# Patient Record
Sex: Male | Born: 1935 | Race: White | Hispanic: No | Smoking: Never smoker
Health system: Southern US, Community
[De-identification: ages and names within clinical notes are randomized; demographics above are authoritative.]

## PROBLEM LIST (undated history)

## (undated) DIAGNOSIS — N400 Enlarged prostate without lower urinary tract symptoms: Secondary | ICD-10-CM

## (undated) DIAGNOSIS — I959 Hypotension, unspecified: Secondary | ICD-10-CM

## (undated) DIAGNOSIS — I2510 Atherosclerotic heart disease of native coronary artery without angina pectoris: Secondary | ICD-10-CM

## (undated) DIAGNOSIS — C4490 Unspecified malignant neoplasm of skin, unspecified: Secondary | ICD-10-CM

## (undated) DIAGNOSIS — N200 Calculus of kidney: Secondary | ICD-10-CM

## (undated) DIAGNOSIS — J449 Chronic obstructive pulmonary disease, unspecified: Secondary | ICD-10-CM

## (undated) DIAGNOSIS — E039 Hypothyroidism, unspecified: Secondary | ICD-10-CM

## (undated) DIAGNOSIS — L989 Disorder of the skin and subcutaneous tissue, unspecified: Secondary | ICD-10-CM

## (undated) DIAGNOSIS — D649 Anemia, unspecified: Secondary | ICD-10-CM

## (undated) DIAGNOSIS — J45909 Unspecified asthma, uncomplicated: Secondary | ICD-10-CM

## (undated) HISTORY — PX: APPENDECTOMY: SHX54

## (undated) HISTORY — DX: Calculus of kidney: N20.0

## (undated) HISTORY — PX: MOHS SURGERY: SUR867

## (undated) HISTORY — PX: TONSILLECTOMY: SUR1361

## (undated) HISTORY — PX: BONE RESECTION: SHX897

---

## 2006-04-17 ENCOUNTER — Ambulatory Visit: Payer: Self-pay

## 2006-05-11 ENCOUNTER — Emergency Department: Payer: Self-pay | Admitting: Emergency Medicine

## 2006-05-12 ENCOUNTER — Other Ambulatory Visit: Payer: Self-pay

## 2006-05-12 ENCOUNTER — Ambulatory Visit: Payer: Self-pay | Admitting: Emergency Medicine

## 2006-12-05 ENCOUNTER — Ambulatory Visit: Payer: Self-pay

## 2007-01-01 ENCOUNTER — Ambulatory Visit: Payer: Self-pay | Admitting: Specialist

## 2007-07-17 ENCOUNTER — Ambulatory Visit: Payer: Self-pay

## 2007-09-04 ENCOUNTER — Ambulatory Visit: Payer: Self-pay | Admitting: Family Medicine

## 2008-02-03 ENCOUNTER — Ambulatory Visit: Payer: Self-pay | Admitting: Unknown Physician Specialty

## 2008-11-16 ENCOUNTER — Ambulatory Visit: Payer: Self-pay | Admitting: Internal Medicine

## 2008-12-12 ENCOUNTER — Emergency Department: Payer: Self-pay | Admitting: Emergency Medicine

## 2009-01-24 ENCOUNTER — Ambulatory Visit: Payer: Self-pay | Admitting: Emergency Medicine

## 2009-02-21 ENCOUNTER — Ambulatory Visit: Payer: Self-pay | Admitting: Internal Medicine

## 2010-07-11 ENCOUNTER — Emergency Department: Payer: Self-pay | Admitting: Emergency Medicine

## 2010-07-12 ENCOUNTER — Emergency Department: Payer: Self-pay | Admitting: Emergency Medicine

## 2010-09-02 ENCOUNTER — Emergency Department: Payer: Self-pay | Admitting: Emergency Medicine

## 2010-12-07 ENCOUNTER — Ambulatory Visit: Payer: Self-pay | Admitting: Unknown Physician Specialty

## 2010-12-17 ENCOUNTER — Emergency Department: Payer: Self-pay | Admitting: Emergency Medicine

## 2011-09-23 ENCOUNTER — Ambulatory Visit: Payer: Self-pay | Admitting: Unknown Physician Specialty

## 2011-09-25 LAB — PATHOLOGY REPORT

## 2012-02-26 ENCOUNTER — Ambulatory Visit: Payer: Self-pay | Admitting: Neurology

## 2012-04-28 ENCOUNTER — Ambulatory Visit: Payer: Self-pay | Admitting: Internal Medicine

## 2012-12-23 ENCOUNTER — Ambulatory Visit: Payer: Self-pay | Admitting: Internal Medicine

## 2013-01-27 LAB — COMPREHENSIVE METABOLIC PANEL
Alkaline Phosphatase: 55 U/L (ref 50–136)
BUN: 27 mg/dL — ABNORMAL HIGH (ref 7–18)
Bilirubin,Total: 0.5 mg/dL (ref 0.2–1.0)
Calcium, Total: 8.4 mg/dL — ABNORMAL LOW (ref 8.5–10.1)
EGFR (African American): 32 — ABNORMAL LOW
Glucose: 127 mg/dL — ABNORMAL HIGH (ref 65–99)
Osmolality: 279 (ref 275–301)
SGOT(AST): 81 U/L — ABNORMAL HIGH (ref 15–37)
SGPT (ALT): 52 U/L (ref 12–78)
Sodium: 136 mmol/L (ref 136–145)
Total Protein: 7.5 g/dL (ref 6.4–8.2)

## 2013-01-27 LAB — URINALYSIS, COMPLETE
Bilirubin,UR: NEGATIVE
Blood: NEGATIVE
Glucose,UR: NEGATIVE mg/dL (ref 0–75)
Hyaline Cast: 13
Ketone: NEGATIVE
Protein: 30
RBC,UR: 2 /HPF (ref 0–5)
Specific Gravity: 1.019 (ref 1.003–1.030)
WBC UR: 4 /HPF (ref 0–5)

## 2013-01-27 LAB — CBC
HGB: 11.5 g/dL — ABNORMAL LOW (ref 13.0–18.0)
MCHC: 32.2 g/dL (ref 32.0–36.0)
Platelet: 158 10*3/uL (ref 150–440)

## 2013-01-27 LAB — TROPONIN I: Troponin-I: <0.02 ng/mL

## 2013-01-28 ENCOUNTER — Inpatient Hospital Stay: Payer: Self-pay | Admitting: Internal Medicine

## 2013-01-28 LAB — PRO B NATRIURETIC PEPTIDE: B-Type Natriuretic Peptide: 508 pg/mL — ABNORMAL HIGH (ref 0–450)

## 2013-01-28 LAB — CK TOTAL AND CKMB (NOT AT ARMC)
CK, Total: 1069 U/L — ABNORMAL HIGH (ref 35–232)
CK-MB: 5.5 ng/mL — ABNORMAL HIGH (ref 0.5–3.6)

## 2013-01-28 LAB — TROPONIN I: Troponin-I: <0.02 ng/mL

## 2013-01-29 LAB — CREATININE, SERUM
Creatinine: 1.47 mg/dL — ABNORMAL HIGH
EGFR (African American): 52 — ABNORMAL LOW
EGFR (Non-African Amer.): 45 — ABNORMAL LOW

## 2013-01-29 LAB — CBC WITH DIFFERENTIAL/PLATELET
Basophil #: 0.1 10*3/uL (ref 0.0–0.1)
Eosinophil %: 10.6 %
HCT: 34.6 % — ABNORMAL LOW (ref 40.0–52.0)
HGB: 11.4 g/dL — ABNORMAL LOW (ref 13.0–18.0)
Lymphocyte %: 9.4 %
MCH: 32.3 pg (ref 26.0–34.0)
MCHC: 32.9 g/dL (ref 32.0–36.0)
Monocyte #: 0.6 x10 3/mm (ref 0.2–1.0)
Neutrophil #: 4.2 10*3/uL (ref 1.4–6.5)
Neutrophil %: 69.7 %
Platelet: 148 10*3/uL — ABNORMAL LOW (ref 150–440)
WBC: 6.1 10*3/uL (ref 3.8–10.6)

## 2013-01-29 LAB — BASIC METABOLIC PANEL
Anion Gap: 6 — ABNORMAL LOW (ref 7–16)
Calcium, Total: 8.5 mg/dL (ref 8.5–10.1)
Chloride: 105 mmol/L (ref 98–107)
EGFR (Non-African Amer.): 43 — ABNORMAL LOW
Glucose: 98 mg/dL (ref 65–99)
Osmolality: 279 (ref 275–301)

## 2013-01-29 LAB — PROTIME-INR: Prothrombin Time: 14.3 secs (ref 11.5–14.7)

## 2013-01-30 LAB — CBC WITH DIFFERENTIAL/PLATELET
Basophil #: 0 10*3/uL (ref 0.0–0.1)
Basophil %: 0.2 %
Eosinophil #: 0 10*3/uL (ref 0.0–0.7)
Eosinophil %: 0.1 %
HCT: 34.8 % — ABNORMAL LOW (ref 40.0–52.0)
HGB: 11.2 g/dL — ABNORMAL LOW (ref 13.0–18.0)
Lymphocyte %: 8.6 %
MCHC: 32.3 g/dL (ref 32.0–36.0)
MCV: 98 fL (ref 80–100)
Monocyte #: 0.1 x10 3/mm — ABNORMAL LOW (ref 0.2–1.0)
Neutrophil #: 4.1 10*3/uL (ref 1.4–6.5)
Neutrophil %: 89.1 %
Platelet: 142 10*3/uL — ABNORMAL LOW (ref 150–440)
RDW: 16.7 % — ABNORMAL HIGH (ref 11.5–14.5)

## 2013-01-30 LAB — BASIC METABOLIC PANEL
Chloride: 104 mmol/L (ref 98–107)
EGFR (Non-African Amer.): 52 — ABNORMAL LOW
Osmolality: 281 (ref 275–301)
Potassium: 4.3 mmol/L (ref 3.5–5.1)

## 2013-03-08 ENCOUNTER — Inpatient Hospital Stay: Payer: Self-pay | Admitting: Internal Medicine

## 2013-03-08 LAB — CBC
HCT: 33.6 % — ABNORMAL LOW (ref 40.0–52.0)
MCH: 33.1 pg (ref 26.0–34.0)
MCHC: 33.8 g/dL (ref 32.0–36.0)
MCV: 98 fL (ref 80–100)
Platelet: 152 10*3/uL (ref 150–440)
RBC: 3.42 10*6/uL — ABNORMAL LOW (ref 4.40–5.90)
RDW: 15.4 % — ABNORMAL HIGH (ref 11.5–14.5)

## 2013-03-08 LAB — COMPREHENSIVE METABOLIC PANEL
Albumin: 3.9 g/dL (ref 3.4–5.0)
Alkaline Phosphatase: 59 U/L (ref 50–136)
Anion Gap: 6 — ABNORMAL LOW (ref 7–16)
BUN: 26 mg/dL — ABNORMAL HIGH (ref 7–18)
Calcium, Total: 8.1 mg/dL — ABNORMAL LOW (ref 8.5–10.1)
Co2: 30 mmol/L (ref 21–32)
Creatinine: 1.93 mg/dL — ABNORMAL HIGH (ref 0.60–1.30)
EGFR (African American): 38 — ABNORMAL LOW
EGFR (Non-African Amer.): 32 — ABNORMAL LOW
Osmolality: 280 (ref 275–301)
Sodium: 138 mmol/L (ref 136–145)

## 2013-03-08 LAB — TROPONIN I: Troponin-I: <0.02 ng/mL

## 2013-03-09 LAB — URINALYSIS, COMPLETE
Bacteria: NONE SEEN
Blood: NEGATIVE
Protein: NEGATIVE
RBC,UR: 1 /HPF (ref 0–5)
Squamous Epithelial: NONE SEEN
WBC UR: <1 /HPF (ref 0–5)

## 2013-03-09 LAB — BASIC METABOLIC PANEL
BUN: 22 mg/dL — ABNORMAL HIGH (ref 7–18)
Calcium, Total: 8.2 mg/dL — ABNORMAL LOW (ref 8.5–10.1)
Co2: 29 mmol/L (ref 21–32)
EGFR (African American): 49 — ABNORMAL LOW
Glucose: 75 mg/dL (ref 65–99)
Osmolality: 278 (ref 275–301)
Sodium: 138 mmol/L (ref 136–145)

## 2013-03-09 LAB — CK TOTAL AND CKMB (NOT AT ARMC)
CK, Total: 2295 U/L — ABNORMAL HIGH (ref 35–232)
CK-MB: 16.3 ng/mL — ABNORMAL HIGH (ref 0.5–3.6)

## 2013-03-09 LAB — CK-MB: CK-MB: 16.3 ng/mL — ABNORMAL HIGH (ref 0.5–3.6)

## 2013-03-10 LAB — CK: CK, Total: 1830 U/L — ABNORMAL HIGH (ref 35–232)

## 2013-03-10 LAB — BASIC METABOLIC PANEL
Anion Gap: 8 (ref 7–16)
Calcium, Total: 7.8 mg/dL — ABNORMAL LOW (ref 8.5–10.1)
Chloride: 107 mmol/L (ref 98–107)
EGFR (African American): >60
Glucose: 73 mg/dL (ref 65–99)
Osmolality: 281 (ref 275–301)
Potassium: 4.3 mmol/L (ref 3.5–5.1)
Sodium: 141 mmol/L (ref 136–145)

## 2013-03-10 LAB — CBC WITH DIFFERENTIAL/PLATELET
Basophil #: 0.1 10*3/uL (ref 0.0–0.1)
Basophil %: 2.4 %
Eosinophil %: 13.8 %
HCT: 32.9 % — ABNORMAL LOW (ref 40.0–52.0)
Lymphocyte #: 1.2 10*3/uL (ref 1.0–3.6)
MCH: 32.3 pg (ref 26.0–34.0)
MCHC: 32.7 g/dL (ref 32.0–36.0)
Monocyte #: 0.2 x10 3/mm (ref 0.2–1.0)
Monocyte %: 6.8 %
Platelet: 139 10*3/uL — ABNORMAL LOW (ref 150–440)
RBC: 3.34 10*6/uL — ABNORMAL LOW (ref 4.40–5.90)

## 2013-03-11 LAB — CBC WITH DIFFERENTIAL/PLATELET
Basophil #: 0.1 10*3/uL (ref 0.0–0.1)
Basophil %: 2.3 %
Eosinophil #: 0.6 10*3/uL (ref 0.0–0.7)
Eosinophil %: 13.9 %
HCT: 35.5 % — ABNORMAL LOW (ref 40.0–52.0)
Lymphocyte %: 37 %
MCH: 34 pg (ref 26.0–34.0)
MCHC: 33.6 g/dL (ref 32.0–36.0)
Monocyte #: 0.3 x10 3/mm (ref 0.2–1.0)
Monocyte %: 6.2 %
Neutrophil %: 40.6 %
Platelet: 147 10*3/uL — ABNORMAL LOW (ref 150–440)
RDW: 15.2 % — ABNORMAL HIGH (ref 11.5–14.5)

## 2013-03-12 LAB — CK: CK, Total: 1678 U/L — ABNORMAL HIGH (ref 35–232)

## 2013-03-15 ENCOUNTER — Emergency Department: Payer: Self-pay | Admitting: Emergency Medicine

## 2013-04-15 ENCOUNTER — Emergency Department: Payer: Self-pay | Admitting: Emergency Medicine

## 2013-05-21 IMAGING — CR DG CHEST 1V PORT
1 series · 2 of 2 positions shown · non-contrast
Comparison: none

REASON FOR EXAM: dyspnea
COMMENTS:

PROCEDURE:     DXR - DXR PORTABLE CHEST SINGLE VIEW  - March 08, 2013  [DATE]
RESULT:     Comparison: 01/27/2013

[Series 1: ap · 0.17mm/px · 2 of 2 slices shown]
[im 1/2]
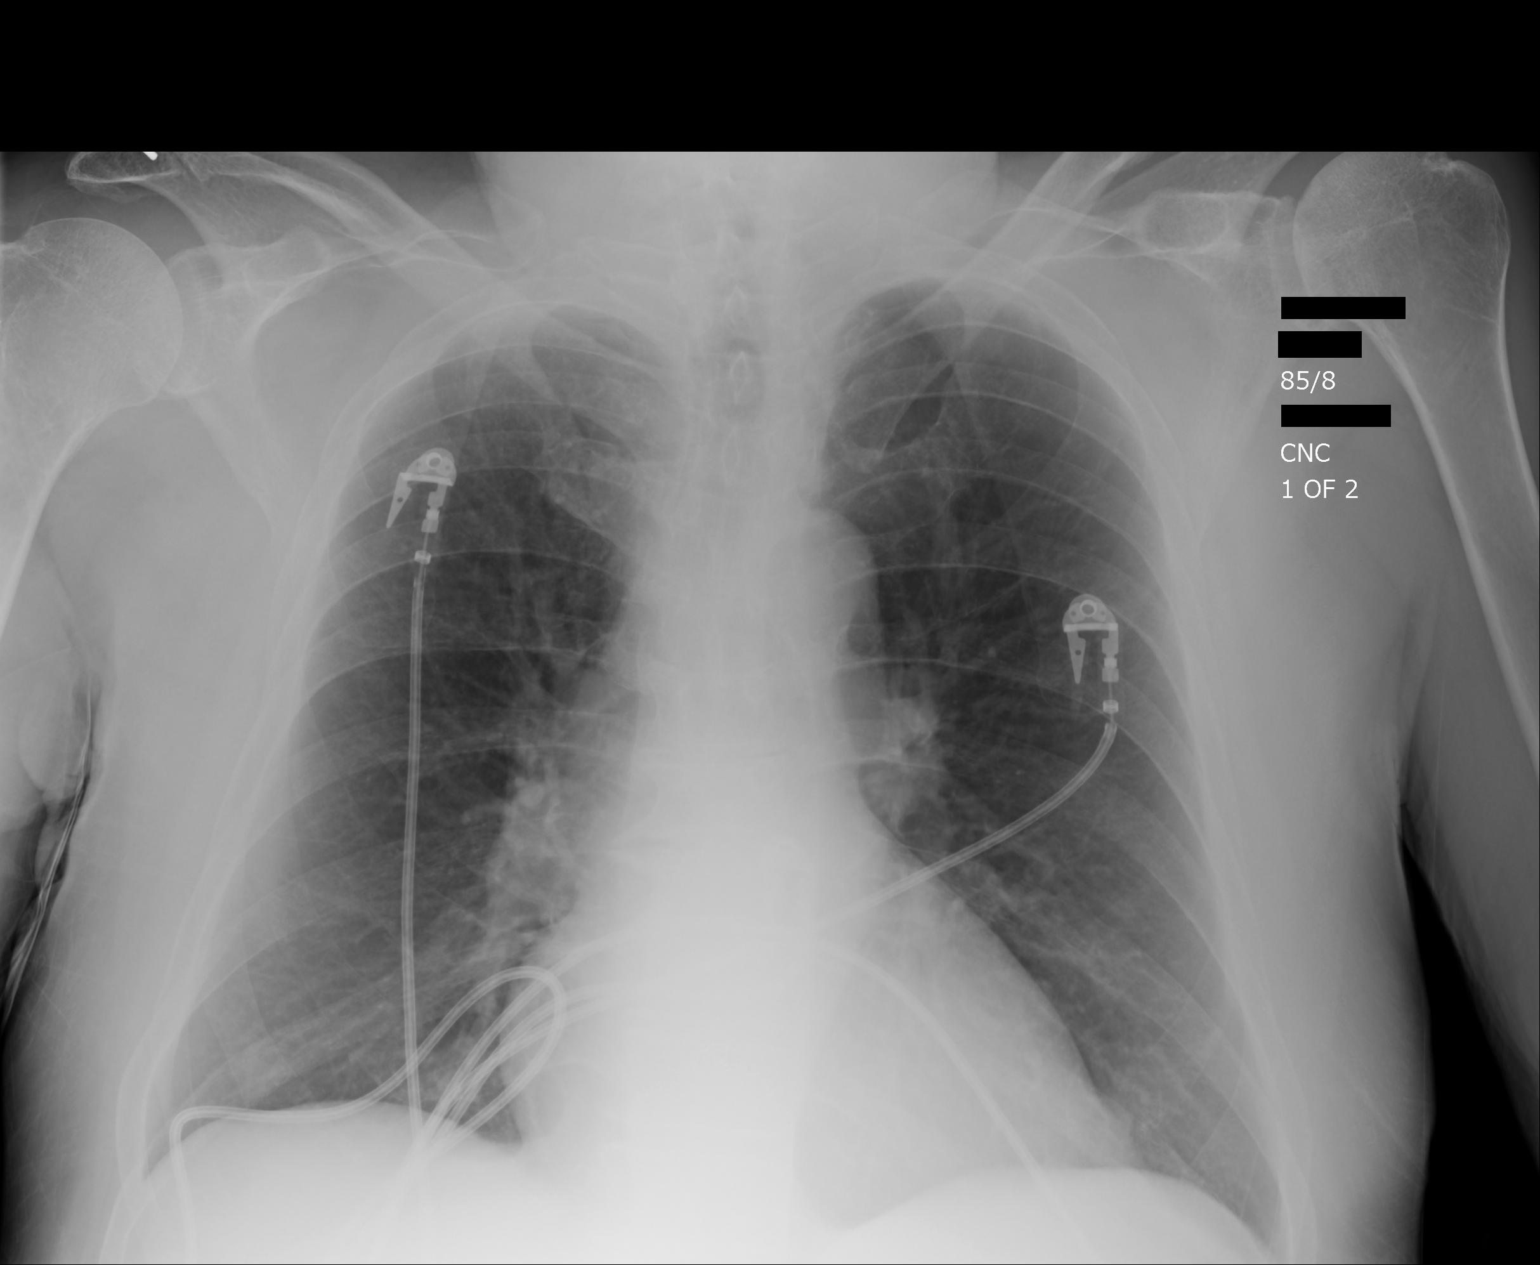
[im 2/2]
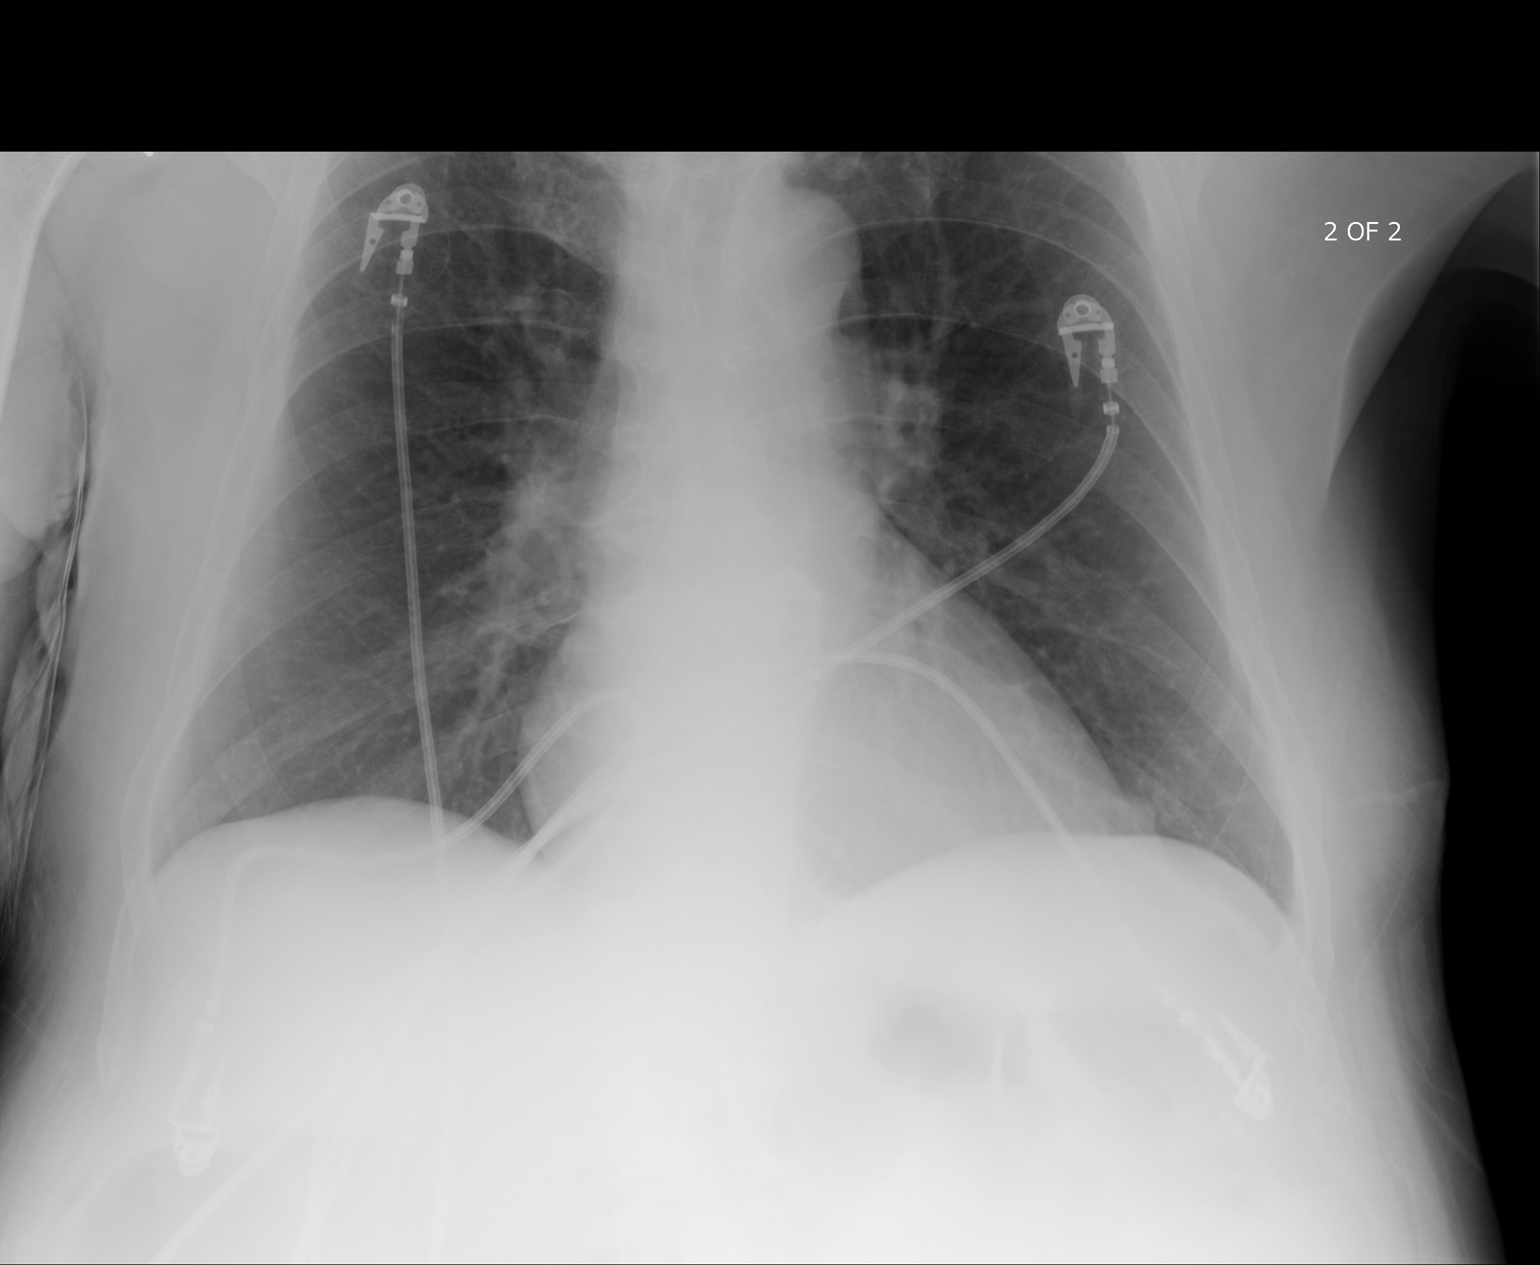

[2 of 2 positions shown; findings below may reference images not displayed]

FINDINGS: Single portable AP chest radiograph is provided.  There is no focal
parenchymal opacity, pleural effusion, or pneumothorax. Normal
cardiomediastinal silhouette. The osseous structures are unremarkable.
IMPRESSION: No acute disease of the che[REDACTED]

## 2013-06-17 ENCOUNTER — Ambulatory Visit: Payer: Self-pay | Admitting: Hematology and Oncology

## 2013-06-29 IMAGING — CT CT HEAD WITHOUT CONTRAST
2 series · 15 of 30 positions shown, 19 images · non-contrast
Comparison: none

REASON FOR EXAM: head trauma
COMMENTS:

PROCEDURE:     CT  - CT HEAD WITHOUT CONTRAST  - April 16, 2013  [DATE]
RESULT:     Comparison:  03/08/2013
TECHNIQUE: Multiple axial images from the foramen magnum to the vertex were
obtained without IV contrast.

[Series 2: without · axial · non-contrast · 0.44mm/px · z∈[-88,+38]mm · 13 of 31 slices shown, 17 images]
[im 3/31  brain]
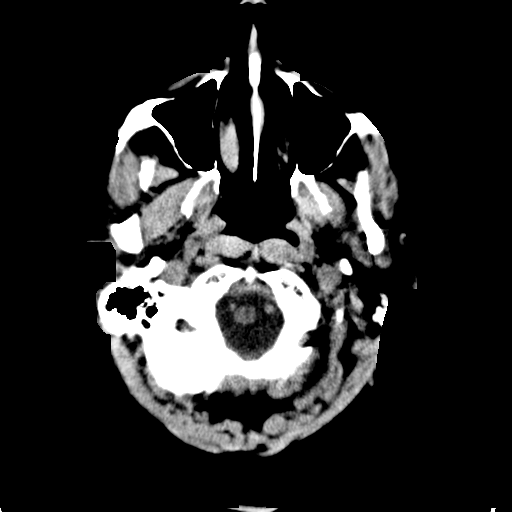
[im 3/31  bone]
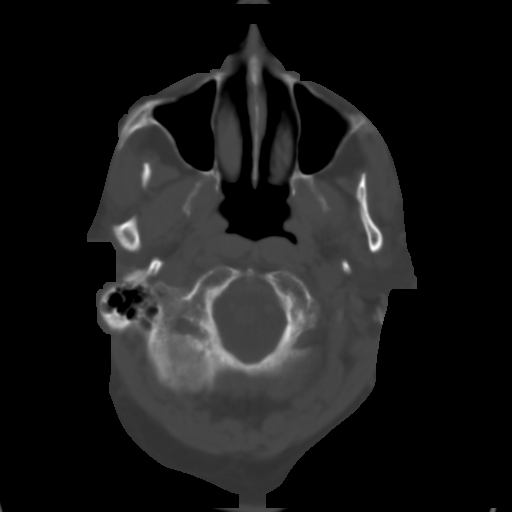
[im 5/31  brain]
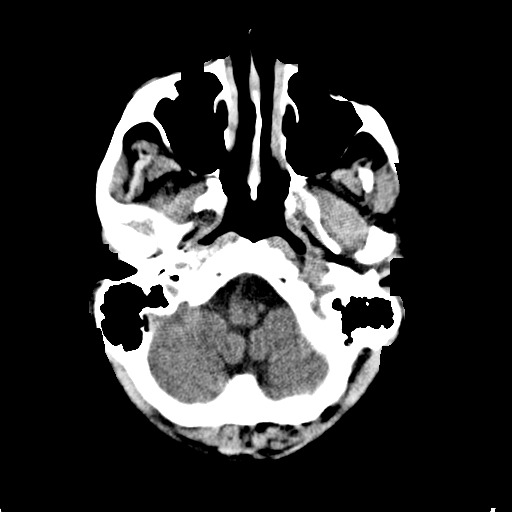
[im 7/31  brain]
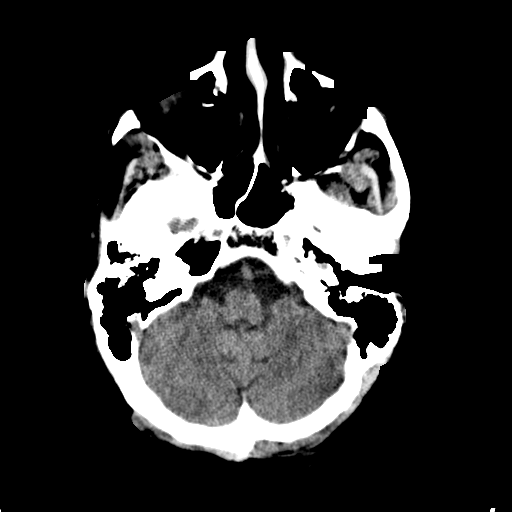
[im 9/31  brain]
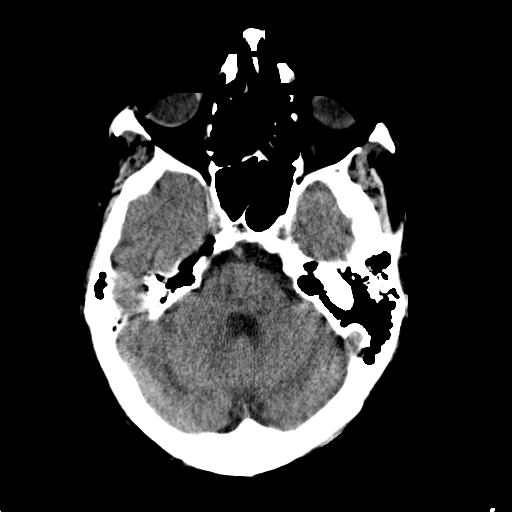
[im 11/31  brain]
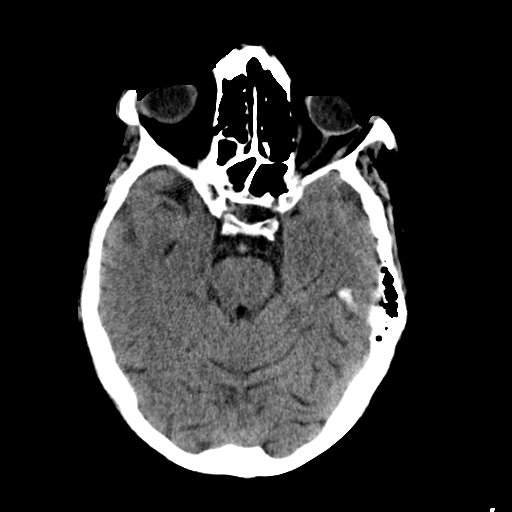
[im 11/31  bone]
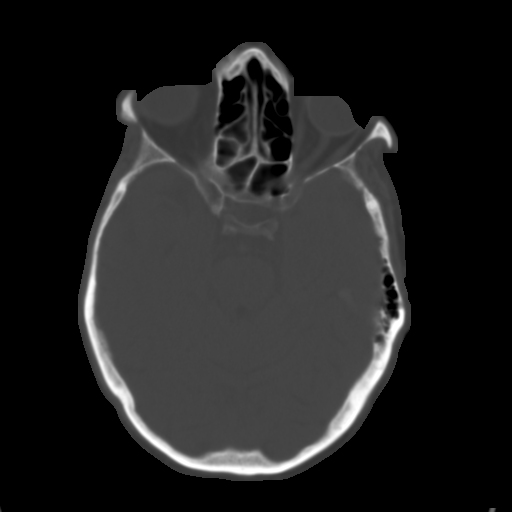
[im 13/31  brain]
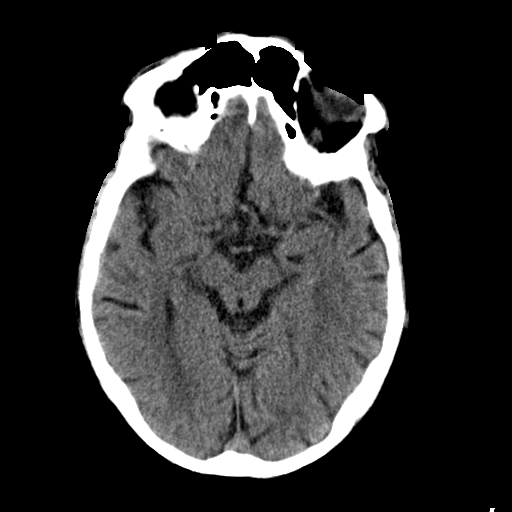
[im 16/31  brain]
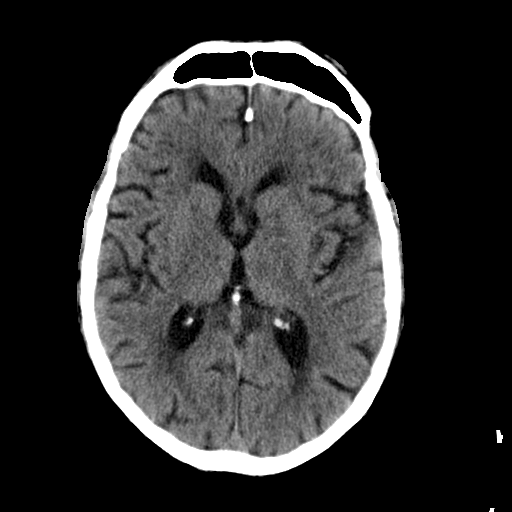
[im 18/31  brain]
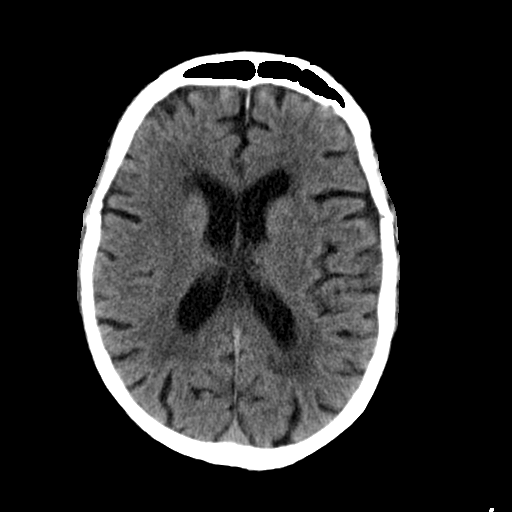
[im 20/31  brain]
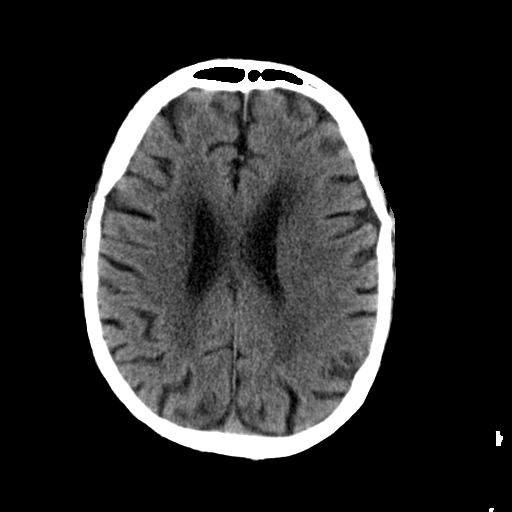
[im 20/31  bone]
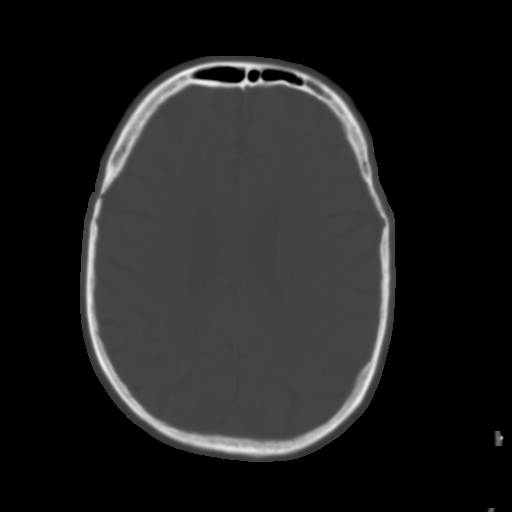
[im 22/31  brain]
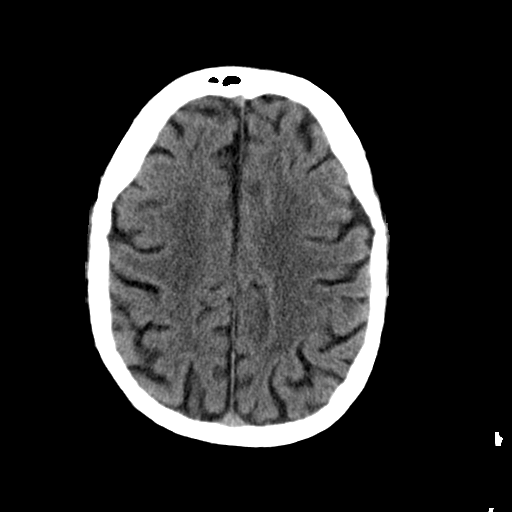
[im 24/31  brain]
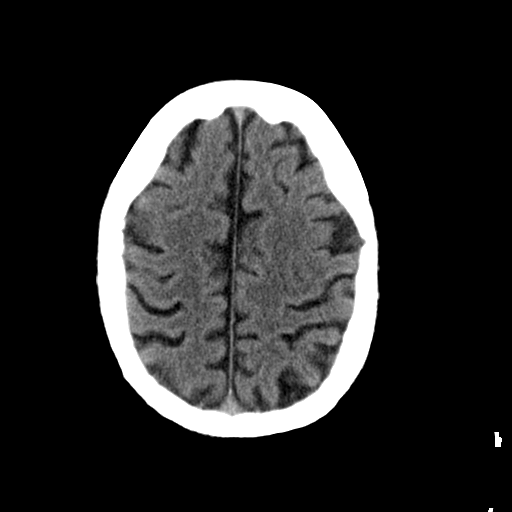
[im 26/31  brain]
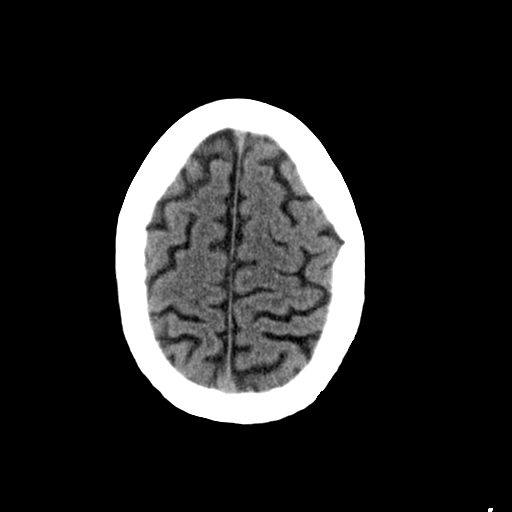
[im 28/31  brain]
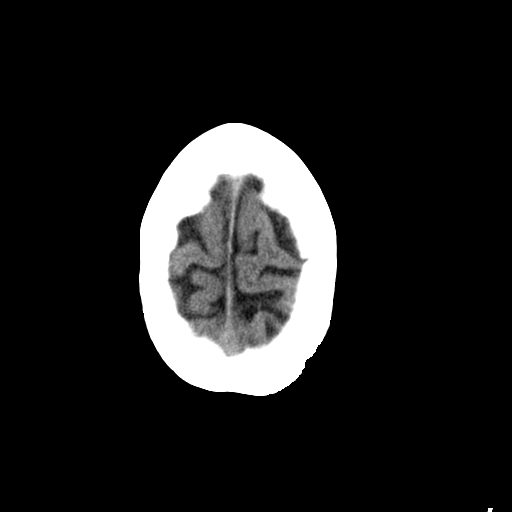
[im 28/31  bone]
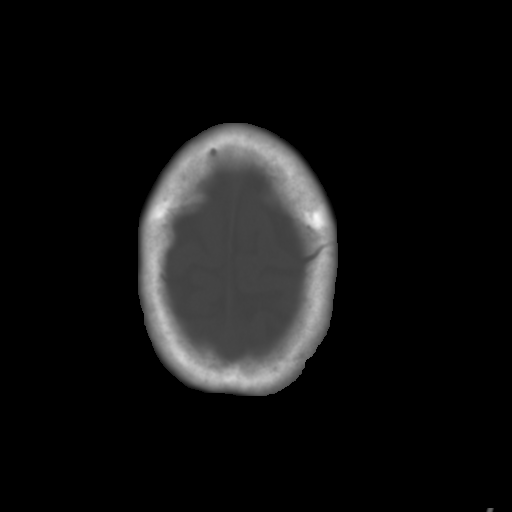

[Series 3: bone · axial · 0.44mm/px · z∈[-88,-68]mm · 2 of 31 slices shown]
[im 3/31  bone]
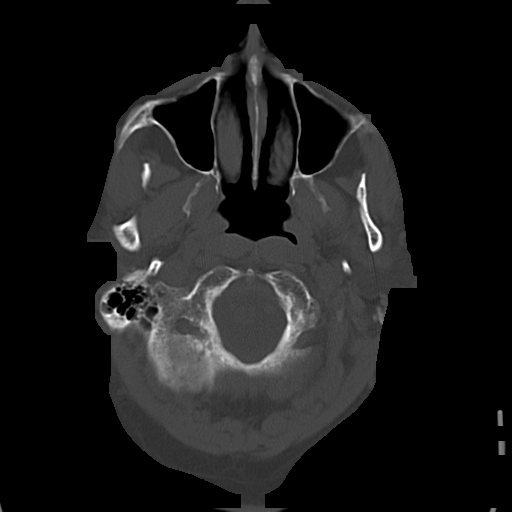
[im 7/31  bone]
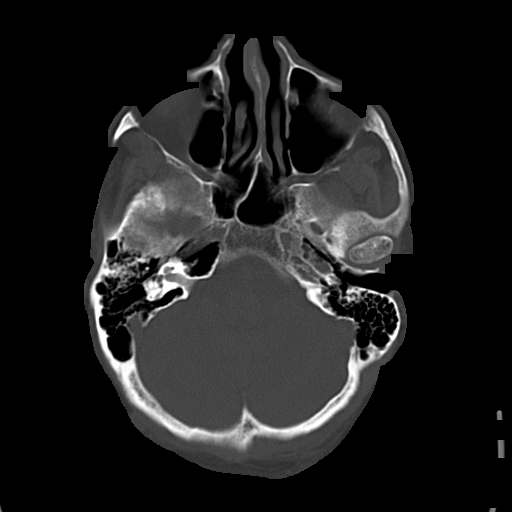

[15 of 30 positions shown; findings below may reference images not displayed]

FINDINGS: There is no evidence of mass effect, midline shift, or extra-axial fluid
collections.  There is no evidence of a space-occupying lesion or
intracranial hemorrhage. There is no evidence of a cortical-based area of
acute infarction. There is generalized cerebral atrophy. There is
periventricular white matter low attenuation likely secondary to
microangiopathy.

The ventricles and sulci are appropriate for the patient's age. The basal
cisterns are patent.

Visualized portions of the orbits are unremarkable. The visualized portions
of the paranasal sinuses and mastoid air cells are unremarkable.
Cerebrovascular atherosclerotic calcifications are noted.

The osseous structures are unremarkable.
IMPRESSION: No acute intracranial process.

[REDACTED]

## 2013-09-13 ENCOUNTER — Encounter (INDEPENDENT_AMBULATORY_CARE_PROVIDER_SITE_OTHER): Payer: Medicare Other | Admitting: Ophthalmology

## 2013-09-13 DIAGNOSIS — D3130 Benign neoplasm of unspecified choroid: Secondary | ICD-10-CM

## 2013-09-13 DIAGNOSIS — I10 Essential (primary) hypertension: Secondary | ICD-10-CM

## 2013-09-13 DIAGNOSIS — H35039 Hypertensive retinopathy, unspecified eye: Secondary | ICD-10-CM

## 2013-09-13 DIAGNOSIS — H2510 Age-related nuclear cataract, unspecified eye: Secondary | ICD-10-CM

## 2013-09-13 DIAGNOSIS — H43819 Vitreous degeneration, unspecified eye: Secondary | ICD-10-CM

## 2013-10-20 ENCOUNTER — Encounter: Payer: Self-pay | Admitting: Neurology

## 2013-10-26 ENCOUNTER — Ambulatory Visit: Payer: Self-pay | Admitting: Neurology

## 2013-11-08 ENCOUNTER — Encounter: Payer: Self-pay | Admitting: Neurology

## 2013-12-09 ENCOUNTER — Encounter: Payer: Self-pay | Admitting: Neurology

## 2014-01-18 ENCOUNTER — Emergency Department: Payer: Self-pay | Admitting: Emergency Medicine

## 2014-01-18 LAB — COMPREHENSIVE METABOLIC PANEL
ALT: 37 U/L (ref 12–78)
ANION GAP: 4 — AB (ref 7–16)
Albumin: 3.6 g/dL (ref 3.4–5.0)
Alkaline Phosphatase: 74 U/L
BILIRUBIN TOTAL: 0.6 mg/dL (ref 0.2–1.0)
BUN: 23 mg/dL — ABNORMAL HIGH (ref 7–18)
CREATININE: 1.25 mg/dL (ref 0.60–1.30)
Calcium, Total: 8.4 mg/dL — ABNORMAL LOW (ref 8.5–10.1)
Chloride: 104 mmol/L (ref 98–107)
Co2: 31 mmol/L (ref 21–32)
GFR CALC NON AF AMER: 54 — AB
Glucose: 131 mg/dL — ABNORMAL HIGH (ref 65–99)
Osmolality: 283 (ref 275–301)
Potassium: 4.9 mmol/L (ref 3.5–5.1)
SGOT(AST): 33 U/L (ref 15–37)
Sodium: 139 mmol/L (ref 136–145)
Total Protein: 7.3 g/dL (ref 6.4–8.2)

## 2014-01-18 LAB — URINALYSIS, COMPLETE
BLOOD: NEGATIVE
Bacteria: NONE SEEN
Bilirubin,UR: NEGATIVE
Glucose,UR: NEGATIVE mg/dL (ref 0–75)
KETONE: NEGATIVE
Leukocyte Esterase: NEGATIVE
Nitrite: NEGATIVE
PH: 7 (ref 4.5–8.0)
Protein: NEGATIVE
Specific Gravity: 1.019 (ref 1.003–1.030)
Squamous Epithelial: NONE SEEN

## 2014-01-18 LAB — CBC WITH DIFFERENTIAL/PLATELET
BASOS ABS: 0.1 10*3/uL (ref 0.0–0.1)
BASOS PCT: 0.7 %
EOS PCT: 1.6 %
Eosinophil #: 0.1 10*3/uL (ref 0.0–0.7)
HCT: 39.2 % — ABNORMAL LOW (ref 40.0–52.0)
HGB: 13.2 g/dL (ref 13.0–18.0)
LYMPHS ABS: 0.1 10*3/uL — AB (ref 1.0–3.6)
LYMPHS PCT: 1.4 %
MCH: 33 pg (ref 26.0–34.0)
MCHC: 33.8 g/dL (ref 32.0–36.0)
MCV: 98 fL (ref 80–100)
Monocyte #: 0.4 x10 3/mm (ref 0.2–1.0)
Monocyte %: 3.9 %
NEUTROS ABS: 8.4 10*3/uL — AB (ref 1.4–6.5)
NEUTROS PCT: 92.4 %
Platelet: 160 10*3/uL (ref 150–440)
RBC: 4 10*6/uL — ABNORMAL LOW (ref 4.40–5.90)
RDW: 14 % (ref 11.5–14.5)
WBC: 9.1 10*3/uL (ref 3.8–10.6)

## 2014-01-18 LAB — LIPASE, BLOOD: LIPASE: 148 U/L (ref 73–393)

## 2014-01-18 LAB — TROPONIN I

## 2014-02-19 ENCOUNTER — Emergency Department: Payer: Self-pay | Admitting: Emergency Medicine

## 2014-02-19 LAB — BASIC METABOLIC PANEL
ANION GAP: 3 — AB (ref 7–16)
BUN: 12 mg/dL (ref 7–18)
CALCIUM: 8.8 mg/dL (ref 8.5–10.1)
CHLORIDE: 104 mmol/L (ref 98–107)
CREATININE: 1.16 mg/dL (ref 0.60–1.30)
Co2: 31 mmol/L (ref 21–32)
EGFR (Non-African Amer.): 60 — ABNORMAL LOW
Glucose: 99 mg/dL (ref 65–99)
OSMOLALITY: 275 (ref 275–301)
Potassium: 4.3 mmol/L (ref 3.5–5.1)
Sodium: 138 mmol/L (ref 136–145)

## 2014-02-19 LAB — CBC
HCT: 39 % — ABNORMAL LOW (ref 40.0–52.0)
HGB: 12.3 g/dL — ABNORMAL LOW (ref 13.0–18.0)
MCH: 31.1 pg (ref 26.0–34.0)
MCHC: 31.5 g/dL — AB (ref 32.0–36.0)
MCV: 99 fL (ref 80–100)
Platelet: 225 10*3/uL (ref 150–440)
RBC: 3.95 10*6/uL — ABNORMAL LOW (ref 4.40–5.90)
RDW: 14.5 % (ref 11.5–14.5)
WBC: 4.5 10*3/uL (ref 3.8–10.6)

## 2014-02-19 LAB — PRO B NATRIURETIC PEPTIDE: B-Type Natriuretic Peptide: 174 pg/mL (ref 0–450)

## 2014-02-19 LAB — TROPONIN I: Troponin-I: <0.02 ng/mL

## 2014-02-25 ENCOUNTER — Ambulatory Visit: Payer: Self-pay | Admitting: Internal Medicine

## 2014-02-25 LAB — CULTURE, BLOOD (SINGLE)

## 2014-03-20 ENCOUNTER — Observation Stay: Payer: Self-pay | Admitting: Internal Medicine

## 2014-03-20 LAB — CBC
HCT: 36.6 % — ABNORMAL LOW (ref 40.0–52.0)
HGB: 11.7 g/dL — ABNORMAL LOW (ref 13.0–18.0)
MCH: 32.1 pg (ref 26.0–34.0)
MCHC: 32.1 g/dL (ref 32.0–36.0)
MCV: 100 fL (ref 80–100)
PLATELETS: 196 10*3/uL (ref 150–440)
RBC: 3.65 10*6/uL — AB (ref 4.40–5.90)
RDW: 14.7 % — ABNORMAL HIGH (ref 11.5–14.5)
WBC: 10.6 10*3/uL (ref 3.8–10.6)

## 2014-03-20 LAB — COMPREHENSIVE METABOLIC PANEL
ANION GAP: 5 — AB (ref 7–16)
Albumin: 3.6 g/dL (ref 3.4–5.0)
Alkaline Phosphatase: 56 U/L
BUN: 28 mg/dL — ABNORMAL HIGH (ref 7–18)
Bilirubin,Total: 0.3 mg/dL (ref 0.2–1.0)
Calcium, Total: 8.6 mg/dL (ref 8.5–10.1)
Chloride: 103 mmol/L (ref 98–107)
Co2: 32 mmol/L (ref 21–32)
Creatinine: 1.24 mg/dL (ref 0.60–1.30)
EGFR (Non-African Amer.): 55 — ABNORMAL LOW
GLUCOSE: 97 mg/dL (ref 65–99)
Osmolality: 285 (ref 275–301)
Potassium: 3.7 mmol/L (ref 3.5–5.1)
SGOT(AST): 21 U/L (ref 15–37)
SGPT (ALT): 26 U/L (ref 12–78)
SODIUM: 140 mmol/L (ref 136–145)
Total Protein: 7.1 g/dL (ref 6.4–8.2)

## 2014-03-20 LAB — RAPID INFLUENZA A&B ANTIGENS

## 2014-03-20 LAB — TROPONIN I

## 2014-03-20 LAB — PRO B NATRIURETIC PEPTIDE: B-Type Natriuretic Peptide: 193 pg/mL (ref 0–450)

## 2014-03-20 LAB — TSH: Thyroid Stimulating Horm: 18.6 u[IU]/mL — ABNORMAL HIGH

## 2014-03-21 LAB — CBC WITH DIFFERENTIAL/PLATELET
Basophil #: 0 10*3/uL (ref 0.0–0.1)
Basophil %: 0.1 %
EOS ABS: 0 10*3/uL (ref 0.0–0.7)
Eosinophil %: 0 %
HCT: 34 % — AB (ref 40.0–52.0)
HGB: 10.8 g/dL — ABNORMAL LOW (ref 13.0–18.0)
Lymphocyte #: 0.2 10*3/uL — ABNORMAL LOW (ref 1.0–3.6)
Lymphocyte %: 2.4 %
MCH: 32.3 pg (ref 26.0–34.0)
MCHC: 31.7 g/dL — AB (ref 32.0–36.0)
MCV: 102 fL — ABNORMAL HIGH (ref 80–100)
Monocyte #: 0.2 x10 3/mm (ref 0.2–1.0)
Monocyte %: 2.5 %
NEUTROS PCT: 95 %
Neutrophil #: 7.7 10*3/uL — ABNORMAL HIGH (ref 1.4–6.5)
Platelet: 182 10*3/uL (ref 150–440)
RBC: 3.34 10*6/uL — ABNORMAL LOW (ref 4.40–5.90)
RDW: 14.9 % — ABNORMAL HIGH (ref 11.5–14.5)
WBC: 8.1 10*3/uL (ref 3.8–10.6)

## 2014-03-21 LAB — BASIC METABOLIC PANEL
Anion Gap: 5 — ABNORMAL LOW (ref 7–16)
BUN: 24 mg/dL — ABNORMAL HIGH (ref 7–18)
CHLORIDE: 108 mmol/L — AB (ref 98–107)
Calcium, Total: 8.2 mg/dL — ABNORMAL LOW (ref 8.5–10.1)
Co2: 29 mmol/L (ref 21–32)
Creatinine: 1.09 mg/dL (ref 0.60–1.30)
EGFR (Non-African Amer.): >60
Glucose: 174 mg/dL — ABNORMAL HIGH (ref 65–99)
Osmolality: 291 (ref 275–301)
POTASSIUM: 4.5 mmol/L (ref 3.5–5.1)
SODIUM: 142 mmol/L (ref 136–145)

## 2014-03-22 LAB — BASIC METABOLIC PANEL
Anion Gap: 3 — ABNORMAL LOW (ref 7–16)
BUN: 28 mg/dL — ABNORMAL HIGH (ref 7–18)
CALCIUM: 8.4 mg/dL — AB (ref 8.5–10.1)
Chloride: 107 mmol/L (ref 98–107)
Co2: 30 mmol/L (ref 21–32)
Creatinine: 1.03 mg/dL (ref 0.60–1.30)
Glucose: 151 mg/dL — ABNORMAL HIGH (ref 65–99)
OSMOLALITY: 288 (ref 275–301)
Potassium: 4.7 mmol/L (ref 3.5–5.1)
SODIUM: 140 mmol/L (ref 136–145)

## 2014-03-22 LAB — CBC WITH DIFFERENTIAL/PLATELET
BASOS ABS: 0 10*3/uL (ref 0.0–0.1)
BASOS PCT: 0.1 %
Eosinophil #: 0 10*3/uL (ref 0.0–0.7)
Eosinophil %: 0 %
HCT: 34.5 % — AB (ref 40.0–52.0)
HGB: 11.3 g/dL — ABNORMAL LOW (ref 13.0–18.0)
LYMPHS PCT: 4.5 %
Lymphocyte #: 0.4 10*3/uL — ABNORMAL LOW (ref 1.0–3.6)
MCH: 33.4 pg (ref 26.0–34.0)
MCHC: 32.9 g/dL (ref 32.0–36.0)
MCV: 102 fL — ABNORMAL HIGH (ref 80–100)
MONOS PCT: 5.6 %
Monocyte #: 0.5 x10 3/mm (ref 0.2–1.0)
NEUTROS ABS: 8.3 10*3/uL — AB (ref 1.4–6.5)
Neutrophil %: 89.8 %
PLATELETS: 185 10*3/uL (ref 150–440)
RBC: 3.4 10*6/uL — AB (ref 4.40–5.90)
RDW: 15.1 % — ABNORMAL HIGH (ref 11.5–14.5)
WBC: 9.2 10*3/uL (ref 3.8–10.6)

## 2014-08-29 ENCOUNTER — Ambulatory Visit: Payer: Self-pay | Admitting: Internal Medicine

## 2014-10-25 ENCOUNTER — Ambulatory Visit: Payer: Self-pay

## 2014-11-21 ENCOUNTER — Emergency Department: Payer: Self-pay | Admitting: Emergency Medicine

## 2014-12-04 LAB — WOUND AEROBIC CULTURE

## 2014-12-06 ENCOUNTER — Encounter: Payer: Self-pay | Admitting: General Surgery

## 2014-12-09 ENCOUNTER — Encounter: Payer: Self-pay | Admitting: General Surgery

## 2014-12-23 ENCOUNTER — Emergency Department: Payer: Self-pay | Admitting: Emergency Medicine

## 2014-12-24 ENCOUNTER — Emergency Department: Payer: Self-pay | Admitting: Emergency Medicine

## 2014-12-24 LAB — BASIC METABOLIC PANEL
Anion Gap: 7 (ref 7–16)
BUN: 20 mg/dL — ABNORMAL HIGH (ref 7–18)
CALCIUM: 8.8 mg/dL (ref 8.5–10.1)
CHLORIDE: 102 mmol/L (ref 98–107)
Co2: 30 mmol/L (ref 21–32)
Creatinine: 1.33 mg/dL — ABNORMAL HIGH (ref 0.60–1.30)
EGFR (African American): >60
EGFR (Non-African Amer.): 55 — ABNORMAL LOW
GLUCOSE: 90 mg/dL (ref 65–99)
Osmolality: 280 (ref 275–301)
Potassium: 4.3 mmol/L (ref 3.5–5.1)
Sodium: 139 mmol/L (ref 136–145)

## 2014-12-24 LAB — CBC
HCT: 37.9 % — ABNORMAL LOW (ref 40.0–52.0)
HGB: 12.5 g/dL — AB (ref 13.0–18.0)
MCH: 32.6 pg (ref 26.0–34.0)
MCHC: 33 g/dL (ref 32.0–36.0)
MCV: 99 fL (ref 80–100)
Platelet: 214 10*3/uL (ref 150–440)
RBC: 3.83 10*6/uL — ABNORMAL LOW (ref 4.40–5.90)
RDW: 13.1 % (ref 11.5–14.5)
WBC: 9.1 10*3/uL (ref 3.8–10.6)

## 2014-12-24 LAB — URINALYSIS, COMPLETE
Bacteria: NONE SEEN
Bilirubin,UR: NEGATIVE
Blood: NEGATIVE
Glucose,UR: NEGATIVE mg/dL (ref 0–75)
Hyaline Cast: 2
Leukocyte Esterase: NEGATIVE
NITRITE: NEGATIVE
PROTEIN: NEGATIVE
Ph: 6 (ref 4.5–8.0)
RBC,UR: <1 /HPF (ref 0–5)
SPECIFIC GRAVITY: 1.018 (ref 1.003–1.030)
Squamous Epithelial: <1
WBC UR: 3 /HPF (ref 0–5)

## 2015-01-03 ENCOUNTER — Encounter: Payer: Self-pay | Admitting: Surgery

## 2015-01-09 ENCOUNTER — Encounter: Payer: Self-pay | Admitting: Surgery

## 2015-02-07 ENCOUNTER — Encounter
Admit: 2015-02-07 | Disposition: A | Discharge status: Home / Self Care | Payer: Self-pay | Attending: Surgery | Admitting: Surgery

## 2015-02-07 ENCOUNTER — Encounter
Admit: 2015-02-07 | Disposition: A | Discharge status: Home / Self Care | Payer: Self-pay | Attending: Cardiothoracic Surgery | Admitting: Cardiothoracic Surgery

## 2015-03-10 ENCOUNTER — Encounter
Admit: 2015-03-10 | Disposition: A | Discharge status: Home / Self Care | Payer: Self-pay | Attending: Cardiothoracic Surgery | Admitting: Cardiothoracic Surgery

## 2015-03-21 ENCOUNTER — Inpatient Hospital Stay
Admit: 2015-03-21 | Disposition: A | Discharge status: Home / Self Care | Payer: Self-pay | Attending: Internal Medicine | Admitting: Internal Medicine

## 2015-03-21 LAB — CBC WITH DIFFERENTIAL/PLATELET
BASOS ABS: 0.1 10*3/uL (ref 0.0–0.1)
Basophil %: 1.1 %
Eosinophil #: 1.4 10*3/uL — ABNORMAL HIGH (ref 0.0–0.7)
Eosinophil %: 25.1 %
HCT: 36.9 % — ABNORMAL LOW (ref 40.0–52.0)
HGB: 12 g/dL — ABNORMAL LOW (ref 13.0–18.0)
LYMPHS ABS: 1.2 10*3/uL (ref 1.0–3.6)
LYMPHS PCT: 21.6 %
MCH: 32.1 pg (ref 26.0–34.0)
MCHC: 32.6 g/dL (ref 32.0–36.0)
MCV: 99 fL (ref 80–100)
MONOS PCT: 10.7 %
Monocyte #: 0.6 x10 3/mm (ref 0.2–1.0)
NEUTROS ABS: 2.2 10*3/uL (ref 1.4–6.5)
Neutrophil %: 41.5 %
PLATELETS: 215 10*3/uL (ref 150–440)
RBC: 3.74 10*6/uL — ABNORMAL LOW (ref 4.40–5.90)
RDW: 14.5 % (ref 11.5–14.5)
WBC: 5.4 10*3/uL (ref 3.8–10.6)

## 2015-03-21 LAB — BASIC METABOLIC PANEL
ANION GAP: 6 — AB (ref 7–16)
BUN: 17 mg/dL
CREATININE: 1.09 mg/dL
Calcium, Total: 8.9 mg/dL
Chloride: 106 mmol/L
Co2: 30 mmol/L
EGFR (African American): >60
EGFR (Non-African Amer.): >60
Glucose: 114 mg/dL — ABNORMAL HIGH
Potassium: 4.1 mmol/L
Sodium: 142 mmol/L

## 2015-03-22 LAB — BASIC METABOLIC PANEL
Anion Gap: 4 — ABNORMAL LOW (ref 7–16)
BUN: 16 mg/dL
CHLORIDE: 104 mmol/L
CREATININE: 1.13 mg/dL
Calcium, Total: 8.4 mg/dL — ABNORMAL LOW
Co2: 32 mmol/L
GLUCOSE: 86 mg/dL
POTASSIUM: 4 mmol/L
SODIUM: 140 mmol/L

## 2015-03-22 LAB — CBC WITH DIFFERENTIAL/PLATELET
BASOS ABS: 0 10*3/uL (ref 0.0–0.1)
BASOS PCT: 0.7 %
Eosinophil #: 1.6 10*3/uL — ABNORMAL HIGH (ref 0.0–0.7)
Eosinophil %: 24.8 %
HCT: 35.5 % — ABNORMAL LOW (ref 40.0–52.0)
HGB: 11.3 g/dL — AB (ref 13.0–18.0)
LYMPHS ABS: 1.5 10*3/uL (ref 1.0–3.6)
Lymphocyte %: 23.4 %
MCH: 31.5 pg (ref 26.0–34.0)
MCHC: 31.9 g/dL — ABNORMAL LOW (ref 32.0–36.0)
MCV: 99 fL (ref 80–100)
MONO ABS: 0.6 x10 3/mm (ref 0.2–1.0)
Monocyte %: 10.2 %
Neutrophil #: 2.6 10*3/uL (ref 1.4–6.5)
Neutrophil %: 40.9 %
Platelet: 206 10*3/uL (ref 150–440)
RBC: 3.59 10*6/uL — ABNORMAL LOW (ref 4.40–5.90)
RDW: 14.5 % (ref 11.5–14.5)
WBC: 6.4 10*3/uL (ref 3.8–10.6)

## 2015-03-23 LAB — BASIC METABOLIC PANEL
Anion Gap: 7 (ref 7–16)
BUN: 19 mg/dL
CHLORIDE: 103 mmol/L
CREATININE: 1.09 mg/dL
Calcium, Total: 9 mg/dL
Co2: 30 mmol/L
EGFR (African American): >60
EGFR (Non-African Amer.): >60
GLUCOSE: 148 mg/dL — AB
Potassium: 4.8 mmol/L
SODIUM: 140 mmol/L

## 2015-03-23 LAB — CBC WITH DIFFERENTIAL/PLATELET
Basophil #: 0 10*3/uL (ref 0.0–0.1)
Basophil %: 0.1 %
Eosinophil #: 0 10*3/uL (ref 0.0–0.7)
Eosinophil %: 0 %
HCT: 36.9 % — AB (ref 40.0–52.0)
HGB: 12.1 g/dL — ABNORMAL LOW (ref 13.0–18.0)
LYMPHS ABS: 0.5 10*3/uL — AB (ref 1.0–3.6)
LYMPHS PCT: 13.1 %
MCH: 31.9 pg (ref 26.0–34.0)
MCHC: 32.7 g/dL (ref 32.0–36.0)
MCV: 98 fL (ref 80–100)
Monocyte #: 0 x10 3/mm — ABNORMAL LOW (ref 0.2–1.0)
Monocyte %: 1.1 %
NEUTROS ABS: 3.5 10*3/uL (ref 1.4–6.5)
Neutrophil %: 85.7 %
Platelet: 249 10*3/uL (ref 150–440)
RBC: 3.78 10*6/uL — ABNORMAL LOW (ref 4.40–5.90)
RDW: 14 % (ref 11.5–14.5)
WBC: 4.1 10*3/uL (ref 3.8–10.6)

## 2015-03-24 LAB — BASIC METABOLIC PANEL
ANION GAP: 4 — AB (ref 7–16)
BUN: 32 mg/dL — ABNORMAL HIGH
CHLORIDE: 105 mmol/L
CO2: 31 mmol/L
CREATININE: 1.12 mg/dL
Calcium, Total: 8.8 mg/dL — ABNORMAL LOW
EGFR (African American): >60
EGFR (Non-African Amer.): >60
GLUCOSE: 136 mg/dL — AB
Potassium: 4.8 mmol/L
SODIUM: 140 mmol/L

## 2015-03-24 LAB — CBC WITH DIFFERENTIAL/PLATELET
BASOS ABS: 0 10*3/uL (ref 0.0–0.1)
Basophil %: 0.3 %
EOS ABS: 0 10*3/uL (ref 0.0–0.7)
Eosinophil %: 0 %
HCT: 36.2 % — AB (ref 40.0–52.0)
HGB: 11.9 g/dL — AB (ref 13.0–18.0)
LYMPHS ABS: 0.7 10*3/uL — AB (ref 1.0–3.6)
Lymphocyte %: 7.6 %
MCH: 32.3 pg (ref 26.0–34.0)
MCHC: 33 g/dL (ref 32.0–36.0)
MCV: 98 fL (ref 80–100)
MONO ABS: 0.3 x10 3/mm (ref 0.2–1.0)
Monocyte %: 3 %
NEUTROS PCT: 89.1 %
Neutrophil #: 7.9 10*3/uL — ABNORMAL HIGH (ref 1.4–6.5)
PLATELETS: 234 10*3/uL (ref 150–440)
RBC: 3.69 10*6/uL — ABNORMAL LOW (ref 4.40–5.90)
RDW: 14.6 % — AB (ref 11.5–14.5)
WBC: 8.9 10*3/uL (ref 3.8–10.6)

## 2015-03-31 NOTE — H&P (Signed)
PATIENT NAME:  Kurt Hill, Kurt Hill MR#:  [id number] DATE OF BIRTH:  1932-04-19  DATE OF ADMISSION:  03/08/2013  PRIMARY CARE PHYSICIAN: Dr. Clayborn Bigness   CHIEF COMPLAINT: Weakness, dizziness and difficulty ambulating.   HISTORY OF PRESENT ILLNESS: This is a 81 year old male who presents to the hospital due to weakness and dizziness and difficulty ambulating. The patient says that he was driving, had stopped somewhere, attempted to get out of the car.  The moment he got up he felt weak and lightheaded and sat back down; 15 to 20 minutes later he attempted to try to get up again, although he was not able to bear any weight on his feet and sat back down on his car seat again. The patient was feeling increasingly weak and lightheaded and, therefore, called one of his friends and was brought over to the ER for further evaluation. At triage, the patient was noted to be hypotensive with systolic blood pressures in the 80s. He was also noted to have positive orthostatic vital signs. With some IV fluids in the ER, the patient's clinical symptoms have significantly improved. He was also noted to be in acute renal failure with some mild rhabdomyolysis. Hospitalist services were contacted for further treatment and evaluation. The patient has been having some persistent nausea for the past 3 to 4 days and also has had poor p.o. intake for the past 3 to 4 days.   REVIEW OF SYSTEMS:  CONSTITUTIONAL: No documented fever. No weight gain, no weight loss.  EYES: No blurry or double vision.  ENT: No tinnitus. No postnasal drip. No redness of the oropharynx.  RESPIRATORY: No cough, no wheeze. No hemoptysis, no dyspnea.  CARDIOVASCULAR: No chest pain, no orthopnea, no palpitations, no syncope.  GASTROINTESTINAL: Positive nausea. No vomiting. No diarrhea. No abdominal pain, no melena, no hematochezia.  GENITOURINARY: No dysuria or hematuria.  ENDOCRINE: No polyuria or nocturia. No heat or cold intolerance.  HEMATOLOGIC: No  anemia, no bruising, no bleeding.  INTEGUMENTARY: No rashes. No lesions.  MUSCULOSKELETAL: No arthritis. No swelling. No gout.  NEUROLOGIC: No numbness or tingling. No ataxia. No seizure-type activity.  PSYCHIATRIC: No anxiety, no insomnia, no ADD.  PAST MEDICAL HISTORY: Consistent with: 1. History of hypertension, currently not taking any meds.  2. History of acute renal failure.  3. History of cellulitis.  4. History of acute rhabdomyolysis.   ALLERGIES: IV DYE AND SULFA DRUGS.   SOCIAL HISTORY: No smoking. No alcohol abuse. No illicit drug abuse. He lives at home by himself.   FAMILY HISTORY: He cannot recall his family history as he was adopted.   CURRENT MEDICATIONS: Iron sulfate 325 mg t.i.d.   PHYSICAL EXAMINATION:  VITAL SIGNS: On admission, temperature is 97.4, pulse 71, respirations 18, blood pressure 124/84, saturations 97% on room air.  GENERAL: He is a pleasant-appearing male in no apparent distress.  HEENT: Atraumatic, normocephalic. Extraocular muscles are intact. Pupils are equal and reactive to light. Sclerae are anicteric. No conjunctival injection. No pharyngeal erythema.  NECK: Supple. There is no jugular venous distention, no bruits, no lymphadenopathy, no thyromegaly.  HEART: Regular rate and rhythm. No murmurs, no rubs, no clicks.  LUNGS: He has some coarse diffuse wheezing end expiratory, but otherwise no rales, no rhonchi. Negative use of accessory muscles. No dullness to percussion.  ABDOMEN: Soft, flat, nontender, nondistended. Has good bowel sounds. No hepatosplenomegaly appreciated.  EXTREMITIES: No evidence of any cyanosis or clubbing,  +1 to 2 pitting edema from the knees to the  ankles bilaterally,  also has chronic changes on his lower extremities consistent with chronic venostasis.  SKIN: Moist and warm with no rashes.  LYMPHATIC: There is no cervical or axillary lymphadenopathy.  NEUROLOGICAL: The patient is alert, awake and oriented x3 with no focal  motor or sensory deficits appreciated bilaterally.   LABORATORY, DIAGNOSTIC, AND RADIOLOGICAL DATA: Serum glucose of 90, BUN 26, creatinine 1.9, sodium 138, potassium 4.1, chloride 102, bicarbonate 30. LFTs showed AST of 126, albumin 3.9. Total CK of 2551, troponin less than 0.02. White cell count is 3.9, hemoglobin 11.4, hematocrit 33.6, platelet count of 152.    The patient had a CT of the head done without contrast which showed no acute intracranial process. The patient also had a chest x-ray done which showed no acute cardiopulmonary disease.   ASSESSMENT AND PLAN: This is a 81 year old male with a history of hypertension, history of acute renal failure, rhabdomyolysis and lower extremity cellulitis, presents to the hospital with dizziness, weakness, and noted to be hypotensive and also in acute renal failure.   1. Orthostatic hypotension: This is likely the cause of the patient's dizziness and weakness. This is likely secondary to dehydration from poor p.o. intake and persistent nausea that the patient has had for the past few days. I will aggressively hydrate the patient with IV fluids and repeat orthostatics in the morning.  2. Acute renal failure: This is likely related to the dehydration and hypotension. Likely this is an acute tubular necrosis from the hypotension. I will give the patient IV fluids, a repeat creatinine in the morning. The patient had a renal ultrasound done during last admission, therefore, I will not repeat one at this time.  3. Persistent nausea:  I suspect this is probably related to the iron supplements that the patient is taking which I will hold for now, treat his nausea with some p.r.n. Zofran. He has no abdominal pain, no diarrhea and no other GI symptoms.  4. Acute rhabdomyolysis: The exact etiology of this is unclear. The patient also on his previous admission about a few months back had a CK up to 1200, but it came back down with IV fluids. The patient has had no  recent fall, no trauma, no evidence of taking any statins that would cause rhabdomyolysis or elevated CKs. I will get a sed rate to rule out any evidence of myositis, possibly an autoimmune process.  If he continues to have persistently elevated CKs, he may need a rheumatology or neurology referral as an outpatient.   CODE STATUS: The patient is a FULL CODE.  TIME SPENT WITH ADMISSION: 50 minutes.  ____________________________ Belia Heman. Verdell Carmine, MD vjs:cb D: 03/08/2013 22:32:08 ET T: 03/08/2013 22:41:35 ET JOB#: [id number] cc: Belia Heman. Verdell Carmine, MD, <Dictator> Henreitta Leber MD ELECTRONICALLY SIGNED 03/10/2013 21:18

## 2015-03-31 NOTE — H&P (Signed)
PATIENT NAME:  Kurt Hill, Kurt Hill MR#:  [id number] DATE OF BIRTH:  09/27/1932  DATE OF ADMISSION:  02/209/2014   CHIEF COMPLAINT: Multiple complaints; however, presented initially for hypotension.   HISTORY OF PRESENT ILLNESS: The patient is a 81 year old male with history of asthma, hypertension, history of recent UTI, peripheral edema, generalized weakness with ataxia. Presented to ED today with multiple complaints. History was obtained from the patient who stated that his symptoms started in December of last year when he started having generalized weakness with difficulty ambulating and ataxia. At that time, the patient had a full neuro evaluation. He does not remember the name of his neurologist but states that his neurologist is in [facility]. His neuro evaluation was completely normal. He subsequently had an ENT evaluation which was also normal. Then, he was started on vitamin B12 injections by his neurologist, and his symptoms started improving. The patient states that in January of this year his ataxia and staggering with weakness started again. This time, the patient went to see his cardiologist, Dr. Clayborn Bigness. Per the patient, his evaluation was deemed normal; however, due to his continuing symptoms, they were planning a cardiac catheterization this Friday. The patient was noticed to have low blood pressure, and when he went home, his blood pressure was in the low 90s. The patient got concerned and presented to the ED for further evaluation. He received breathing treatment and fluids in the ED and at the time of my encounter was feeling better. He also appeared to have some cellulitis with peripheral edema in both of his lower extremities. The patient states that he has had several episodes of cellulitis in the past. He was recently also started on Levaquin 5 days ago for UTI. ED workup showed creatinine of 2.2 with a BUN of 27. Last creatinine in January of 2012 was 1.44.   REVIEW OF SYSTEMS:   CONSTITUTIONAL: The patient denies any fevers; however, has generalized fatigue and weakness.  HEENT: Denies any blurred vision or double vision, any tinnitus or any headaches.  RESPIRATORY: He does have mild wheezing which did improve with breathing treatments in ED; however, denies any cough or hemoptysis.  CARDIOVASCULAR SYSTEM: He denies any chest pain, any orthopnea or PND. He does have lower extremity edema with skin excoriations and cellulitis.   GASTROINTESTINAL: The patient denies any nausea, vomiting, abdominal pain or any diarrhea, hematochezia or melena.  NEUROLOGIC: Please see HPI.   PAST MEDICAL HISTORY:  1. History of cellulitis.  2. History of nephrolithiasis.  3. History of asthma.   SURGICAL HISTORY: Rhinoplasty, tonsillectomy, appendectomy.   MEDICATIONS PRIOR TO ADMISSION: Losartan 50 mg once daily, levofloxacin 500 mg daily, Lasix 40 mg daily, iron 325 mg daily, albuterol 2 puffs inhaled as needed.   SOCIAL HISTORY: The patient denies any smoking, alcohol or any drug use. He currently lives at home and is ambulating with a cane.   LABS AND DIAGNOSTIC DATA: Sodium 136, potassium 4.1, calcium 8.4, BUN 27, creatinine 2.2. Troponin less than 0.02. CK 1217. MB 5.3. BNP 508. EKG showed rate of 64, prolonged QT, QTc of 462 and nonspecific T wave changes.  IMPRESSION AND PLAN: The patient is a 81 year old male with a history of asthma, vitamin B12 deficiency, presenting with hypotension, rhabdomyolysis, acute renal failure, with some cellulitis on the lower extremities.  1. Cellulitis on the lower extremity: The patient has a recurrent history of cellulitis. Will place him on doxycycline and Ancef for now with pharmacy renal dosing.  2.  Acute renal failure, most likely caused by his hypotension and contributed to by losartan and Lasix. Hold off on Lasix and losartan. Will gently hydrate the patient. Obtain renal ultrasound for further workup. Obtain a urinalysis to rule out  urinary tract infection.  3. Rhabdomyolysis: The patient is not on any statins. He has no trauma. Unclear etiology.  continue to gently hydrate.  4. Ataxia: Unclear etiology for now. The patient's medical records from his neurologist in [facility] need to be obtained to avoid duplication of workup. Will obtain B12 and TSH level. The patient states that his symptoms did improve with B12 injections.  5. Asthma with mild exacerbation: Will continue albuterol breathing treatments scheduled and antibiotics.  6. Deep venous thrombosis prophylaxis: Heparin subcutaneous.   CODE STATUS: FULL CODE.     ____________________________ Estill Cotta, MD rr:gb D: 01/28/2013 02:01:33 ET T: 01/28/2013 02:22:36 ET JOB#: [id number]  cc: Estill Cotta, MD, <Dictator> Gino Garrabrant MD ELECTRONICALLY SIGNED 02/20/2013 20:09

## 2015-03-31 NOTE — Consult Note (Signed)
PATIENT NAME:  Kurt Hill, Kurt Hill MR#:  [id number] DATE OF BIRTH:  [birth year]  DATE OF CONSULTATION:  01/28/2013  REFERRING PHYSICIAN:  [facility] CONSULTING PHYSICIAN:  Robyne Matar D. Clayborn Bigness, MD PRIMARY CARE PHYSICIAN:  Lavera Guise, MD  INDICATION FOR CONSULTATION: Shortness of breath, angina, possible pulmonary hypertension, renal insufficiency with hypotension.   HISTORY OF PRESENT ILLNESS: The patient is a 81 year old white male with history of asthma, history of hypertension, recent UTI, peripheral edema, generalized weakness, ataxia and now hypotension. He had some vague chest pain. He has had persistent recurrent shortness of breath and dyspnea with exertion. He came to the Emergency Room with multiple complaints. Symptoms started back 2 months ago. He started having generalized weakness, difficulty ambulating with ataxia at the time. The patient had a full neuro evaluation, saw a neurologist at [facility]. Evaluation was basically unremarkable. He was treated with B12 injections, had some mild improvement. In January, the ataxia again got progressively worse. He was evaluated by cardiology. His workup again was reasonably unremarkable, but he was thought to have significant COPD. He was set up for a catheterization because of persistent shortness of breath, possible heart failure, and chest pain and possible angina. In the interim, he felt worse so came to the Emergency Room and was admitted for hypotension with blood pressures in the 90s. He was treated with fluids. Possible cellulitis and peripheral edema were also addressed and Levaquin for UTI. He also was found to have significant renal insufficiency. His creatinine was 2, BUN of 27, so he was admitted for further evaluation and care.   REVIEW OF SYSTEMS: No blackout spells or syncope. No nausea or vomiting. Denies fever, chills, sweats, weight loss, weight gain. No hemoptysis or hematemesis. No bright red blood per rectum. He has had weakness,  fatigue, hypotension, shortness of breath, dyspnea on exertion, ataxia, staggering.   PAST MEDICAL HISTORY: Cellulitis, nephrolithiasis, asthma, COPD, chest pain, angina, weakness.   PAST SURGICAL HISTORY: Rhinoplasty, tonsillectomy, appendectomy.   MEDICATIONS: Losartan 50 once a day, Levaquin 500 daily, Lasix 40 a day, iron 325 a day, albuterol 2 puffs as needed.   SOCIAL HISTORY: Quit smoking. No recent smoking. No alcohol consumption. He is a widow. Walks with a cane. Lives alone.   FAMILY HISTORY: Noncontributory.   LABORATORY AND DIAGNOSTIC DATA: Sodium 135, potassium 4.1, calcium 8.4, BUN 27, creatinine 2.2. Troponin was negative, CK 1217, MB 5.3. BNP 508. EKG: Sinus rhythm, nonspecific ST-T wave changes, rate of 65.   ASSESSMENT: Possible angina, congestive heart failure, possible pulmonary hypertension, chronic obstructive pulmonary disease, asthma, weakness, fatigue, hypotension, renal insufficiency, urinary tract infection.   PLAN: Agree with admit. Fluid resuscitation and evaluate for hypotension. Treat renal insufficiency. Consult nephrology. Will hopefully renal protect him and can proceed with cardiac catheterization to evaluate for possible congestive heart failure and pulmonary hypertension. We will refrain from blood pressure medications so that blood pressure can improve. Possible evaluation for his elevated CK. Treat cellulitis. Continue inhalers for asthma and COPD. Consider neurology evaluation for his ataxia and poor balance, but hopefully his blood pressure will improve and we can treat the patient medically. Will consider echocardiogram for hypotension, possible congestive heart failure, shortness of breath. Base further evaluation on the results of these studies.    ____________________________ Loran Senters Clayborn Bigness, MD ddc:jm D: 01/30/2013 10:23:15 ET T: 01/30/2013 16:51:57 ET JOB#: [id number]  cc: Eljay Lave D. Clayborn Bigness, MD, <Dictator> Yolonda Kida MD ELECTRONICALLY  SIGNED 02/16/2013 10:11

## 2015-03-31 NOTE — Discharge Summary (Signed)
PATIENT NAME:  MAVERIC, Kurt Hill MR#:  [id number] DATE OF BIRTH:  Sep 04, 1932  DATE OF ADMISSION:  03/08/2013 DATE OF DISCHARGE:  03/12/2013  ADMITTING DIAGNOSES:  1.  Weakness.  2.  Dizziness.  3.  Difficulty with ambulation.   DISCHARGE DIAGNOSES: 1.  Weakness and dizziness due to orthostatic hypotension.  2.  Orthostatic hypotension, possibly due to autonomic neuropathy. His cortisol level was normal. The patient was started on Florinef to help with this.  3.  Recurrent elevated CPK of unclear etiology. The patient has been seen by neurology in the past. He will need an EMG study in the near future.  4.  Acute renal failure due to dehydration and hypotension, now resolved.  5.  Nausea, possibly due to iron, now improved.  6.  History of B12 deficiency with normal B12 levels during his recent hospitalization.  7.  Chronic lower extremity erythema and a rash.  8.  History of hypertension but not on any medications now. Blood pressure at rest is elevated; however, standing, his blood pressure drops significantly.  9.  History of cellulitis.  10. History of B12 deficiency.   CONSULTANTS: None.   PERTINENT LABS AND EVALUATIONS: The patient's CPK on admission was 2551. His BMP: Glucose was 90, BUN 26, creatinine 1.93, sodium 138, potassium 4.1, chloride 102, CO2 was 30. His calcium was 8.1. His LFTs showed AST of 126. His WBC 3.9, hemoglobin 11.4, platelet count was 152. EKG: Normal sinus rhythm, prolonged QT. CT scan of the head showed no acute intracranial processes. Chest x-ray showed no acute cardiopulmonary processes. ESR was 35. His cortisol was 7.1. His ANA panel was negative, including for anti-Jo antibodies. His creatinine on April 2nd was 1.30. His CPK today is 1678, which is trending downwards.   HOSPITAL COURSE: Please refer to H and P done by the admitting physician. The patient is a 81 year old male, who presented to the hospital due to weakness, dizziness and difficulty ambulating.  The patient stated that he was driving and had to stop because he almost felt like he was going to pass out. The patient came to the ED and was noted to have a systolic blood pressure in the 80s with positive orthostatics. The patient also was noted to be in acute renal failure. The patient had a similar presentation 2 times previously. He also has had some difficulty with walking, especially going uphill. He was seen by neurology in 2013 and at that time had a B12 level checked and an MRI of his C-spine, which apparently were okay. His B12 level was low, so his B12 was replaced. The patient during this hospitalization was given IV fluids. Again, his sed rate was checked as previous hospitalization, and it was minimally elevated. He also had an ANA panel, which did not suggest any type of autoimmune process. With this difficulty with ambulation and elevated CPK, he does need EMG and nerve conduction studies. At this time, we will arrange him to be seen by neurology as an outpatient for further followup. In terms of his orthostatic hypotension, the patient was started on Florinef with improvement in his blood pressure. His blood pressure at rest is staying elevated though. I recommended he keep a log of his blood pressure to take to his primary MD. He reports that he will soon be seeing a new physician at [facility]. At this time, he is stable for discharge.   DISCHARGE MEDICATIONS:  1.  Iron sulfate 325 mg one tab p.o. t.i.d. 2.  ProAir 90 mcg two puffs 4 times per day as needed.  3.  Florinef 0.1 one tab p.o. daily.  4.  Tylenol 650 two tabs q. 4 p.r.n.  5.  Cyanocobalamin 100 mcg daily.   DIET: Low sodium.   ACTIVITY: As tolerated.   FOLLOWUP: With [facility] MD, has an appointment on 03/12/2013. Followup with neurology for EMG and neuro evaluation for muscle weakness. The patient may also need referral to rheumatology.   TIME SPENT: Thirty-five minutes.     ____________________________  Lafonda Mosses Posey Pronto, MD shp:lg D: 03/13/2013 08:38:32 ET T: 03/13/2013 14:36:49 ET JOB#: [id number]  cc: Laretha Luepke H. Posey Pronto, MD, <Dictator> Alric Seton MD ELECTRONICALLY SIGNED 03/14/2013 19:37

## 2015-03-31 NOTE — Discharge Summary (Signed)
PATIENT NAME:  GOLDEN, Kurt Hill DATE OF BIRTH:  1932/08/27  DATE OF ADMISSION:  01/28/2013 DATE OF DISCHARGE:  01/31/2013  PRIMARY CARE PHYSICIAN: Clayborn Bigness, MD  DISCHARGE DIAGNOSES:  1.  Leg cellulitis. 2.  Acute renal insufficiency. 3.  Ataxia. 4.  Rhabdomyolysis.  HISTORY OF PRESENT ILLNESS: This is a 81 year old male with history of asthma, hypertension, recent UTI, peripheral edema and generalized weakness with ataxia who presented to Emergency Room with multiple complaints. History was obtained from the patient who stated that his symptoms started in December of last year when he started having generalized weakness and difficulty with ambulation and ataxia. At that time, the patient had a full neuro evaluation and does not remember the name of his neurologist, but stated that his neurologist is in [facility].  His neuro evaluation was completely normal. Subsequently, he had ENT evaluation which was normal and then he was started on vitamin B12 injections by his neurologist. Symptoms started improving. The patient states that in January of this year his ataxia and staggering with weakness started again. This time the patient went to see his cardiologist, Dr. Clayborn Bigness. Per the patient, his evaluation was deemed normal. However, due to his continuing symptoms they were planning to do a cardiac catheterization. The patient noticed to have low blood pressure and when he went to home his blood pressure was in the 90s and he was concerned and he came to the Emergency Room for further evaluation. He received a breathing treatment and fluids in the Emergency Room and he was feeling better.  He also appeared to have cellulitis with peripheral edema of both his lower extremities. He has had several episodes of cellulitis in the past and had recently started on Levaquin 5 days ago for UTI. ED workup showed creatinine of 2.2 and BUN of 27. Last creatinine in January 2012 was 1.44.    HOSPITAL COURSE AND STAY: For cellulitis of his lower extremities, which was recurrent complaint, he was started on doxycycline and Ancef and he had slow but gradual improvement in his symptoms and he was discharged with that.   OTHER MEDICAL ISSUES ADDRESSED DURING THE HOSPITAL STAY:  1.  Acute renal failure. Most likely this was due to hypotension and contributed by losartan and Lasix as he was taking them. We held both of them and continued IV fluid and renal function improved. On presentation his creatinine was 2.21 which came to 1.3 at the time of discharge.  2.  Rhabdomyolysis. His CK level on presentation was 1217 which came down to 553 gradually. He was not on any statins. We gave him IV fluids and he had this decreased CK so will discharged him for further followup with his primary care.  3.  Ataxia, unclear etiology, and we advised him to follow up outpatient with neurologic clinic.  4. Asthma, possibly mild exacerbation. We continued albuterol, breathing treatments and scheduled antibiotics and he improved.  5.  For his shortness of breath and pulmonary hypertension, Dr. Clayborn Bigness was consulted as he was already seeing him in the clinic and plan was to do cardiac catheterization here in the hospital when his renal function improved and he was no longer hypotensive. Dr. Clayborn Bigness did the cardiac cath and the results summary is: Ejection fraction 55%, mild elevated pulmonary pressure, no significant coronary artery disease, bilateral renal shots for chronic renal insufficiency and hypotension, and no significant atherosclerotic coronary vascular disease. Plan: Medical therapy for noncardiac chest pain.   LAB  RESULTS: As discussed above; Cardiac cath results and creatinine.  CONSULTS DURING HOSPITAL STAY: Lujean Amel, MD  CONDITION ON DISCHARGE: Stable.   CODE STATUS: FULL CODE.   DISCHARGE MEDICATIONS:  1.  Doxycycline 100 mg oral every 12 hours for 4 days. 2.  Cefuroxime 500 mg  oral tablet 2 times a day for 4 days. 3.  Ferrous sulfate 325 mg oral tablet 3 times a day. 4.  Aspirin 81 mg once a day.   HOME HEALTH ON DISCHARGE: Yes; home health services and nurse aide.   HOME OXYGEN ON DISCHARGE: No.   DIET ON DISCHARGE: Low sodium.   DIET CONSISTENCY: Regular.   ACTIVITY LIMITATION: As tolerated.   TIMEFRAME TO FOLLOWUP:  Within 1 to 2 weeks. Advised to follow with primary care physician to check the kidney function within 1 to 2 weeks and need to follow up with neurologist for ataxia.   TOTAL TIME SPENT ON THIS DISCHARGE: 45 minutes.  ____________________________ Ceasar Lund Anselm Jungling, MD vgv:sb D: 02/01/2013 18:47:08 ET T: 02/02/2013 08:56:05 ET JOB#: [id number]  cc: Ceasar Lund. Anselm Jungling, MD, <Dictator> Lavera Guise, MD Rosalio Macadamia [facility] MD ELECTRONICALLY SIGNED 02/07/2013 23:05

## 2015-04-01 NOTE — Discharge Summary (Signed)
PATIENT NAME:  Kurt Hill, Kurt Hill MR#:  [id number] DATE OF BIRTH:  [birth year]  DATE OF ADMISSION:  03/20/2014  DATE OF DISCHARGE:  03/22/2014  DIAGNOSES AT TIME OF DISCHARGE:  1.  Acute shortness of breath secondary to asthma exacerbation and bronchitis.  2.  Hypothyroidism.  3.  History of hypertension.   CHIEF COMPLAINT: Shortness of breath.   HISTORY OF PRESENT ILLNESS: Kurt Hill is a 81 year old male with a history of hypertension, asthma, who presented to the Emergency Room complaining of acute onset shortness of breath and wheezing. Patient states that he was digging out a stump in his yard and then initially started off with symptoms that appear to be like allergies associated with cough and sinus congestion and subsequently clear an asthma attack. He was noted to be hypoxemic in the ED with oxygen saturation of 80%, but denied any chest pain. No ankle edema. No pain in the calves.   PAST MEDICAL HISTORY:  Significant for hypertension, asthma, anemia, hypothyroidism.   SOCIAL HISTORY: He lives alone. He is widowed. Never smoked. Denied any use of alcohol.   PHYSICAL EXAMINATION:  VITAL SIGNS: Temperature 97.6, pulse was 104, blood pressure 163/75, repeat blood pressure 129/59, oxygen saturation 84% on room air, respirations 28, oxygen saturation improved to 95% later on room air.  GENERAL: He was not in distress.  HEENT:  Normocephalic, atraumatic. Oropharynx was clear.   LUNGS: Bilateral diffuse inspiratory and expiratory wheezes.  ABDOMEN: Soft, nontender.  EXTREMITIES: 1+ edema.  NEUROLOGIC: Nonfocal.   LABORATORY DATA: BUN is 28, creatinine 1.24, sodium 140, potassium 3.7 and troponin less than 0.02. TSH 18.6. WBC 10.6, hemoglobin 11.7, hematocrit 36.6, platelets 196,000.   Chest x-ray showed mild lung hyperexpansion without acute cardiopulmonary disease. There is interval development of apparent cluster nodular opacities overlying the peripheral aspect of the left lower lung  field and a CT scan was recommended. A CT of the chest noncontrast showed patchy areas of bilateral multilobar areas with nodular  air space opacity most typical of small airways infection likely corresponding to the finding of the recent exam and central bronchial wall thickening maybe associated with bronchitis with reactive airway disease and viral or atypical infection.   HOSPITAL COURSE:  During his stay in hospital, the patient was treated with intravenous Solu-Medrol and his symptoms gradually improved.   DISCHARGE MEDICATIONS:  He was discharged in stable condition on the following medications:  Levothyroxine 88 mcg 1 tablet a day, ferrous sulfate 325 mg b.i.d., HFA 2 puffs q.i.d. p.r.n., albuterol nebulizers q. 4-6h. p.r.n., Vitamin D3 one capsule once a week,  prednisone 10 mg tablets 40 mg a day for 3 days, decrease by 10 mg every 3 days until gone,  Tussionex syrup 1 tsp b.i.d. p.r.n.,  Ceftin 250 mg p.o. b.i.d. for 7 days.   FOLLOWUP: The patient has been advised to follow up in the clinic with me, Dr. Ginette Pitman, in 1-2 weeks time. Advised to call back with any questions or concerns. He was stable at the time of discharge.   TIME SPENT:  Total time spent on discharge of this patient 35 minutes.    ____________________________ Tracie Harrier, MD vh:tc D: 03/22/2014 12:50:13 ET T: 03/22/2014 13:11:47 ET JOB#: [id number]  cc: Tracie Harrier, MD, <Dictator> Tracie Harrier MD ELECTRONICALLY SIGNED 03/28/2014 18:42

## 2015-04-01 NOTE — H&P (Signed)
PATIENT NAME:  Kurt Hill, Kurt Hill MR#:  [id number] DATE OF BIRTH:  11-08-1932  DATE OF ADMISSION:  03/20/2014  REFERRING PHYSICIAN: From ED, Dr, Corky Downs  CHIEF COMPLAINT: Shortness of breath.   HISTORY OF PRESENT ILLNESS: This is a pleasant 81 year old gentleman who has a history of hypertension as well as asthma who was admitted with relatively acute onset of shortness of breath and wheezing yesterday. He says yesterday he was doing relatively well, was out digging out a stump in his yard. He then developed what he thought were allergies with some cough and sinus congestion. This seemed to have triggered his asthma attack and he came to the ER and was found to be hypoxic in the 80s. He has had no chest pain. He has had no swelling in his legs that he notes of or pain in his calves. He has had no recent extensive travel or periods of immobility.   The patient does report he has recently had a CT scan of his abdomen for some weight loss and abdominal distention. Also on review of note is he has had two Dopplers of his lower extremity to evaluate for shortness of breath and edema. Most recent was 03/15 and prior to that was 02/10, both of which were negative.   PAST MEDICAL HISTORY: 1. Hypertension.  2. Asthma. He has never been intubated for this. He  had it for many years, although he was never a smoker.  3. Anemia on iron.  4. Hypothyroidism on Synthroid.   PAST SURGICAL HISTORY: None.   SOCIAL HISTORY: The patient currently lives alone. He is widowed after being married for 50 years. Never smoked. Does not drink. He really has not much in terms of family who look in on him.   FAMILY HISTORY: He was adopted.   CURRENT MEDICATIONS: Synthroid and iron tablets.   ALLERGIES: The patient is listed as being allergic to contrast dye PHYSICAL EXAMINATION: VITAL SIGNS: Temperature 97.6, pulse initially 104, blood pressure 163/75, sats are 84% on room air. Respiratory rate was 28 currently, pulse 98,  blood pressure 129/59, respirations 23, sat 95% on room air.  GENERAL: He is pleasant, interactive, sitting in bed in no acute distress.  HEENT: Pupils equal, round, and reactive to light and accommodation. Extraocular movements are intact. Sclerae anicteric.  OROPHARYNX: Clear.  NECK: Supple. No anterior cervical, posterior cervical or supraclavicular lymphadenopathy.  HEART: Regular.  LUNGS: He has diffuse wheezing bilaterally.  ABDOMEN: Soft, nontender, mildly distended. He does have some firmness in the right upper quadrant.  EXTREMITIES: He has 1 to 2+ bilateral lower extremity edema.  NEUROLOGIC: He is alert and oriented x 3. Grossly nonfocal neuro exam.   DIAGNOSTIC DATA: Chest x-ray 04/12 shows mild lung hyperexpansion as well as  interval development of small  clustered nodular opacities overlying the peripheral aspect of the left lower lung, not definitively seen on prior examinations. CT of the abdomen done 03/20 showed dilation of the right colon with increased stool which could account for symptoms of abdominal distention. Ultrasound bilateral lower extremities 03/15 and 02/10 were negative. Chest x-ray done 02/19/2014 showed no acute abnormalities but mild changes of chronic obstructive pulmonary disease and chronic bronchitis.   IMPRESSION: A 81 year old history of asthma as well as hypertension. He has been undergoing work-up for some shortness of breath as well as abdominal distention and some mild weight loss. He is admitted now with acute onset of shortness or breath and wheezing. This is consistent with his asthma history,  although there has been issues over the last several months as he has been worked up for with concern for malignancy.  1. Asthma exacerbation. We will place the patient on Solu-Medrol, as well as nebulizers. We will hold on antibiotics if there is no evidence of infection at this time. We will do a flu swab given the rather sudden onset.  2. Weight loss and some  concern for possible malignancy. The patient has already had a CT of the abdomen and pelvis. Somewhat concerned about a possible malignancy. Given his chest x-ray findings I am going to put him in for CT of his chest with PE protocol also to evaluate the opacities noted on the chest x-ray.  2. We will check TSH.  3. Continue Synthroid.  4. Continue iron tablets as well.    ____________________________ Cheral Marker. Ola Spurr, MD dpf:sg D: 03/20/2014 11:24:26 ET T: 03/20/2014 11:59:03 ET JOB#: [id number]  cc: Cheral Marker. Ola Spurr, MD, <Dictator> Ramon Brant Ola Spurr MD ELECTRONICALLY SIGNED 03/22/2014 22:18

## 2015-04-09 NOTE — H&P (Addendum)
PATIENT NAME:  Kurt Hill, Kurt Hill MR#:  [id number] DATE OF BIRTH:  03-04-1932  PATIENT'S PRIMARY DOCTOR:  Tracie Harrier, M.D.   CHIEF COMPLAINT:  The patient is a direct admit from wound care center.    REASON FOR ADMISSION:  Left leg cellulitis.   HISTORY OF PRESENT ILLNESS:  An 81 year old male patient has been followed up by wound care center for left leg chronic wound and has been managed by them for the last few months. I got a call today because of his leg cellulitis getting worse and despite antibiotics.  The patient went to see Dr. Ginette Pitman on last Thursday and he was prescribed Keflex 500 mg q.i.d.  The patient took this for 5 days and he went for wound care followup today where he was found to have significant redness, pain and swelling of the left leg.  Concerning this, we are asked to admit the patient. The patient has no fever, no shortness of breath.  Has a very painful and tender left leg and erythema of the left leg from the knee down to the ankle area. The patient denies any recent history of travel and lives by himself and not really sedentary at home.  The patient has been having a lot of itching around that area and has been scratching.    PAST MEDICAL HISTORY: Significant for asthma mild intermittent and history of hypothyroidism.   ALLERGIES: HE IS ALLERGIC TO SULFA AND IV DYES.   PAST SURGICAL HISTORY: Significant for appendectomy and tonsillectomy.   SOCIAL HISTORY: No smoking, no drinking, no drugs. Lives alone.   FAMILY HISTORY: Significant for hypertension in 2 of his brothers.    MEDICATIONS:  Albuterol inhalation ProAir every 4 hours as needed for wheezing, tramadol 50 mg every 8 hours for pain as needed.  He is also on levothyroxine 88 mcg p.o. daily, ferrous sulfate 325 mg p.o. daily, vitamin B3 once a week, and he is also on Benadryl for itching as needed.  REVIEW OF SYSTEMS:   GENERAL:  The patient has no fever, no weight gain, no weight loss.  EYES: No blurred  vision.  EARS, NOSE AND THROAT: No tinnitus, no epistaxis, no difficulty swallowing.  RESPIRATORY: No cough. No wheezing.  CARDIOVASCULAR: No chest pain, orthopnea, no PND.  GASTROINTESTINAL: Has no nausea, no vomiting, no abdominal pain.  GENITOURINARY: No dysuria.  ENDOCRINE: No polydipsia or nocturia. Has history of thyroid problems.  HEMATOLOGIC: No anemia.  MUSCULOSKELETAL: Has left leg swelling, redness, and tenderness. PSYCHIATRIC: No anxiety or insomnia.   PHYSICAL EXAMINATION:  VITAL SIGNS: Temperature 97.4 Fahrenheit, heart rate 66, blood pressure 140/80, saturation 98% on room air.  HEAD: Atraumatic, normocephalic.  EYES: Pupils equal and reacting to light. Extraocular movements are intact.  EARS, NOSE, AND THROAT: No tympanic membrane congestion, no turbinate hypertrophy, no oropharyngeal erythema.  NECK: Supple. No JVD, no carotid bruit. Normal range of motion.  No lymphadenopathy.  RESPIRATORY: Bilaterally clear to auscultation. No wheeze. No rales. The patient is not using accessory muscles of respiration.  CARDIOVASCULAR: Regular rate and rhythm. No murmurs. The patient has good femoral pulses bilaterally. He does have lower extremity edema 2+ up to knees.  ABDOMEN: Soft, nontender, nondistended. Bowel sounds present. No hepatosplenomegaly, no hernias.  MUSCULOSKELETAL: The patient's strength is 5/5 in upper extremities and does not have any cyanosis or kyphosis.  SKIN: Inspection has erythema, induration, and tenderness to palpation on the left leg and the patient has swelling and erythema extending from left leg  below the knee almost to the dorsal aspect of the left ankle and tenderness to palpation.  Does have areas of dry skin.  Right leg is also edematous, but nontender to palpation. NEUROLOGIC: The patient is alert, awake, oriented. Cranial nerves II through XII.  DTRs 2+ bilaterally. No dysarthria or aphasia.  PSYCHIATRIC: Motor and affect are within normal limits.    LABORATORY DATA:  CBC, and nd ultrasound of the left leg are ordered and only CBC is available at this time.  White count 5.4, hemoglobin 12, hematocrit 36.9, platelets 215,000.    ASSESSMENT AND PLAN:   1.  The patient is an 81 year old male patient with left leg cellulitis, failed outpatient therapy with Keflex, admitted to hospitalist service, started on IV Ancef, obtain left leg ultrasound to evaluate for deep vein thrombosis and evaluate the left leg.  Continue wound care and see how he does.  2.  Hypothyroidism. Continue Synthroid that he takes.  3.  History of asthma.  Use p.r.n. albuterol inhaler.   4.  Deep vein thrombosis prophylaxis with Lovenox 40 subcutaneous daily and if ultrasound is positive for deep vein thrombosis he will be on full dose Xarelto.   TIME SPENT: 55 minutes and will sign off to Dr. Ginette Pitman.   ____________________________ Epifanio Lesches, MD sk:sp D: 03/21/2015 14:57:58 ET T: 03/21/2015 15:25:59 ET JOB#: [id number]  cc: Epifanio Lesches, MD, <Dictator> Tracie Harrier, MD Epifanio Lesches MD ELECTRONICALLY SIGNED 04/11/2015 21:10

## 2015-04-09 NOTE — Discharge Summary (Signed)
PATIENT NAME:  Kurt Hill, Kurt Hill MR#:  [id number] DATE OF BIRTH:  1932-01-30  DATE OF ADMISSION:  03/21/2015 DATE OF DISCHARGE:  03/24/2015  DISCHARGE DIAGNOSES:  1.  Left lower extremity cellulitis after failing outpatient antibiotic therapy.  2.  Hypothyroidism.  3.  History of chronic obstructive pulmonary disease. 4.  History of anemia.  CHIEF COMPLAINT: Poorly healing left lower extremity ulcer.   HISTORY OF PRESENT ILLNESS: Kurt Hill is an 81 year old gentleman who was being followed in the wound care center and subsequently sent to the ED because of poorly healing left leg ulcer with surrounding cellulitis. The patient had been taking Keflex without much improvement in symptoms and also had been complaining of significant itching around the ulcerated area with evidence of redness and dry skin.   PAST MEDICAL HISTORY: Significant for COPD and hypothyroidism. Please see H and P for full details.   HOSPITAL COURSE: The patient was admitted to [facility] and started on IV antibiotics with Ancef and also p.o. clindamycin. He was also seen in the wound care center and an Unna boot was applied. Labs on admission showed a hemoglobin of 12 grams, WBC count of 5.4, platelet count of 215,000, glucose 114, BUN 17, creatinine 1.09, sodium 142, potassium 4.1, chloride 106, and CO2 30. The patient had episodes of shortness breath and wheezing for which he was started on IV Solu-Medrol and also nebulized bronchodilator therapy. The patient was also seen by physical therapy and it was recommended that he continue antibiotics as an outpatient with follow-up in the wound care center.   DISCHARGE MEDICATIONS: Cleocin 300 mg p.o. t.i.d. for 10 days, prednisone 40 mg a day for 3 days and decrease by 10 mg every 3 days until gone, calamine zinc oxide topical dressing 2 applications topically, Keflex 500 mg p.o. t.i.d. for 10 more days, Doxepin 10 mg once a day at bedtime, tamsulosin 0.4 mg once a day, vitamin D2 50,000  units once a week, levothyroxine 100 mcg once a day, ProAir HFA 2 puffs q.i.d. p.r.n., and ferrous sulfate 325 mg p.o. b.i.d.   DISCHARGE INSTRUCTIONS: The patient was advised to follow up with me, Dr. Tracie Harrier, as scheduled and also follow up with the wound care center soon. He has been advised to call the office with any questions or concerns.   TOTAL TIME SPENT ON DISCHARGING THIS PATIENT: 35 minutes.   ____________________________ Tracie Harrier, MD vh:sb D: 03/24/2015 12:58:31 ET T: 03/24/2015 14:03:41 ET JOB#: [id number]  cc: Tracie Harrier, MD, <Dictator> Tracie Harrier MD ELECTRONICALLY SIGNED 04/05/2015 13:28

## 2015-04-11 ENCOUNTER — Encounter: Payer: Medicare Other | Attending: Surgery | Admitting: Surgery

## 2015-04-11 DIAGNOSIS — L97921 Non-pressure chronic ulcer of unspecified part of left lower leg limited to breakdown of skin: Secondary | ICD-10-CM | POA: Insufficient documentation

## 2015-04-11 DIAGNOSIS — S80922A Unspecified superficial injury of left lower leg, initial encounter: Secondary | ICD-10-CM | POA: Insufficient documentation

## 2015-04-11 DIAGNOSIS — X58XXXA Exposure to other specified factors, initial encounter: Secondary | ICD-10-CM | POA: Insufficient documentation

## 2015-04-11 DIAGNOSIS — L03116 Cellulitis of left lower limb: Secondary | ICD-10-CM | POA: Insufficient documentation

## 2015-04-11 NOTE — Progress Notes (Signed)
JARRED, PURTEE ([id number]) Visit Report for 04/11/2015 Chief Complaint Document Details Patient Name: Kurt Hill, Kurt Hill. Date of Service: 04/11/2015 11:00 AM Medical Record Number: [id number] Patient Account Number: [id number] Date of Birth/Sex: [birth year] (81 y.o. Male) Treating RN: Primary Care Physician: Tracie Harrier Other Clinician: Referring Physician: Marjean Donna Treating Physician/Extender: Frann Rider in Treatment: 20 Information Obtained from: Patient Chief Complaint Patient send today for follow-up of non-healing ulceration. he was recently admitted to the hospital with a significant cellulitis of his left lower extremity and details are noted. Electronic Signature(s) Signed: 04/11/2015 1:44:13 PM By: Christin Fudge MD, FACS Entered By: Christin Fudge on 04/11/2015 11:44:25 Dolores Frame ([id number]) -------------------------------------------------------------------------------- HPI Details Patient Name: Kurt Rud D. Date of Service: 04/11/2015 11:00 AM Medical Record Number: [id number] Patient Account Number: [id number] Date of Birth/Sex: 17-Feb-1932 (80 y.o. Male) Treating RN: Primary Care Physician: Tracie Harrier Other Clinician: Referring Physician: Marjean Donna Treating Physician/Extender: Frann Rider in Treatment: 20 History of Present Illness Location: right leg Quality: Patient reports experiencing a dull pain to affected area(s). Severity: traumatic wound Duration: Patient states the wound has been present for 6 weeks Timing: Pain in wound is Intermittent (comes and goes Modifying Factors: hematoma with skin slough HPI Description: he is feeling much better today and has no issues with his itching has he has been on prednisone. His wound is coming along nicely and he has no fresh issues. 01/31/15 -- his PCP has put him on some steroid orally and this has helped his itching. Wanted [place] to recommend a allergist to him. 02/07/15 -- overall  he is doing well and his itching has Markedly come down. He is still on his prednisone. 02/14/2015 -- he says he is now off his prednisone but he still continues to have a lot of itching of his skin. He is yet to see his dermatologist. On his left lower extremity he still has some areas where he scratched a lot and cause some scratch marks and scabs. 03/14/2015 -- on his left second toe he has developed a blister today probably due to tight shoes but he says he does not want [place] to take care of it and he would look after himself. Other than that he is doing fine. 03/21/2015 -- for the last 5 days the patient has had a redness and swelling of his left leg which was rather severe and he went to his PCP who put him on Keflex. Patent continues to have swelling redness and tenderness of his left lower extremity and this is circumferential and starts at his toes and goes up to his knee. the patient also saw his dermatologist for an unrelated problem and she has put him on some steroid applications for the lower extremity. The patient has not been feeling well has had a tenderness and is extremely concerned about the redness on his lower extremity. He is not running any high-grade fever and continues to ambulate with help. 03/28/2015. I have reviewed details of his discharge summary from his recent hospitalization. He was admitted to the hospital for a cellulitis of the left lower extremity and was there between 4/12 and 03/24/2015. He was treated with IV Ancef and by mouth clindamycin and his cellulitis got better,also with some steroids where he received IV Solu-Medrol. His discharge medications included Cleocin 300 mg by mouth 3 times a day for 10 days prednisone 40 mg a day for 3 days and then to be decreased by 10 mg every 3 days until gone.  He also was supposed to continue with Keflex 500 mg by mouth 3 times a day for 10 more days. overall he is feeling much better and his lungs are also doing good.  He has no fresh issues. 04/04/2015 -- between last week and today the left lower extremity has become red again and he has a lot of itching and bumps all over his leg. The leg is also mildly swollen and he has some element of cellulitis Kurt Hill, Kurt D. ([id number]) which has reappeared. The original wound which we were treating has almost completely healed. Kurt Hill is of the opinion that the silver in the dressing material is causing some of his problems and he would like as not to use that any longer. He continues to be on his oral antibiotics which is Keflex and clindamycin. He has stopped his steroids. 04/11/2015 -- he has seen his PCP and then taken appropriate course of antibiotics and his cellulitis is much better. He has been dressing his wound appropriately and seems to be doing fine from this. Electronic Signature(s) Signed: 04/11/2015 1:44:13 PM By: Christin Fudge MD, FACS Entered By: Christin Fudge on 04/11/2015 11:45:02 Dolores Frame ([id number]) -------------------------------------------------------------------------------- Physical Exam Details Patient Name: Kurt Rud D. Date of Service: 04/11/2015 11:00 AM Medical Record Number: [id number] Patient Account Number: [id number] Date of Birth/Sex: February 18, 1932 (81 y.o. Male) Treating RN: Primary Care Physician: Tracie Harrier Other Clinician: Referring Physician: Marjean Donna Treating Physician/Extender: Frann Rider in Treatment: 20 Constitutional . Pulse regular. Respirations normal and unlabored. Afebrile. . Eyes Nonicteric. Reactive to light. Ears, Nose, Mouth, and Throat Lips, teeth, and gums WNL.Marland Kitchen Moist mucosa without lesions . Neck supple and nontender. No palpable supraclavicular or cervical adenopathy. Normal sized without goiter. Respiratory WNL. No retractions.. Cardiovascular Pedal Pulses WNL. mild +1 pitting edema and minimal cellulitis both lower extremities.. Integumentary (Hair, Skin) No suspicious  lesions. the wound on his left lower extremity has completely healed.. No crepitus or fluctuance. No peri-wound warmth or erythema. No masses.Marland Kitchen Psychiatric Judgement and insight Intact.. No evidence of depression, anxiety, or agitation.. Electronic Signature(s) Signed: 04/11/2015 1:44:13 PM By: Christin Fudge MD, FACS Entered By: Christin Fudge on 04/11/2015 11:45:50 Dolores Frame ([id number]) -------------------------------------------------------------------------------- Physician Orders Details Patient Name: Kurt Rud D. Date of Service: 04/11/2015 11:00 AM Medical Record Number: [id number] Patient Account Number: [id number] Date of Birth/Sex: 10-Jun-1932 (81 y.o. Male) Treating RN: Junious Dresser Primary Care Physician: Tracie Harrier Other Clinician: Referring Physician: Marjean Donna Treating Physician/Extender: Frann Rider in Treatment: 20 Verbal / Phone Orders: Yes Clinician: Junious Dresser Read Back and Verified: Yes Diagnosis Coding Skin Barriers/Peri-Wound Care o Moisturizing lotion Discharge From [place] o Discharge from [place] Signature(s) Signed: 04/11/2015 1:44:13 PM By: Christin Fudge MD, FACS Signed: 04/11/2015 4:57:42 PM By: Junious Dresser RN Entered By: Junious Dresser on 04/11/2015 11:29:07 Dolores Frame ([id number]) -------------------------------------------------------------------------------- Problem List Details Patient Name: Kurt Rud D. Date of Service: 04/11/2015 11:00 AM Medical Record Number: [id number] Patient Account Number: [id number] Date of Birth/Sex: 07/29/1932 (81 y.o. 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 Date of Service: 04/11/2015 11:00 AM Medical Record Number: [id number] Patient Account Number: [id number] Date of Birth/Sex: 1931-12-28 (80 y.o. Male) Treating RN: Primary Care Physician: Tracie Harrier Other Clinician: Referring Physician: Marjean Donna Treating Physician/Extender: Frann Rider in Treatment: 20 Subjective Chief Complaint Information obtained from Patient Patient send today for follow-up of non-healing ulceration. he was recently admitted to the hospital with a significant cellulitis of his left lower extremity and details are noted. History of Present Illness (HPI) The following HPI elements were documented for the patient's wound: Location: right leg Quality: Patient reports experiencing a dull pain to affected area(s). Severity: traumatic wound Duration: Patient states the wound has been present for 6 weeks Timing: Pain in wound is Intermittent (comes and goes Modifying Factors: hematoma with skin slough he is feeling much better today and has no issues with his itching has he has been on prednisone. His wound is coming along nicely and he has no fresh issues. 01/31/15 -- his PCP has put him on some steroid orally and this has helped his itching. Wanted [place] to recommend a allergist to him. 02/07/15 -- overall he is doing well and his itching has Markedly come down. He is still on his prednisone. 02/14/2015 -- he says he is now off his prednisone but he  still continues to have a lot of itching of his skin. He is yet to see his dermatologist. On his left lower extremity he still has some areas where he scratched a lot and cause some scratch marks and scabs. 03/14/2015 -- on his left second toe he has developed a blister today probably due to tight shoes but he says he does not want [place] to take care of it and he would look after himself. Other than that he is doing fine. 03/21/2015 -- for the last 5 days the patient has had a redness and swelling of his left leg which was rather severe and he went to his PCP who put him on Keflex. Patent continues to have swelling redness and tenderness of his left lower extremity and this is circumferential and starts at his toes and goes up to his knee. the patient also saw his dermatologist for an unrelated problem and she has put him on some steroid applications for the lower extremity. The patient has not been feeling well has had a tenderness and is extremely concerned about the redness on his lower extremity. He is not running any high-grade fever and continues to ambulate with help. 03/28/2015. I have reviewed details of his discharge summary from his recent hospitalization. He was admitted to the hospital for a cellulitis of the left lower extremity and was there between 4/12 and 03/24/2015. He was treated with IV Ancef and by mouth clindamycin and his cellulitis got better,also with Kurt Hill, Kurt D. ([id number]) some steroids where he received IV Solu-Medrol. His discharge medications included Cleocin 300 mg by mouth 3 times a day for 10 days prednisone 40 mg a day for 3 days and then to be decreased by 10 mg every 3 days until gone. He also was supposed to continue with Keflex 500 mg by mouth 3 times a day for 10 more days. overall he is feeling much better and his lungs are also doing good. He has no fresh issues. 04/04/2015 -- between last week and today the left lower extremity has  become red again and  he has a lot of itching and bumps all over his leg. The leg is also mildly swollen and he has some element of cellulitis which has reappeared. The original wound which we were treating has almost completely healed. Kurt Hill is of the opinion that the silver in the dressing material is causing some of his problems and he would like as not to use that any longer. He continues to be on his oral antibiotics which is Keflex and clindamycin. He has stopped his steroids. 04/11/2015 -- he has seen his PCP and then taken appropriate course of antibiotics and his cellulitis is much better. He has been dressing his wound appropriately and seems to be doing fine from this. Objective Constitutional Pulse regular. Respirations normal and unlabored. Afebrile. Vitals Time Taken: 11:15 AM, Height: 70 in, Weight: 176 lbs, BMI: 25.3, Temperature: 97.5 F, Pulse: 67 bpm, Respiratory Rate: 16 breaths/min, Blood Pressure: 144/66 mmHg. Eyes Nonicteric. Reactive to light. Ears, Nose, Mouth, and Throat Lips, teeth, and gums WNL.Marland Kitchen Moist mucosa without lesions . Neck supple and nontender. No palpable supraclavicular or cervical adenopathy. Normal sized without goiter. Respiratory WNL. No retractions.. Cardiovascular Pedal Pulses WNL. mild +1 pitting edema and minimal cellulitis both lower extremities.Marland Kitchen Psychiatric Judgement and insight Intact.. No evidence of depression, anxiety, or agitation.Dolores Frame ([id number]) Integumentary (Hair, Skin) No suspicious lesions. the wound on his left lower extremity has completely healed.. No crepitus or fluctuance. No peri-wound warmth or erythema. No masses.. Wound #1 status is Healed - Epithelialized. Original cause of wound was Trauma. The wound is located on the Left,Anterior Lower Leg. The wound measures 0cm length x 0cm width x 0cm depth; 0cm^2 area and 0cm^3 volume. The wound is limited to skin breakdown. There is no tunneling or undermining noted. There is a  small amount of serous drainage noted. The wound margin is flat and intact. There is large (67-100%) red granulation within the wound bed. There is a small (1-33%) amount of necrotic tissue within the wound bed including Adherent Slough. The periwound skin appearance exhibited: Dry/Scaly, Erythema. The periwound skin appearance did not exhibit: Callus, Crepitus, Excoriation, Fluctuance, Friable, Induration, Localized Edema, Rash, Scarring, Maceration, Moist, Atrophie Blanche, Cyanosis, Ecchymosis, Hemosiderin Staining, Mottled, Pallor, Rubor. The surrounding wound skin color is noted with erythema. Periwound temperature was noted as No Abnormality. The periwound has tenderness on palpation. 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 As far as his cellulitis goes it looks much better than last week and he still on his oral antibiotics. I have urged him to continue seeing his PCP regarding this. we have discussed adequate moisturization of his lower extremity and to make sure he does not scratch his legs and try to wear socks on his feet and mittens on his hands when he goes to bed at night. Plan Kurt Hill, Kurt Hill ([id number]) Skin Barriers/Peri-Wound Care: Moisturizing lotion Discharge From [facility] Services: Discharge from [facility] He is discharged from the wound care services and will be seen back as needed. Electronic Signature(s) Signed: 04/11/2015  12:17:14 PM By: Junious Dresser  RN Signed: 04/11/2015 1:44:13 PM By: Christin Fudge MD, FACS Entered By: Junious Dresser on 04/11/2015 12:17:13

## 2015-04-11 NOTE — Progress Notes (Signed)
GARETT, Kurt Hill ([id number]) Visit Report for 04/11/2015 Arrival Information Details Patient Name: Kurt Hill, Kurt Hill. Date of Service: 04/11/2015 11:00 AM Medical Record Number: [id number] Patient Account Number: [id number] Date of Birth/Sex: 10-26-1932 (81 y.o. Male) Treating RN: Afful, RN, BSN, Velva Harman Primary Care Physician: Tracie Harrier Other Clinician: Referring Physician: Marjean Donna Treating Physician/Extender: Frann Rider in Treatment: 20 Visit Information History Since Last Visit Any new allergies or adverse reactions: No Patient Arrived: Ambulatory Had a fall or experienced change in No Arrival Time: 11:07 activities of daily living that may affect Accompanied By: self risk of falls: Transfer Assistance: None Signs or symptoms of abuse/neglect since last No Patient Identification Verified: Yes visito Secondary Verification Process Yes Hospitalized since last visit: No Completed: Has Dressing in Place as Prescribed: No Patient Requires Transmission-Based No Pain Present Now: No Precautions: Patient Has Alerts: Yes Electronic Signature(s) Signed: 04/11/2015 3:22:58 PM By: Regan Lemming BSN, RN Entered By: Regan Lemming on 04/11/2015 11:07:37 Kurt Hill ([id number]) -------------------------------------------------------------------------------- Clinic Level of Care Assessment Details Patient Name: Kurt Rud D. Date of Service: 04/11/2015 11:00 AM Medical Record Number: [id number] Patient Account Number: [id number] Date of Birth/Sex: 12-08-1932 (81 y.o. Male) Treating RN: Junious Dresser Primary Care Physician: Tracie Harrier Other Clinician: Referring Physician: Marjean Donna Treating Physician/Extender: Frann Rider in Treatment: 20 Clinic Level of Care Assessment Items TOOL 4 Quantity Score []  - Use when only an EandM is performed on FOLLOW-UP visit 0 ASSESSMENTS - Nursing Assessment / Reassessment []  - Reassessment of Co-morbidities (includes  updates in patient status) 0 []  - Reassessment of Adherence to Treatment Plan 0 ASSESSMENTS - Wound and Skin Assessment / Reassessment []  - Simple Wound Assessment / Reassessment - one wound 0 []  - Complex Wound Assessment / Reassessment - multiple wounds 0 []  - Dermatologic / Skin Assessment (not related to wound area) 0 ASSESSMENTS - Focused Assessment X - Circumferential Edema Measurements - multi extremities 1 5 []  - Nutritional Assessment / Counseling / Intervention 0 X - Lower Extremity Assessment (monofilament, tuning fork, pulses) 1 5 []  - Peripheral Arterial Disease Assessment (using hand held doppler) 0 ASSESSMENTS - Ostomy and/or Continence Assessment and Care []  - Incontinence Assessment and Management 0 []  - Ostomy Care Assessment and Management (repouching, etc.) 0 PROCESS - Coordination of Care X - Simple Patient / Family Education for ongoing care 1 15 []  - Complex (extensive) Patient / Family Education for ongoing care 0 []  - Staff obtains Programmer, systems, Records, Test Results / Process Orders 0 []  - Staff telephones HHA, Nursing Homes / Clarify orders / etc 0 []  - Routine Transfer to another Facility (non-emergent condition) 0 Kurt Hill, [name] ([id number]) []  - Routine Hospital Admission (non-emergent condition) 0 []  - New Admissions / Biomedical engineer / Ordering NPWT, Apligraf, etc. 0 []  - Emergency Hospital Admission (emergent condition) 0 X - Simple Discharge Coordination 1 10 []  - Complex (extensive) Discharge Coordination 0 PROCESS - Special Needs []  - Pediatric / Minor Patient Management 0 []  - Isolation Patient Management 0 []  - Hearing / Language / Visual special needs 0 []  - Assessment of Community assistance (transportation, D/C planning, etc.) 0 []  - Additional assistance / Altered mentation 0 []  - Support Surface(s) Assessment (bed, cushion, seat, etc.) 0 INTERVENTIONS - Wound Cleansing / Measurement []  - Simple Wound Cleansing - one wound 0 []  -  Complex Wound Cleansing - multiple wounds 0 X - Wound Imaging (photographs - any number of wounds) 1 5 []  - Wound Tracing (instead of photographs)  0 []  - Simple Wound Measurement - one wound 0 []  - Complex Wound Measurement - multiple wounds 0 INTERVENTIONS - Wound Dressings []  - Small Wound Dressing one or multiple wounds 0 []  - Medium Wound Dressing one or multiple wounds 0 []  - Large Wound Dressing one or multiple wounds 0 []  - Application of Medications - topical 0 []  - Application of Medications - injection 0 INTERVENTIONS - Miscellaneous []  - External ear exam 0 Kurt Hill, Kurt D. ([id number]) []  - Specimen Collection (cultures, biopsies, blood, body fluids, etc.) 0 []  - Specimen(s) / Culture(s) sent or taken to Lab for analysis 0 []  - Patient Transfer (multiple staff / Harrel Lemon Lift / Similar devices) 0 []  - Simple Staple / Suture removal (25 or less) 0 []  - Complex Staple / Suture removal (26 or more) 0 []  - Hypo / Hyperglycemic Management (close monitor of Blood Glucose) 0 []  - Ankle / Brachial Index (ABI) - do not check if billed separately 0 X - Vital Signs 1 5 Has the patient been seen at the hospital within the last three years: Yes Total Score: 45 Level Of Care: New/Established - Level 2 Electronic Signature(s) Signed: 04/11/2015 4:57:42 PM By: Junious Dresser RN Entered By: Junious Dresser on 04/11/2015 11:29:57 Kurt Hill ([id number]) -------------------------------------------------------------------------------- Encounter Discharge Information Details Patient Name: Kurt Rud D. Date of Service: 04/11/2015 11:00 AM Medical Record Number: [id number] Patient Account Number: [id number] Date of Birth/Sex: 1932/10/29 (81 y.o. Male) Treating RN: Afful, RN, BSN, Velva Harman Primary Care Physician: Tracie Harrier Other Clinician: Referring Physician: Marjean Donna Treating Physician/Extender: Frann Rider in Treatment: 20 Encounter Discharge Information Items Discharge  Pain Level: 0 Discharge Condition: Stable Ambulatory Status: Ambulatory Discharge Destination: Home Transportation: Private Auto Accompanied By: self Schedule Follow-up Appointment: No Medication Reconciliation completed No and provided to Patient/Care Katalea Ucci: Patient Clinical Summary of Care: Declined Electronic Signature(s) Signed: 04/11/2015 11:36:26 AM By: Ruthine Dose Entered By: Ruthine Dose on 04/11/2015 11:36:26 Kurt Hill ([id number]) -------------------------------------------------------------------------------- Lower Extremity Assessment Details Patient Name: Kurt Rud D. Date of Service: 04/11/2015 11:00 AM Medical Record Number: [id number] Patient Account Number: [id number] Date of Birth/Sex: [birth year] (81 y.o. Male) Treating RN: Afful, RN, BSN, Velva Harman Primary Care Physician: Tracie Harrier Other Clinician: Referring Physician: Marjean Donna Treating Physician/Extender: Frann Rider in Treatment: 20 Edema Assessment Assessed: [Left: No] [Right: No] E[Left: dema] [Right: :] Calf Left: Right: Point of Measurement: 38 cm From Medial Instep 38.6 cm cm Ankle Left: Right: Point of Measurement: 12 cm From Medial Instep 25.4 cm cm Vascular Assessment Claudication: Claudication Assessment [Left:None] Pulses: Posterior Tibial Dorsalis Pedis Palpable: [Left:Yes] Extremity colors, hair growth, and conditions: Extremity Color: [Left:Mottled] Hair Growth on Extremity: [Left:No] Temperature of Extremity: [Left:Warm] Capillary Refill: [Left:< 3 seconds] Dependent Rubor: [Left:No] Blanched when Elevated: [Left:No] Lipodermatosclerosis: [Left:No] Toe Nail Assessment Left: Right: Thick: No Discolored: No Deformed: No Improper Length and Hygiene: No Kurt Hill, Kurt Hill ([id number]) Electronic Signature(s) Signed: 04/11/2015 3:22:58 PM By: Regan Lemming BSN, RN Entered By: Regan Lemming on 04/11/2015 11:15:43 Kurt Hill  ([id number]) -------------------------------------------------------------------------------- Multi Wound Chart Details Patient Name: Kurt Rud D. Date of Service: 04/11/2015 11:00 AM Medical Record Number: [id number] Patient Account Number: [id number] Date of Birth/Sex: June 03, 1932 (81 y.o. Male) Treating RN: Junious Dresser Primary Care Physician: Tracie Harrier Other Clinician: Referring Physician: Marjean Donna Treating Physician/Extender: Frann Rider in Treatment: 20 Vital Signs Height(in): 70 Pulse(bpm): 67 Weight(lbs): 176 Blood Pressure 144/66 (mmHg): Body Mass Index(BMI): 25 Temperature(F): 97.5 Respiratory Rate 16 (breaths/min): Photos: [1:No Photos] [  N/A:N/A] Wound Location: [1:Left Lower Leg - Anterior] [N/A:N/A] Wounding Event: [1:Trauma] [N/A:N/A] Primary Etiology: [1:Skin Tear] [N/A:N/A] Comorbid History: [1:Asthma, Confinement Anxiety] [N/A:N/A] Date Acquired: [1:11/21/2014] [N/A:N/A] Weeks of Treatment: [1:20] [N/A:N/A] Wound Status: [1:Open] [N/A:N/A] Measurements L x W x D 0.3x0.7x0.1 [N/A:N/A] (cm) Area (cm) : [1:0.165] [N/A:N/A] Volume (cm) : [1:0.016] [N/A:N/A] % Reduction in Area: [1:99.70%] [N/A:N/A] % Reduction in Volume: 99.80% [N/A:N/A] Classification: [1:Partial Thickness] [N/A:N/A] Exudate Amount: [1:Small] [N/A:N/A] Exudate Type: [1:Serous] [N/A:N/A] Exudate Color: [1:amber] [N/A:N/A] Wound Margin: [1:Flat and Intact] [N/A:N/A] Granulation Amount: [1:Large (67-100%)] [N/A:N/A] Granulation Quality: [1:Red] [N/A:N/A] Necrotic Amount: [1:Small (1-33%)] [N/A:N/A] Exposed Structures: [1:Fascia: No Fat: No Tendon: No Muscle: No Joint: No Bone: No] [N/A:N/A] Limited to Skin Breakdown Epithelialization: Small (1-33%) N/A N/A Periwound Skin Texture: Edema: No N/A N/A Excoriation: No Induration: No Callus: No Crepitus: No Fluctuance: No Friable: No Rash: No Scarring: No Periwound Skin Dry/Scaly: Yes N/A  N/A Moisture: Maceration: No Moist: No Periwound Skin Color: Erythema: Yes N/A N/A Atrophie Blanche: No Cyanosis: No Ecchymosis: No Hemosiderin Staining: No Mottled: No Pallor: No Rubor: No Temperature: No Abnormality N/A N/A Tenderness on Yes N/A N/A Palpation: Wound Preparation: Ulcer Cleansing: N/A N/A Rinsed/Irrigated with Saline Topical Anesthetic Applied: Other: lidocaine 4% Treatment Notes Electronic Signature(s) Signed: 04/11/2015 4:57:42 PM By: Junious Dresser RN Entered By: Junious Dresser on 04/11/2015 11:27:20 Kurt Hill ([id number]) -------------------------------------------------------------------------------- Multi-Disciplinary Care Plan Details Patient Name: Kurt Hill. Date of Service: 04/11/2015 11:00 AM Medical Record Number: [id number] Patient Account Number: [id number] Date of Birth/Sex: [birth year] (81 y.o. Male) Treating RN: Junious Dresser Primary Care Physician: Tracie Harrier Other Clinician: Referring Physician: Marjean Donna Treating Physician/Extender: Frann Rider in Treatment: 20 Active Inactive Electronic Signature(s) Signed: 04/11/2015 4:57:42 PM By: Junious Dresser RN Entered By: Junious Dresser on 04/11/2015 11:27:01 Kurt Hill ([id number]) -------------------------------------------------------------------------------- Pain Assessment Details Patient Name: Kurt Rud D. Date of Service: 04/11/2015 11:00 AM Medical Record Number: [id number] Patient Account Number: [id number] Date of Birth/Sex: July 27, 1932 (81 y.o. Male) Treating RN: Afful, RN, BSN, Velva Harman Primary Care Physician: Tracie Harrier Other Clinician: Referring Physician: Marjean Donna Treating Physician/Extender: Frann Rider in Treatment: 20 Active Problems Location of Pain Severity and Description of Pain Patient Has Paino No Site Locations Pain Management and Medication Current Pain Management: Electronic Signature(s) Signed: 04/11/2015 3:22:58  PM By: Regan Lemming BSN, RN Entered By: Regan Lemming on 04/11/2015 11:07:43 Kurt Hill ([id number]) -------------------------------------------------------------------------------- Patient/Caregiver Education Details Patient Name: Kurt Hill. Date of Service: 04/11/2015 11:00 AM Medical Record Number: [id number] Patient Account Number: [id number] Date of Birth/Gender: [birth year] (82 y.o. Male) Treating RN: Afful, RN, BSN, Velva Harman Primary Care Physician: Tracie Harrier Other Clinician: Referring Physician: Marjean Donna Treating Physician/Extender: Frann Rider in Treatment: 20 Education Assessment Education Provided To: Patient Education Topics Provided Basic Hygiene: Wound/Skin Impairment: Handouts: Other: D/C Electronic Signature(s) Signed: 04/11/2015 3:22:58 PM By: Regan Lemming BSN, RN Entered By: Regan Lemming on 04/11/2015 11:35:38 Kurt Hill ([id number]) -------------------------------------------------------------------------------- Wound Assessment Details Patient Name: Kurt Rud D. Date of Service: 04/11/2015 11:00 AM Medical Record Number: [id number] Patient Account Number: [id number] Date of Birth/Sex: 08/16/1932 (81 y.o. Male) Treating RN: Junious Dresser Primary Care Physician: Tracie Harrier Other Clinician: Referring Physician: Marjean Donna Treating Physician/Extender: Frann Rider in Treatment: 20 Wound Status Wound Number: 1 Primary Etiology: Skin Tear Wound Location: Left, Anterior Lower Leg Wound Status: Healed - Epithelialized Wounding Event: Trauma Comorbid History: Asthma, Confinement Anxiety Date Acquired: 11/21/2014 Weeks Of Treatment: 20 Clustered Wound: No Wound Measurements Length: (  cm) 0 % Reduction i Width: (cm) 0 % Reduction i Depth: (cm) 0 Epithelializa Area: (cm) 0 Tunneling: Volume: (cm) 0 Undermining: n Area: 100% n Volume: 100% tion: Small (1-33%) No No Wound Description Classification: Partial  Thickness Wound Margin: Flat and Intact Exudate Amount: Small Exudate Type: Serous Exudate Color: amber Foul Odor After Cleansing: No Wound Bed Granulation Amount: Large (67-100%) Exposed Structure Granulation Quality: Red Fascia Exposed: No Necrotic Amount: Small (1-33%) Fat Layer Exposed: No Necrotic Quality: Adherent Slough Tendon Exposed: No Muscle Exposed: No Joint Exposed: No Bone Exposed: No Limited to Skin Breakdown Periwound Skin Texture Texture Color No Abnormalities Noted: No No Abnormalities Noted: No Callus: No Atrophie Blanche: No Crepitus: No Cyanosis: No Excoriation: No Ecchymosis: No Fluctuance: No Erythema: Yes Kurt Hill, Kurt Hill ([id number]) Friable: No Hemosiderin Staining: No Induration: No Mottled: No Localized Edema: No Pallor: No Rash: No Rubor: No Scarring: No Temperature / Pain Moisture Temperature: No Abnormality No Abnormalities Noted: No Tenderness on Palpation: Yes Dry / Scaly: Yes Maceration: No Moist: No Wound Preparation Ulcer Cleansing: Rinsed/Irrigated with Saline Topical Anesthetic Applied: Other: lidocaine 4%, Electronic Signature(s) Signed: 04/11/2015 4:57:42 PM By: Junious Dresser RN Entered By: Junious Dresser on 04/11/2015 11:27:49 Kurt Hill ([id number]) -------------------------------------------------------------------------------- Vitals Details Patient Name: Kurt Rud D. Date of Service: 04/11/2015 11:00 AM Medical Record Number: [id number] Patient Account Number: [id number] Date of Birth/Sex: [birth year] (81 y.o. Male) Treating RN: Afful, RN, BSN, Velva Harman Primary Care Physician: Tracie Harrier Other Clinician: Referring Physician: Marjean Donna Treating Physician/Extender: Frann Rider in Treatment: 20 Vital Signs Time Taken: 11:15 Temperature (F): 97.5 Height (in): 70 Pulse (bpm): 67 Weight (lbs): 176 Respiratory Rate (breaths/min): 16 Body Mass Index (BMI): 25.3 Blood Pressure (mmHg):  144/66 Reference Range: 80 - 120 mg / dl Electronic Signature(s) Signed: 04/11/2015 3:22:58 PM By: Regan Lemming BSN, RN Entered By: Regan Lemming on 04/11/2015 11:15:08

## 2015-12-14 ENCOUNTER — Emergency Department: Payer: Medicare Other

## 2015-12-14 ENCOUNTER — Inpatient Hospital Stay
Admission: EM | Admit: 2015-12-14 | Discharge: 2015-12-15 | DRG: 192 | Disposition: A | Discharge status: Home / Self Care | Payer: Medicare Other | Attending: Specialist | Admitting: Specialist

## 2015-12-14 ENCOUNTER — Encounter: Payer: Self-pay | Admitting: Emergency Medicine

## 2015-12-14 DIAGNOSIS — Z7952 Long term (current) use of systemic steroids: Secondary | ICD-10-CM | POA: Diagnosis not present

## 2015-12-14 DIAGNOSIS — Z87891 Personal history of nicotine dependence: Secondary | ICD-10-CM

## 2015-12-14 DIAGNOSIS — Z882 Allergy status to sulfonamides status: Secondary | ICD-10-CM

## 2015-12-14 DIAGNOSIS — D649 Anemia, unspecified: Secondary | ICD-10-CM | POA: Diagnosis present

## 2015-12-14 DIAGNOSIS — E039 Hypothyroidism, unspecified: Secondary | ICD-10-CM | POA: Diagnosis present

## 2015-12-14 DIAGNOSIS — N400 Enlarged prostate without lower urinary tract symptoms: Secondary | ICD-10-CM | POA: Diagnosis present

## 2015-12-14 DIAGNOSIS — J209 Acute bronchitis, unspecified: Secondary | ICD-10-CM | POA: Diagnosis present

## 2015-12-14 DIAGNOSIS — Z23 Encounter for immunization: Secondary | ICD-10-CM

## 2015-12-14 DIAGNOSIS — I9589 Other hypotension: Secondary | ICD-10-CM | POA: Diagnosis present

## 2015-12-14 DIAGNOSIS — Z91041 Radiographic dye allergy status: Secondary | ICD-10-CM | POA: Diagnosis not present

## 2015-12-14 DIAGNOSIS — Z85828 Personal history of other malignant neoplasm of skin: Secondary | ICD-10-CM | POA: Diagnosis not present

## 2015-12-14 DIAGNOSIS — J440 Chronic obstructive pulmonary disease with acute lower respiratory infection: Secondary | ICD-10-CM | POA: Diagnosis present

## 2015-12-14 DIAGNOSIS — Z79899 Other long term (current) drug therapy: Secondary | ICD-10-CM | POA: Diagnosis not present

## 2015-12-14 DIAGNOSIS — J45909 Unspecified asthma, uncomplicated: Secondary | ICD-10-CM | POA: Diagnosis present

## 2015-12-14 DIAGNOSIS — R0902 Hypoxemia: Secondary | ICD-10-CM | POA: Diagnosis present

## 2015-12-14 DIAGNOSIS — J441 Chronic obstructive pulmonary disease with (acute) exacerbation: Secondary | ICD-10-CM | POA: Diagnosis present

## 2015-12-14 HISTORY — DX: Anemia, unspecified: D64.9

## 2015-12-14 HISTORY — DX: Unspecified asthma, uncomplicated: J45.909

## 2015-12-14 HISTORY — DX: Unspecified malignant neoplasm of skin, unspecified: C44.90

## 2015-12-14 HISTORY — DX: Hypothyroidism, unspecified: E03.9

## 2015-12-14 HISTORY — DX: Chronic obstructive pulmonary disease, unspecified: J44.9

## 2015-12-14 HISTORY — DX: Hypotension, unspecified: I95.9

## 2015-12-14 HISTORY — DX: Benign prostatic hyperplasia without lower urinary tract symptoms: N40.0

## 2015-12-14 HISTORY — DX: Disorder of the skin and subcutaneous tissue, unspecified: L98.9

## 2015-12-14 LAB — CBC
HEMATOCRIT: 36.9 % — AB (ref 40.0–52.0)
Hemoglobin: 12.2 g/dL — ABNORMAL LOW (ref 13.0–18.0)
MCH: 32 pg (ref 26.0–34.0)
MCHC: 33.1 g/dL (ref 32.0–36.0)
MCV: 96.7 fL (ref 80.0–100.0)
Platelets: 208 10*3/uL (ref 150–440)
RBC: 3.82 MIL/uL — AB (ref 4.40–5.90)
RDW: 13.7 % (ref 11.5–14.5)
WBC: 7.3 10*3/uL (ref 3.8–10.6)

## 2015-12-14 LAB — COMPREHENSIVE METABOLIC PANEL
ALBUMIN: 4.4 g/dL (ref 3.5–5.0)
ALT: 18 U/L (ref 17–63)
AST: 22 U/L (ref 15–41)
Alkaline Phosphatase: 60 U/L (ref 38–126)
Anion gap: 7 (ref 5–15)
BILIRUBIN TOTAL: 0.8 mg/dL (ref 0.3–1.2)
BUN: 27 mg/dL — ABNORMAL HIGH (ref 6–20)
CO2: 31 mmol/L (ref 22–32)
Calcium: 8.8 mg/dL — ABNORMAL LOW (ref 8.9–10.3)
Chloride: 103 mmol/L (ref 101–111)
Creatinine, Ser: 1.12 mg/dL (ref 0.61–1.24)
GFR calc Af Amer: >60 mL/min (ref >60)
GFR calc non Af Amer: 60 mL/min — ABNORMAL LOW (ref >60)
GLUCOSE: 90 mg/dL (ref 65–99)
POTASSIUM: 3.7 mmol/L (ref 3.5–5.1)
Sodium: 141 mmol/L (ref 135–145)
Total Protein: 7.4 g/dL (ref 6.5–8.1)

## 2015-12-14 LAB — TROPONIN I

## 2015-12-14 MED ORDER — VITAMIN D (ERGOCALCIFEROL) 1.25 MG (50000 UNIT) PO CAPS: 50000.0000 [IU] | ORAL_CAPSULE | ORAL | Status: DC

## 2015-12-14 MED ORDER — IPRATROPIUM-ALBUTEROL 0.5-2.5 (3) MG/3ML IN SOLN
RESPIRATORY_TRACT | Status: AC
  Administered 2015-12-14: 3 mL via RESPIRATORY_TRACT
  Filled 2015-12-14: qty 3

## 2015-12-14 MED ORDER — ENOXAPARIN SODIUM 40 MG/0.4ML ~~LOC~~ SOLN
40.0000 mg | SUBCUTANEOUS | Status: DC
  Administered 2015-12-14: 40 mg via SUBCUTANEOUS
  Filled 2015-12-14: qty 0.4

## 2015-12-14 MED ORDER — METHYLPREDNISOLONE SODIUM SUCC 125 MG IJ SOLR
60.0000 mg | Freq: Three times a day (TID) | INTRAMUSCULAR | Status: DC
  Administered 2015-12-14 – 2015-12-15 (×2): 60 mg via INTRAVENOUS
  Filled 2015-12-14 (×2): qty 2

## 2015-12-14 MED ORDER — IPRATROPIUM-ALBUTEROL 0.5-2.5 (3) MG/3ML IN SOLN
3.0000 mL | Freq: Once | RESPIRATORY_TRACT | Status: AC
  Administered 2015-12-14: 3 mL via RESPIRATORY_TRACT
  Filled 2015-12-14: qty 3

## 2015-12-14 MED ORDER — ACETAMINOPHEN 325 MG PO TABS
650.0000 mg | ORAL_TABLET | Freq: Four times a day (QID) | ORAL | Status: DC | PRN
Start: 2015-12-14 — End: 2015-12-15
  Administered 2015-12-15: 10:00:00 650 mg via ORAL
  Filled 2015-12-14: qty 2

## 2015-12-14 MED ORDER — AZITHROMYCIN 250 MG PO TABS
250.0000 mg | ORAL_TABLET | Freq: Every day | ORAL | Status: DC
  Administered 2015-12-15: 250 mg via ORAL
  Filled 2015-12-14: qty 1

## 2015-12-14 MED ORDER — IPRATROPIUM-ALBUTEROL 0.5-2.5 (3) MG/3ML IN SOLN
3.0000 mL | RESPIRATORY_TRACT | Status: DC
  Administered 2015-12-14 – 2015-12-15 (×6): 3 mL via RESPIRATORY_TRACT
  Filled 2015-12-14 (×6): qty 3

## 2015-12-14 MED ORDER — MIDODRINE HCL 5 MG PO TABS
2.5000 mg | ORAL_TABLET | Freq: Three times a day (TID) | ORAL | Status: DC
  Administered 2015-12-14 – 2015-12-15 (×2): 2.5 mg via ORAL
  Filled 2015-12-14 (×2): qty 1

## 2015-12-14 MED ORDER — MOMETASONE FURO-FORMOTEROL FUM 100-5 MCG/ACT IN AERO
2.0000 | INHALATION_SPRAY | Freq: Two times a day (BID) | RESPIRATORY_TRACT | Status: DC
  Administered 2015-12-14 – 2015-12-15 (×2): 2 via RESPIRATORY_TRACT
  Filled 2015-12-14: qty 8.8

## 2015-12-14 MED ORDER — PNEUMOCOCCAL VAC POLYVALENT 25 MCG/0.5ML IJ INJ
0.5000 mL | INJECTION | INTRAMUSCULAR | Status: AC
  Administered 2015-12-15: 0.5 mL via INTRAMUSCULAR
  Filled 2015-12-14: qty 0.5

## 2015-12-14 MED ORDER — METHYLPREDNISOLONE SODIUM SUCC 125 MG IJ SOLR
125.0000 mg | Freq: Once | INTRAMUSCULAR | Status: AC
  Administered 2015-12-14: 125 mg via INTRAVENOUS
  Filled 2015-12-14: qty 2

## 2015-12-14 MED ORDER — AZITHROMYCIN 250 MG PO TABS
500.0000 mg | ORAL_TABLET | Freq: Every day | ORAL | Status: AC
  Administered 2015-12-14: 18:00:00 500 mg via ORAL
  Filled 2015-12-14: qty 2

## 2015-12-14 MED ORDER — IPRATROPIUM-ALBUTEROL 0.5-2.5 (3) MG/3ML IN SOLN
3.0000 mL | Freq: Once | RESPIRATORY_TRACT | Status: AC
  Administered 2015-12-14: 3 mL via RESPIRATORY_TRACT

## 2015-12-14 MED ORDER — TAMSULOSIN HCL 0.4 MG PO CAPS
0.4000 mg | ORAL_CAPSULE | Freq: Every day | ORAL | Status: DC
  Filled 2015-12-14 (×2): qty 1

## 2015-12-14 MED ORDER — PREDNISONE 10 MG PO TABS: 50.0000 mg | ORAL_TABLET | Freq: Every day | ORAL | Status: DC

## 2015-12-14 MED ORDER — ACETAMINOPHEN 650 MG RE SUPP: 650.0000 mg | Freq: Four times a day (QID) | RECTAL | Status: DC | PRN

## 2015-12-14 MED ORDER — ONDANSETRON HCL 4 MG PO TABS: 4.0000 mg | ORAL_TABLET | Freq: Four times a day (QID) | ORAL | Status: DC | PRN

## 2015-12-14 MED ORDER — LEVOTHYROXINE SODIUM 50 MCG PO TABS
100.0000 ug | ORAL_TABLET | Freq: Every day | ORAL | Status: DC
  Administered 2015-12-14 – 2015-12-15 (×2): 100 ug via ORAL
  Filled 2015-12-14 (×2): qty 2

## 2015-12-14 MED ORDER — ONDANSETRON HCL 4 MG/2ML IJ SOLN: 4.0000 mg | Freq: Four times a day (QID) | INTRAMUSCULAR | Status: DC | PRN

## 2015-12-14 MED ORDER — ALBUTEROL SULFATE (2.5 MG/3ML) 0.083% IN NEBU: 2.5000 mg | INHALATION_SOLUTION | RESPIRATORY_TRACT | Status: DC | PRN

## 2015-12-14 NOTE — ED Notes (Signed)
Pt states hx of asthma. Pt states he went to PCP this morning and was sent by PCP. Per Careers adviser pt was 90% on RA, pt placed on 2L in triage. Pt audible wheezing heard in triage. Pt states he used his nebulizer this morning but did not have a full treatment.

## 2015-12-14 NOTE — H&P (Signed)
[facility] [place] NAME: Benard Desimone    MR#:  [id number]  DATE OF BIRTH:  06/24/1932  DATE OF ADMISSION:  12/14/2015  PRIMARY CARE PHYSICIAN: Tracie Harrier, MD   REQUESTING/REFERRING PHYSICIAN: Dr. Lisa Roca  CHIEF COMPLAINT:   Chief Complaint  Patient presents with  . Respiratory Distress    HISTORY OF PRESENT ILLNESS:  Wessley Montesi  is a 82 y.o. male with a known history of COPD and asthma, not on any home oxygen, anemia and benign prostatitic hypertrophy presents to the hospital secondary to worsening shortness of breath that started last night. Patient was never smoker, diagnosed with asthma when he was really young and then recently with COPD. Hasn't required any home oxygen, very independent at baseline. He does have time to time COPD flares for which he takes prednisone prescribed that improves his symptoms. He also has a nebulizer machine at home. He states that his breathing trouble started yesterday, associated with cough and also yellowish phlegm. Denies any fevers or chills. Complaints of weakness and decreased appetite. He tried a nebulizer at home but felt that he needed steroids and presented to the emergency room. He was wheezing here, received Solu-Medrol and nebulizer treatments. He was wanting to go home, however when he stood up to walk, his sats dropped to 87%. So he is being admitted for COPD exacerbation. Chest x-ray is clear though.  PAST MEDICAL HISTORY:   Past Medical History  Diagnosis Date  . Asthma   . Skin abnormalities   . Skin cancer   . Hypotension   . COPD (chronic obstructive pulmonary disease) (HCC)     not on home oxygen  . Anemia   . BPH (benign prostatic hyperplasia)   . Hypothyroidism     PAST SURGICAL HISTORY:   Past Surgical History  Procedure Laterality Date  . Mohs surgery    . Tonsillectomy    . Appendectomy    . Bone resection      on nose    SOCIAL HISTORY:   Social  History  Substance Use Topics  . Smoking status: Never Smoker   . Smokeless tobacco: Never Used     Comment: tried cigarettes when young- but never really smoked  . Alcohol Use: No    FAMILY HISTORY:  History reviewed. No pertinent family history.  DRUG ALLERGIES:   Allergies  Allergen Reactions  . Ivp Dye [Iodinated Diagnostic Agents] Itching and Rash  . Sulfa Antibiotics Rash    REVIEW OF SYSTEMS:   Review of Systems  Constitutional: Positive for malaise/fatigue. Negative for fever, chills and weight loss.  HENT: Negative for ear discharge, ear pain, hearing loss, nosebleeds and tinnitus.   Eyes: Negative for blurred vision, double vision and photophobia.  Respiratory: Positive for cough, sputum production, shortness of breath and wheezing. Negative for hemoptysis.   Cardiovascular: Positive for leg swelling. Negative for chest pain, palpitations and orthopnea.  Gastrointestinal: Negative for heartburn, nausea, vomiting, abdominal pain, diarrhea, constipation and melena.  Genitourinary: Negative for dysuria, urgency, frequency and hematuria.  Musculoskeletal: Negative for myalgias, back pain and neck pain.  Skin: Positive for rash.  Neurological: Negative for dizziness, tremors, sensory change, speech change, focal weakness and headaches.  Endo/Heme/Allergies: Does not bruise/bleed easily.  Psychiatric/Behavioral: Negative for depression.    MEDICATIONS AT HOME:   Prior to Admission medications   Medication Sig Start Date End Date Taking? Authorizing Provider  albuterol (PROAIR HFA) 108 (90 Base) MCG/ACT inhaler Inhale  2 puffs into the lungs every 6 (six) hours as needed for wheezing or shortness of breath.  12/06/15 12/05/16 Yes Historical Provider, MD  albuterol (PROVENTIL) (2.5 MG/3ML) 0.083% nebulizer solution Take 2.5 mg by nebulization every 6 (six) hours as needed for wheezing or shortness of breath.   Yes Historical Provider, MD  cephALEXin (KEFLEX) 500 MG capsule  Take 1 capsule by mouth 3 (three) times daily. 12/06/15  Yes Historical Provider, MD  levothyroxine (SYNTHROID, LEVOTHROID) 100 MCG tablet Take 1 tablet by mouth daily. 09/04/15  Yes Historical Provider, MD  midodrine (PROAMATINE) 2.5 MG tablet Take 1 tablet by mouth 3 (three) times daily. 10/19/15  Yes Historical Provider, MD  tamsulosin (FLOMAX) 0.4 MG CAPS capsule Take 1 capsule by mouth daily. 09/10/15  Yes Historical Provider, MD  Vitamin D, Ergocalciferol, (DRISDOL) 50000 units CAPS capsule Take 1 capsule by mouth every 7 (seven) days. 09/04/15  Yes Historical Provider, MD  predniSONE (DELTASONE) 10 MG tablet Take 5 tablets (50 mg total) by mouth daily. 12/14/15   Lisa Roca, MD      VITAL SIGNS:  Blood pressure 158/81, pulse 70, temperature 98.4 F (36.9 C), temperature source Oral, resp. rate 18, height 5\' 10"  (1.778 m), weight 68.947 kg (152 lb), SpO2 93 %.  PHYSICAL EXAMINATION:   Physical Exam  GENERAL:  82 y.o.-year-old patient lying in the bed with no acute distress.  EYES: Pupils equal, round, reactive to light and accommodation. No scleral icterus. Extraocular muscles intact. Uses reading glasses. HEENT: Head atraumatic, normocephalic. Oropharynx and nasopharynx clear.  NECK:  Supple, no jugular venous distention. No thyroid enlargement, no tenderness.  LUNGS: Scattered wheeze and rhonchi posteriorly at the bases. No rales or crackles. Moving air bilaterally. No use of accessory muscles of respiration.  CARDIOVASCULAR: S1, S2 normal. No murmurs, rubs, or gallops.  ABDOMEN: Soft, nontender, nondistended. Bowel sounds present. No organomegaly or mass.  EXTREMITIES: No cyanosis, or clubbing. 1+ pedal edema, chronic erythematous skin changes on both legs. NEUROLOGIC: Cranial nerves II through XII are intact. Muscle strength 5/5 in all extremities. Sensation intact. Gait not checked.  PSYCHIATRIC: The patient is alert and oriented x 3.  SKIN: No obvious rash, lesion, or ulcer.  Scratching marks on the back and chronic skin changes on both legs  LABORATORY PANEL:   CBC  Recent Labs Lab 12/14/15 1055  WBC 7.3  HGB 12.2*  HCT 36.9*  PLT 208   ------------------------------------------------------------------------------------------------------------------  Chemistries   Recent Labs Lab 12/14/15 1055  NA 141  K 3.7  CL 103  CO2 31  GLUCOSE 90  BUN 27*  CREATININE 1.12  CALCIUM 8.8*  AST 22  ALT 18  ALKPHOS 60  BILITOT 0.8   ------------------------------------------------------------------------------------------------------------------  Cardiac Enzymes  Recent Labs Lab 12/14/15 1055  TROPONINI <0.03   ------------------------------------------------------------------------------------------------------------------  RADIOLOGY:  Dg Chest Port 1 View  12/14/2015  CLINICAL DATA:  Shortness of breath and cough EXAM: PORTABLE CHEST 1 VIEW COMPARISON:  03/20/2014 FINDINGS: Normal heart size for technique. Stable aortic tortuosity. There is no edema, consolidation, effusion, or pneumothorax. Chronic large lung volumes. IMPRESSION: Stable.  No evidence of acute disease. Electronically Signed   By: Monte Fantasia M.D.   On: 12/14/2015 11:18    EKG:   Orders placed or performed during the hospital encounter of 12/14/15  . EKG test  . EKG test  . EKG 12-Lead  . EKG 12-Lead    IMPRESSION AND PLAN:   Robi Warstler  is a 82 y.o. male with  a known history of COPD and asthma, not on any home oxygen, anemia and benign prostatitic hypertrophy presents to the hospital secondary to worsening shortness of breath that started last night.  #1 acute on chronic COPD exacerbation with possible acute bronchitis-admit, IV Solu-Medrol. -Duonebs, start on dulera inhaler. -Z-Pak for bronchitis symptoms. -Currently requiring oxygen. Continue to wean and ambulate and check ambulatory sats tomorrow a.m.  #2 chronic anemia-stable  #3 hypothyroidism-on  Synthroid  #4 BPH-on Flomax  #5 DVT prophylaxis-on Lovenox  #6 chronic hypotension-on midodrine  All the records are reviewed and case discussed with ED provider. Management plans discussed with the patient, family and they are in agreement.  CODE STATUS: Full Code  TOTAL TIME TAKING CARE OF THIS PATIENT: 50 minutes.    Gladstone Lighter M.D on 12/14/2015 at 1:24 PM  Between 7am to 6pm - Pager - [phone]  After 6pm go to [email or web address] - password [email or web address]  Office  [phone]  CC: Primary care physician; Tracie Harrier, MD

## 2015-12-14 NOTE — ED Notes (Signed)
Pt had removed nasal cannula; states he wanted to see what his oxygen was w/out it, expresses he does not want to stay in the hospital.  Pt O2 sat 95% on room air.  Admitting MD notified.

## 2015-12-14 NOTE — Discharge Instructions (Signed)
You were evaluated for shortness of breath and found to have COPD exacerbation. You're being treated with prescription for prednisone. Use your albuterol nebulizer about every 4 hours as needed for shortness of breath and wheezing.  Return to the emergency room for any worsening condition including fever, trouble breathing, or chest pain, or any altered mental status or confusion.   Chronic Obstructive Pulmonary Disease Exacerbation Chronic obstructive pulmonary disease (COPD) is a common lung problem. In COPD, the flow of air from the lungs is limited. COPD exacerbations are times that breathing gets worse and you need extra treatment. Without treatment they can be life threatening. If they happen often, your lungs can become more damaged. If your COPD gets worse, your doctor may treat you with:  Medicines.  Oxygen.  Different ways to clear your airway, such as using a mask. HOME CARE  Do not smoke.  Avoid tobacco smoke and other things that bother your lungs.  If given, take your antibiotic medicine as told. Finish the medicine even if you start to feel better.  Only take medicines as told by your doctor.  Drink enough fluids to keep your pee (urine) clear or pale yellow (unless your doctor has told you not to).  Use a cool mist machine (vaporizer).  If you use oxygen or a machine that turns liquid medicine into a mist (nebulizer), continue to use them as told.  Keep up with shots (vaccinations) as told by your doctor.  Exercise regularly.  Eat healthy foods.  Keep all doctor visits as told. GET HELP RIGHT AWAY IF:  You are very short of breath and it gets worse.  You have trouble talking.  You have bad chest pain.  You have blood in your spit (sputum).  You have a fever.  You keep throwing up (vomiting).  You feel weak, or you pass out (faint).  You feel confused.  You keep getting worse. MAKE SURE YOU:  Understand these instructions.  Will watch your  condition.  Will get help right away if you are not doing well or get worse.   This information is not intended to replace advice given to you by your health care provider. Make sure you discuss any questions you have with your health care provider.   Document Released: 11/14/2011 Document Revised: 12/16/2014 Document Reviewed: 07/30/2013 Elsevier Interactive Patient Education [employer].

## 2015-12-14 NOTE — ED Notes (Signed)
Pt states coughing and SOB since yesterday, states hx of COPD and asthma, pt states he tried to use nebulizer and inhaler at home with no relief, pt arrives wheezing and SOB, pt placed on duoneb, able to speak in clear full sentances, family at bedside

## 2015-12-14 NOTE — ED Provider Notes (Addendum)
[facility] Emergency Department Provider Note   ____________________________________________  Time seen:  I have reviewed the triage vital signs and the triage nursing note.  HISTORY  Chief Complaint Respiratory Distress   Historian Patient  HPI Kurt Hill is a 82 y.o. male with a history of COPD, who is had increased shortness of breath since yesterday. He took several nebulizer treatments of albuterol yesterday. He states he probably needs prednisone.  Does not use home O2. He has not had any chest pain or syncope. Symptoms are moderate. He does have a primary care physician. No exacerbating or alleviating factors.    Past Medical History  Diagnosis Date  . Asthma   . Skin abnormalities   . Skin cancer   . Hypotension     There are no active problems to display for this patient.   Past Surgical History  Procedure Laterality Date  . Mohs surgery      Current Outpatient Rx  Name  Route  Sig  Dispense  Refill  . predniSONE (DELTASONE) 10 MG tablet   Oral   Take 5 tablets (50 mg total) by mouth daily.   20 tablet   0     Allergies Ivp dye and Sulfa antibiotics  History reviewed. No pertinent family history.  Social History Social History  Substance Use Topics  . Smoking status: Former Research scientist (life sciences)  . Smokeless tobacco: Never Used  . Alcohol Use: No    Review of Systems  Constitutional: Negative for fever. Eyes: Negative for visual changes. ENT: Negative for sore throat. Cardiovascular: Negative for chest pain. Respiratory: Positive for shortness of breath. Gastrointestinal: Negative for abdominal pain, vomiting and diarrhea. Genitourinary: Negative for dysuria. Musculoskeletal: Negative for back pain. Skin: Negative for rash. Neurological: Negative for headache. 10 point Review of Systems otherwise negative ____________________________________________   PHYSICAL EXAM:  VITAL SIGNS: ED Triage Vitals  Enc Vitals Group      BP 12/14/15 1034 92/52 mmHg     Pulse Rate 12/14/15 1034 73     Resp 12/14/15 1034 22     Temp 12/14/15 1034 98.4 F (36.9 C)     Temp Source 12/14/15 1034 Oral     SpO2 12/14/15 1034 90 %     Weight 12/14/15 1034 152 lb (68.947 kg)     Height 12/14/15 1034 5\' 10"  (1.778 m)     Head Cir --      Peak Flow --      Pain Score --      Pain Loc --      Pain Edu? --      Excl. in [place]? --      Constitutional: Alert and oriented. Well appearing and in no distress. Eyes: Conjunctivae are normal. PERRL. Normal extraocular movements. ENT   Head: Normocephalic and atraumatic.   Nose: No congestion/rhinnorhea.   Mouth/Throat: Mucous membranes are moist.   Neck: No stridor. Cardiovascular/Chest: Normal rate, regular rhythm.  No murmurs, rubs, or gallops. Respiratory: Normal respiratory effort without tachypnea nor retractions. Mild and expiratory wheezing. Good air movement. No rhonchi.  Gastrointestinal: Soft. No distention, no guarding, no rebound. Nontender   Genitourinary/rectal:Deferred Musculoskeletal: Nontender with normal range of motion in all extremities. No joint effusions.  No lower extremity tenderness.  No edema. Neurologic:  Normal speech and language. No gross or focal neurologic deficits are appreciated. Skin:  Skin is warm, dry and intact. No rash noted. Psychiatric: Mood and affect are normal. Speech and behavior are normal. Patient exhibits  appropriate insight and judgment.  ____________________________________________   EKG I, Lisa Roca, MD, the attending physician have personally viewed and interpreted all ECGs.  77bpm. normal sinus rhythm. Short PR. Narrow QRS. Normal axis. Nonspecific ST and T-wave ____________________________________________  LABS (pertinent positives/negatives)  White blood cell count 7.3, hemoglobin 12.2 and platelet count 28 Comprehensive metabolic panel without significant abnormalities Troponin less than  0.03  ____________________________________________  RADIOLOGY All Xrays were viewed by me. Imaging interpreted by Radiologist.  Chest portable 1 view: Stable no evidence of acute disease. __________________________________________  PROCEDURES  Procedure(s) performed: None  Critical Care performed: None  ____________________________________________   ED COURSE / ASSESSMENT AND PLAN  CONSULTATIONS: None  Pertinent labs & imaging results that were available during my care of the patient were reviewed by me and considered in my medical decision making (see chart for details).  This patient is overall well-appearing with clinical COPD exacerbation. After DuoNeb treatment he feels much better. He was given one dose of soluMedrol here. His laboratory examination and chest x-ray are reassuring.   When the patient was walked, his O2 sat did drop to 87%, patient was walked back to his room and placed on oxygen. Patient will be admitted for observation and treatment of his COPD exacerbation with hypoxia.   Patient / Family / Caregiver informed of clinical course, medical decision-making process, and agree with plan.    ___________________________________________   FINAL CLINICAL IMPRESSION(S) / ED DIAGNOSES   Final diagnoses:  COPD exacerbation ([place])  Hypoxia       Lisa Roca, MD 12/14/15 [place], MD 12/14/15 1258

## 2015-12-15 DIAGNOSIS — J440 Chronic obstructive pulmonary disease with acute lower respiratory infection: Secondary | ICD-10-CM | POA: Diagnosis not present

## 2015-12-15 MED ORDER — BUDESONIDE-FORMOTEROL FUMARATE 80-4.5 MCG/ACT IN AERO: 2.0000 | INHALATION_SPRAY | Freq: Two times a day (BID) | RESPIRATORY_TRACT | Status: DC

## 2015-12-15 MED ORDER — PREDNISONE 10 MG PO TABS: ORAL_TABLET | ORAL | Status: DC

## 2015-12-15 MED ORDER — AZITHROMYCIN 500 MG PO TABS: 250.0000 mg | ORAL_TABLET | Freq: Every day | ORAL | Status: DC

## 2015-12-15 NOTE — Discharge Summary (Signed)
[place] [facility] [place] NAME: Kurt Hill    MR#:  [id number]  DATE OF BIRTH:  August 10, 1932  DATE OF ADMISSION:  12/14/2015 ADMITTING PHYSICIAN: Gladstone Lighter, MD  DATE OF DISCHARGE: 12/15/2015  3:03 PM  PRIMARY CARE PHYSICIAN: Tracie Harrier, MD    ADMISSION DIAGNOSIS:  Hypoxia [R09.02] COPD exacerbation ([place]) [J44.1]  DISCHARGE DIAGNOSIS:  Active Problems:   COPD exacerbation ([place])   SECONDARY DIAGNOSIS:   Past Medical History  Diagnosis Date  . Asthma   . Skin abnormalities   . Skin cancer   . Hypotension   . COPD (chronic obstructive pulmonary disease) (HCC)     not on home oxygen  . Anemia   . BPH (benign prostatic hyperplasia)   . Hypothyroidism     HOSPITAL COURSE:   82 year old male with past medical history of COPD, hypertension, asthma, BPH, hypothyroidism, chronic hypotension who presented to the hospital due to shortness of breath and noted to be in COPD exacerbation.  #1 acute on chronic COPD exacerbation with possible acute bronchitis- pt. Was admitted to the hospital and started on aggressive therapy with IV steroids, duonebs ATC, Zithromax for bronchitis.   - will aggressive therapy pt. Has improved and therefore being discharged home.  -He is being discharged on prednisone taper, Zithromax, Symbicort. He was ambulated and did not qualify for home oxygen prior to discharge.  #2 chronic anemia-she is hemoglobin remained stable he did not require any transfusions.  #3 hypothyroidism-he will continue his Synthroid  #4 BPH- he will continue his Flomax  #6 chronic hypotension- he will cont. Midodrine.   DISCHARGE CONDITIONS:   Stable  CONSULTS OBTAINED:     DRUG ALLERGIES:   Allergies  Allergen Reactions  . Ivp Dye [Iodinated Diagnostic Agents] Itching and Rash  . Sulfa Antibiotics Rash    DISCHARGE MEDICATIONS:   Discharge Medication List as of 12/15/2015  1:53 PM    START taking these  medications   Details  azithromycin (ZITHROMAX) 500 MG tablet Take 0.5 tablets (250 mg total) by mouth daily., Starting 12/15/2015, Until Discontinued, Print    budesonide-formoterol (SYMBICORT) 80-4.5 MCG/ACT inhaler Inhale 2 puffs into the lungs 2 (two) times daily., Starting 12/15/2015, Until Discontinued, Print    predniSONE (DELTASONE) 10 MG tablet Label  & dispense according to the schedule below. 5 Pills PO for 1 day then, 4 Pills PO for 1 day, 3 Pills PO for 1 day, 2 Pills PO for 1 day, 1 Pill PO for 1 days then STOP., Print      CONTINUE these medications which have NOT CHANGED   Details  albuterol (PROAIR HFA) 108 (90 Base) MCG/ACT inhaler Inhale 2 puffs into the lungs every 6 (six) hours as needed for wheezing or shortness of breath. , Starting 12/06/2015, Until Thu 12/05/16, Historical Med    albuterol (PROVENTIL) (2.5 MG/3ML) 0.083% nebulizer solution Take 2.5 mg by nebulization every 6 (six) hours as needed for wheezing or shortness of breath., Until Discontinued, Historical Med    levothyroxine (SYNTHROID, LEVOTHROID) 100 MCG tablet Take 1 tablet by mouth daily., Starting 09/04/2015, Until Discontinued, Historical Med    midodrine (PROAMATINE) 2.5 MG tablet Take 1 tablet by mouth 3 (three) times daily., Starting 10/19/2015, Until Discontinued, Historical Med    tamsulosin (FLOMAX) 0.4 MG CAPS capsule Take 1 capsule by mouth daily., Starting 09/10/2015, Until Discontinued, Historical Med    Vitamin D, Ergocalciferol, (DRISDOL) 50000 units CAPS capsule Take 1 capsule by mouth every 7 (  seven) days., Starting 09/04/2015, Until Discontinued, Historical Med      STOP taking these medications     cephALEXin (KEFLEX) 500 MG capsule          DISCHARGE INSTRUCTIONS:   DIET:  Regular diet  DISCHARGE CONDITION:  Stable  ACTIVITY:  Activity as tolerated  OXYGEN:  Home Oxygen: No.   Oxygen Delivery: room air  DISCHARGE LOCATION:  home   If you experience worsening of your  admission symptoms, develop shortness of breath, life threatening emergency, suicidal or homicidal thoughts you must seek medical attention immediately by calling 911 or calling your MD immediately  if symptoms less severe.  You Must read complete instructions/literature along with all the possible adverse reactions/side effects for all the Medicines you take and that have been prescribed to you. Take any new Medicines after you have completely understood and accpet all the possible adverse reactions/side effects.   Please note  You were cared for by a hospitalist during your hospital stay. If you have any questions about your discharge medications or the care you received while you were in the hospital after you are discharged, you can call the unit and asked to speak with the hospitalist on call if the hospitalist that took care of you is not available. Once you are discharged, your primary care physician will handle any further medical issues. Please note that NO REFILLS for any discharge medications will be authorized once you are discharged, as it is imperative that you return to your primary care physician (or establish a relationship with a primary care physician if you do not have one) for your aftercare needs so that they can reassess your need for medications and monitor your lab values.     Today   Wheezing, shortness of breath much improved. He wants to go home  VITAL SIGNS:  Blood pressure 153/66, pulse 107, temperature 97.8 F (36.6 C), temperature source Oral, resp. rate 18, height 5\' 8"  (1.727 m), weight 71.838 kg (158 lb 6 oz), SpO2 93 %.  I/O:   Intake/Output Summary (Last 24 hours) at 12/15/15 1702 Last data filed at 12/15/15 0800  Gross per 24 hour  Intake    240 ml  Output      0 ml  Net    240 ml    PHYSICAL EXAMINATION:  GENERAL:  82 y.o.-year-old patient lying in the bed with no acute distress.  EYES: Pupils equal, round, reactive to light and accommodation. No  scleral icterus. Extraocular muscles intact.  HEENT: Head atraumatic, normocephalic. Oropharynx and nasopharynx clear.  NECK:  Supple, no jugular venous distention. No thyroid enlargement, no tenderness.  LUNGS: Normal breath sounds bilaterally, minimal end expiratory wheezing b/l, No rales,rhonchi. No use of accessory muscles of respiration.  CARDIOVASCULAR: S1, S2 normal. No murmurs, rubs, or gallops.  ABDOMEN: Soft, non-tender, non-distended. Bowel sounds present. No organomegaly or mass.  EXTREMITIES: No pedal edema, cyanosis, or clubbing.  NEUROLOGIC: Cranial nerves II through XII are intact. No focal motor or sensory defecits b/l.  PSYCHIATRIC: The patient is alert and oriented x 3. Good affect.  SKIN: No obvious rash, lesion, or ulcer.   DATA REVIEW:   CBC  Recent Labs Lab 12/14/15 1055  WBC 7.3  HGB 12.2*  HCT 36.9*  PLT 208    Chemistries   Recent Labs Lab 12/14/15 1055  NA 141  K 3.7  CL 103  CO2 31  GLUCOSE 90  BUN 27*  CREATININE 1.12  CALCIUM 8.8*  AST 22  ALT 18  ALKPHOS 60  BILITOT 0.8    Cardiac Enzymes  Recent Labs Lab 12/14/15 1055  TROPONINI <0.03    RADIOLOGY:  Dg Chest Port 1 View  12/14/2015  CLINICAL DATA:  Shortness of breath and cough EXAM: PORTABLE CHEST 1 VIEW COMPARISON:  03/20/2014 FINDINGS: Normal heart size for technique. Stable aortic tortuosity. There is no edema, consolidation, effusion, or pneumothorax. Chronic large lung volumes. IMPRESSION: Stable.  No evidence of acute disease. Electronically Signed   By: Monte Fantasia M.D.   On: 12/14/2015 11:18      Management plans discussed with the patient, family and they are in agreement.  CODE STATUS:     Code Status Orders        Start     Ordered   12/14/15 1504  Full code   Continuous     12/14/15 1503      TOTAL TIME TAKING CARE OF THIS PATIENT: 40 minutes.    Henreitta Leber M.D on 12/15/2015 at 5:02 PM  Between 7am to 6pm - Pager - [phone]  After  6pm go to [email or web address] - password [email or web address] [place] [facility]  Office  [phone]  CC: Primary care physician; Tracie Harrier, MD

## 2015-12-15 NOTE — Progress Notes (Signed)
Pt alert and oriented, he is being discharged today. Discharge instructions reviewed with pt, IV removed, prescriptions given to pt.

## 2016-01-10 ENCOUNTER — Encounter: Payer: Self-pay | Admitting: Urgent Care

## 2016-01-10 DIAGNOSIS — Z79899 Other long term (current) drug therapy: Secondary | ICD-10-CM | POA: Insufficient documentation

## 2016-01-10 DIAGNOSIS — Z792 Long term (current) use of antibiotics: Secondary | ICD-10-CM | POA: Insufficient documentation

## 2016-01-10 DIAGNOSIS — Z7951 Long term (current) use of inhaled steroids: Secondary | ICD-10-CM | POA: Insufficient documentation

## 2016-01-10 DIAGNOSIS — Z7952 Long term (current) use of systemic steroids: Secondary | ICD-10-CM | POA: Diagnosis not present

## 2016-01-10 DIAGNOSIS — L03116 Cellulitis of left lower limb: Secondary | ICD-10-CM | POA: Diagnosis present

## 2016-01-10 LAB — CBC
HEMATOCRIT: 34.1 % — AB (ref 40.0–52.0)
Hemoglobin: 11.5 g/dL — ABNORMAL LOW (ref 13.0–18.0)
MCH: 32.2 pg (ref 26.0–34.0)
MCHC: 33.7 g/dL (ref 32.0–36.0)
MCV: 95.5 fL (ref 80.0–100.0)
PLATELETS: 248 10*3/uL (ref 150–440)
RBC: 3.57 MIL/uL — ABNORMAL LOW (ref 4.40–5.90)
RDW: 13.6 % (ref 11.5–14.5)
WBC: 4 10*3/uL (ref 3.8–10.6)

## 2016-01-10 NOTE — ED Notes (Addendum)
Patient with redness and swelling to LLE; open wound noted to posterior aspect over achilles area. Denies fever. With further inspection, patient with the same redness and swelling noted contralaterally. Patien open, excoriated area to RIGHT distal tib/fib area.

## 2016-01-11 ENCOUNTER — Emergency Department
Admission: EM | Admit: 2016-01-11 | Discharge: 2016-01-11 | Disposition: A | Discharge status: Home / Self Care | Payer: Medicare Other | Attending: Emergency Medicine | Admitting: Emergency Medicine

## 2016-01-11 DIAGNOSIS — L03116 Cellulitis of left lower limb: Secondary | ICD-10-CM | POA: Diagnosis not present

## 2016-01-11 LAB — BASIC METABOLIC PANEL
Anion gap: 7 (ref 5–15)
BUN: 23 mg/dL — AB (ref 6–20)
CHLORIDE: 105 mmol/L (ref 101–111)
CO2: 28 mmol/L (ref 22–32)
CREATININE: 1.35 mg/dL — AB (ref 0.61–1.24)
Calcium: 8.9 mg/dL (ref 8.9–10.3)
GFR calc Af Amer: 55 mL/min — ABNORMAL LOW (ref >60)
GFR calc non Af Amer: 48 mL/min — ABNORMAL LOW (ref >60)
Glucose, Bld: 109 mg/dL — ABNORMAL HIGH (ref 65–99)
Potassium: 4.4 mmol/L (ref 3.5–5.1)
SODIUM: 140 mmol/L (ref 135–145)

## 2016-01-11 MED ORDER — CLINDAMYCIN HCL 150 MG PO CAPS
300.0000 mg | ORAL_CAPSULE | Freq: Once | ORAL | Status: AC
  Administered 2016-01-11: 300 mg via ORAL
  Filled 2016-01-11: qty 2

## 2016-01-11 MED ORDER — CLINDAMYCIN HCL 300 MG PO CAPS: 300.0000 mg | ORAL_CAPSULE | Freq: Three times a day (TID) | ORAL | Status: DC

## 2016-01-11 MED ORDER — CEPHALEXIN 500 MG PO CAPS: 500.0000 mg | ORAL_CAPSULE | Freq: Two times a day (BID) | ORAL | Status: AC

## 2016-01-11 NOTE — ED Provider Notes (Signed)
[facility] Emergency Department Provider Note  ____________________________________________  Time seen: 3:00 AM  I have reviewed the triage vital signs and the nursing notes.   HISTORY  Chief Complaint Cellulitis      HPI Kurt Hill is a 82 y.o. male resents with left posterior lower leg wound and surrounding redness 2 days. Patient admits to history of cellulitis in the past. Patient denies any fever no nausea or vomiting.    Past Medical History  Diagnosis Date  . Asthma   . Skin abnormalities   . Skin cancer   . Hypotension   . COPD (chronic obstructive pulmonary disease) (HCC)     not on home oxygen  . Anemia   . BPH (benign prostatic hyperplasia)   . Hypothyroidism     Patient Active Problem List   Diagnosis Date Noted  . COPD exacerbation ([place]) 12/14/2015    Past Surgical History  Procedure Laterality Date  . Mohs surgery    . Tonsillectomy    . Appendectomy    . Bone resection      on nose    Current Outpatient Rx  Name  Route  Sig  Dispense  Refill  . albuterol (PROAIR HFA) 108 (90 Base) MCG/ACT inhaler   Inhalation   Inhale 2 puffs into the lungs every 6 (six) hours as needed for wheezing or shortness of breath.          Marland Kitchen albuterol (PROVENTIL) (2.5 MG/3ML) 0.083% nebulizer solution   Nebulization   Take 2.5 mg by nebulization every 6 (six) hours as needed for wheezing or shortness of breath.         . budesonide-formoterol (SYMBICORT) 80-4.5 MCG/ACT inhaler   Inhalation   Inhale 2 puffs into the lungs 2 (two) times daily.   1 Inhaler   12   . finasteride (PROSCAR) 5 MG tablet   Oral   Take 5 mg by mouth daily.         Marland Kitchen levothyroxine (SYNTHROID, LEVOTHROID) 100 MCG tablet   Oral   Take 1 tablet by mouth daily.         . Vitamin D, Ergocalciferol, (DRISDOL) 50000 units CAPS capsule   Oral   Take 1 capsule by mouth every 7 (seven) days.         Marland Kitchen azithromycin (ZITHROMAX) 500 MG tablet   Oral  Take 0.5 tablets (250 mg total) by mouth daily. Patient not taking: Reported on 01/11/2016   5 tablet   0   . predniSONE (DELTASONE) 10 MG tablet      Label  & dispense according to the schedule below. 5 Pills PO for 1 day then, 4 Pills PO for 1 day, 3 Pills PO for 1 day, 2 Pills PO for 1 day, 1 Pill PO for 1 days then STOP. Patient not taking: Reported on 01/11/2016   15 tablet   0     Allergies Ivp dye and Sulfa antibiotics  No family history on file.  Social History Social History  Substance Use Topics  . Smoking status: Never Smoker   . Smokeless tobacco: Never Used     Comment: tried cigarettes when young- but never really smoked  . Alcohol Use: No    Review of Systems  Constitutional: Negative for fever. Eyes: Negative for visual changes. ENT: Negative for sore throat. Cardiovascular: Negative for chest pain. Respiratory: Negative for shortness of breath. Gastrointestinal: Negative for abdominal pain, vomiting and diarrhea. Genitourinary: Negative for dysuria. Musculoskeletal: Negative  for back pain. Skin: Positive for bilateral leg redness and left lower extremity wound Neurological: Negative for headaches, focal weakness or numbness.   10-point ROS otherwise negative.  ____________________________________________   PHYSICAL EXAM:  VITAL SIGNS: ED Triage Vitals  Enc Vitals Group     BP 01/10/16 2314 150/56 mmHg     Pulse Rate 01/10/16 2314 77     Resp 01/10/16 2314 16     Temp 01/10/16 2314 97.7 F (36.5 C)     Temp Source 01/10/16 2314 Oral     SpO2 01/10/16 2314 97 %     Weight 01/10/16 2314 160 lb (72.576 kg)     Height 01/10/16 2314 5\' 10"  (1.778 m)     Head Cir --      Peak Flow --      Pain Score 01/10/16 2315 2     Pain Loc --      Pain Edu? --      Excl. in [place]? --     Constitutional: Alert and oriented. Well appearing and in no distress. Eyes: Conjunctivae are normal. PERRL. Normal extraocular movements. ENT   Head: Normocephalic  and atraumatic.   Nose: No congestion/rhinnorhea.   Mouth/Throat: Mucous membranes are moist.   Neck: No stridor. Hematological/Lymphatic/Immunilogical: No cervical lymphadenopathy. Cardiovascular: Normal rate, regular rhythm. Normal and symmetric distal pulses are present in all extremities. No murmurs, rubs, or gallops. Respiratory: Normal respiratory effort without tachypnea nor retractions. Breath sounds are clear and equal bilaterally. No wheezes/rales/rhonchi. Gastrointestinal: Soft and nontender. No distention. There is no CVA tenderness. Genitourinary: deferred Musculoskeletal: Nontender with normal range of motion in all extremities. No joint effusions.  No lower extremity tenderness nor edema. Neurologic:  Normal speech and language. No gross focal neurologic deficits are appreciated. Speech is normal.  Skin:  Quarter size wound posterior left lower leg with surrounding erythema extending up to mid tibia. Psychiatric: Mood and affect are normal. Speech and behavior are normal. Patient exhibits appropriate insight and judgment.  ____________________________________________    LABS (pertinent positives/negatives)  Labs Reviewed  CBC - Abnormal; Notable for the following:    RBC 3.57 (*)    Hemoglobin 11.5 (*)    HCT 34.1 (*)    All other components within normal limits  BASIC METABOLIC PANEL - Abnormal; Notable for the following:    Glucose, Bld 109 (*)    BUN 23 (*)    Creatinine, Ser 1.35 (*)    GFR calc non Af Amer 48 (*)    GFR calc Af Amer 55 (*)    All other components within normal limits       INITIAL IMPRESSION / ASSESSMENT AND PLAN / ED COURSE  Pertinent labs & imaging results that were available during my care of the patient were reviewed by me and considered in my medical decision making (see chart for details). Area of erythema was outlined patient advised to return to the emergency department if any progression of erythema. In addition the  patient received clindamycin in the emergency department will be prescribed same at home.   ____________________________________________   FINAL CLINICAL IMPRESSION(S) / ED DIAGNOSES  Final diagnoses:  Left leg cellulitis      Gregor Hams, MD 01/11/16 [phone]

## 2016-01-11 NOTE — Discharge Instructions (Signed)

## 2016-06-19 ENCOUNTER — Emergency Department
Admission: EM | Admit: 2016-06-19 | Discharge: 2016-06-19 | Disposition: A | Discharge status: Home / Self Care | Payer: Medicare Other | Attending: Emergency Medicine | Admitting: Emergency Medicine

## 2016-06-19 ENCOUNTER — Encounter: Payer: Self-pay | Admitting: Emergency Medicine

## 2016-06-19 ENCOUNTER — Emergency Department: Payer: Medicare Other

## 2016-06-19 DIAGNOSIS — L03115 Cellulitis of right lower limb: Secondary | ICD-10-CM | POA: Diagnosis not present

## 2016-06-19 DIAGNOSIS — E039 Hypothyroidism, unspecified: Secondary | ICD-10-CM | POA: Diagnosis not present

## 2016-06-19 DIAGNOSIS — J45909 Unspecified asthma, uncomplicated: Secondary | ICD-10-CM | POA: Insufficient documentation

## 2016-06-19 DIAGNOSIS — Z79899 Other long term (current) drug therapy: Secondary | ICD-10-CM | POA: Diagnosis not present

## 2016-06-19 DIAGNOSIS — Z85828 Personal history of other malignant neoplasm of skin: Secondary | ICD-10-CM | POA: Insufficient documentation

## 2016-06-19 DIAGNOSIS — J449 Chronic obstructive pulmonary disease, unspecified: Secondary | ICD-10-CM | POA: Diagnosis not present

## 2016-06-19 DIAGNOSIS — Z7951 Long term (current) use of inhaled steroids: Secondary | ICD-10-CM | POA: Diagnosis not present

## 2016-06-19 DIAGNOSIS — R21 Rash and other nonspecific skin eruption: Secondary | ICD-10-CM | POA: Diagnosis present

## 2016-06-19 LAB — URINALYSIS COMPLETE WITH MICROSCOPIC (ARMC ONLY)
BACTERIA UA: NONE SEEN
Bilirubin Urine: NEGATIVE
GLUCOSE, UA: NEGATIVE mg/dL
Hgb urine dipstick: NEGATIVE
Ketones, ur: NEGATIVE mg/dL
Leukocytes, UA: NEGATIVE
NITRITE: NEGATIVE
PROTEIN: NEGATIVE mg/dL
Specific Gravity, Urine: 1.02 (ref 1.005–1.030)
pH: 5 (ref 5.0–8.0)

## 2016-06-19 LAB — BASIC METABOLIC PANEL
ANION GAP: 6 (ref 5–15)
BUN: 21 mg/dL — AB (ref 6–20)
CALCIUM: 9 mg/dL (ref 8.9–10.3)
CO2: 29 mmol/L (ref 22–32)
CREATININE: 1.66 mg/dL — AB (ref 0.61–1.24)
Chloride: 101 mmol/L (ref 101–111)
GFR calc non Af Amer: 37 mL/min — ABNORMAL LOW (ref >60)
GFR, EST AFRICAN AMERICAN: 43 mL/min — AB (ref >60)
Glucose, Bld: 118 mg/dL — ABNORMAL HIGH (ref 65–99)
Potassium: 4.4 mmol/L (ref 3.5–5.1)
Sodium: 136 mmol/L (ref 135–145)

## 2016-06-19 LAB — CBC
HEMATOCRIT: 36.3 % — AB (ref 40.0–52.0)
HEMOGLOBIN: 12.4 g/dL — AB (ref 13.0–18.0)
MCH: 32.9 pg (ref 26.0–34.0)
MCHC: 34.3 g/dL (ref 32.0–36.0)
MCV: 96 fL (ref 80.0–100.0)
Platelets: 216 10*3/uL (ref 150–440)
RBC: 3.78 MIL/uL — AB (ref 4.40–5.90)
RDW: 13.7 % (ref 11.5–14.5)
WBC: 3.5 10*3/uL — AB (ref 3.8–10.6)

## 2016-06-19 MED ORDER — CLINDAMYCIN HCL 150 MG PO CAPS
300.0000 mg | ORAL_CAPSULE | Freq: Once | ORAL | Status: AC
Start: 2016-06-19 — End: 2016-06-19
  Administered 2016-06-19: 300 mg via ORAL
  Filled 2016-06-19: qty 2

## 2016-06-19 MED ORDER — SODIUM CHLORIDE 0.9 % IV BOLUS (SEPSIS)
1000.0000 mL | Freq: Once | INTRAVENOUS | Status: AC
  Administered 2016-06-19: 1000 mL via INTRAVENOUS

## 2016-06-19 MED ORDER — CLINDAMYCIN HCL 300 MG PO CAPS
300.0000 mg | ORAL_CAPSULE | Freq: Four times a day (QID) | ORAL | Status: AC
Start: 2016-06-19 — End: 2016-06-29

## 2016-06-19 NOTE — ED Notes (Signed)
Pt presents to ED with c/o chills, fever, rash/cellulitis on legs. Pt states chills started yesterday with fever. Pt states he took 200mg  tabs x2 tylenol prior to coming to ED.

## 2016-06-19 NOTE — Discharge Instructions (Signed)

## 2016-06-19 NOTE — ED Provider Notes (Signed)
[facility] Emergency Department Provider Note  ____________________________________________  Time seen: Approximately 10:34 PM  I have reviewed the triage vital signs and the nursing notes.   HISTORY  Chief Complaint Fever and Rash   HPI NICOLAUS BYER is a 82 y.o. male history of COPD, BPH, hypothyroidism, postural hypotension presents for evaluation of fever and chills. Patient reports he started to have chills yesterday and had a temperature of 96. Took 2 Tylenol and reports that he felt better. Later this evening started having chills again and took his temperature. He reports that his temperature was just below 101F he took again 2 Tylenol and came in for evaluation. Patient denies headache, neck stiffness, chest pain, shortness of breath, coughing, sore throat, abdominal pain, nausea, vomiting, diarrhea, dysuria, hematuria. He does endorse a pruritic rash that he has had many times in the past in his leg. He has been seen by dermatology who recommended topical steroids. He denies any real history of tick bites.  Past Medical History  Diagnosis Date  . Asthma   . Skin abnormalities   . Skin cancer   . Hypotension   . COPD (chronic obstructive pulmonary disease) (HCC)     not on home oxygen  . Anemia   . BPH (benign prostatic hyperplasia)   . Hypothyroidism     Patient Active Problem List   Diagnosis Date Noted  . COPD exacerbation ([place]) 12/14/2015    Past Surgical History  Procedure Laterality Date  . Mohs surgery    . Tonsillectomy    . Appendectomy    . Bone resection      on nose    Current Outpatient Rx  Name  Route  Sig  Dispense  Refill  . albuterol (PROAIR HFA) 108 (90 Base) MCG/ACT inhaler   Inhalation   Inhale 2 puffs into the lungs every 6 (six) hours as needed for wheezing or shortness of breath.          Marland Kitchen albuterol (PROVENTIL) (2.5 MG/3ML) 0.083% nebulizer solution   Nebulization   Take 2.5 mg by nebulization every 6  (six) hours as needed for wheezing or shortness of breath.         Marland Kitchen azithromycin (ZITHROMAX) 500 MG tablet   Oral   Take 0.5 tablets (250 mg total) by mouth daily. Patient not taking: Reported on 01/11/2016   5 tablet   0   . budesonide-formoterol (SYMBICORT) 80-4.5 MCG/ACT inhaler   Inhalation   Inhale 2 puffs into the lungs 2 (two) times daily.   1 Inhaler   12   . clindamycin (CLEOCIN) 300 MG capsule   Oral   Take 1 capsule (300 mg total) by mouth 3 (three) times daily.   30 capsule   0   . finasteride (PROSCAR) 5 MG tablet   Oral   Take 5 mg by mouth daily.         Marland Kitchen levothyroxine (SYNTHROID, LEVOTHROID) 100 MCG tablet   Oral   Take 1 tablet by mouth daily.         . predniSONE (DELTASONE) 10 MG tablet      Label  & dispense according to the schedule below. 5 Pills PO for 1 day then, 4 Pills PO for 1 day, 3 Pills PO for 1 day, 2 Pills PO for 1 day, 1 Pill PO for 1 days then STOP. Patient not taking: Reported on 01/11/2016   15 tablet   0   . Vitamin D, Ergocalciferol, (  DRISDOL) 50000 units CAPS capsule   Oral   Take 1 capsule by mouth every 7 (seven) days.           Allergies Ivp dye and Sulfa antibiotics  History reviewed. No pertinent family history.  Social History Social History  Substance Use Topics  . Smoking status: Never Smoker   . Smokeless tobacco: Never Used     Comment: tried cigarettes when young- but never really smoked  . Alcohol Use: No    Review of Systems Constitutional: + fever and chill Eyes: Negative for visual changes. ENT: Negative for sore throat. Cardiovascular: Negative for chest pain. Respiratory: Negative for shortness of breath. Gastrointestinal: Negative for abdominal pain, vomiting or diarrhea. Genitourinary: Negative for dysuria. Musculoskeletal: Negative for back pain. Skin: Negative for rash. Neurological: Negative for headaches, weakness or  numbness.  ____________________________________________   PHYSICAL EXAM:  VITAL SIGNS: ED Triage Vitals  Enc Vitals Group     BP 06/19/16 2040 139/75 mmHg     Pulse Rate 06/19/16 2040 95     Resp 06/19/16 2040 16     Temp 06/19/16 2040 98.3 F (36.8 C)     Temp Source 06/19/16 2040 Oral     SpO2 06/19/16 2040 99 %     Weight 06/19/16 2040 159 lb (72.122 kg)     Height 06/19/16 2040 5\' 10"  (1.778 m)     Head Cir --      Peak Flow --      Pain Score 06/19/16 2041 0     Pain Loc --      Pain Edu? --      Excl. in [place]? --     Constitutional: Alert and oriented. Well appearing and in no apparent distress. HEENT:      Head: Normocephalic and atraumatic.         Eyes: Conjunctivae are normal. Sclera is non-icteric. EOMI. PERRL      Mouth/Throat: Mucous membranes are moist.       Neck: Supple with no signs of meningismus. Cardiovascular: Regular rate and rhythm. No murmurs, gallops, or rubs. 2+ symmetrical distal pulses are present in all extremities. No JVD. Respiratory: Normal respiratory effort. Lungs are clear to auscultation bilaterally. No wheezes, crackles, or rhonchi.  Gastrointestinal: Soft, non tender, and non distended with positive bowel sounds. No rebound or guarding. Genitourinary: No suprapubic tenderness. No CVA tenderness. Musculoskeletal: Nontender with normal range of motion in all extremities. No edema, cyanosis, or erythema of extremities. Neurologic: Normal speech and language. Face is symmetric. Moving all extremities. No gross focal neurologic deficits are appreciated. Skin: Skin is warm, dry and intact. No rash noted. There is erythema on b/l LE consistent with chronic venous stasis however the RLE feels warmer than the left. Psychiatric: Mood and affect are normal. Speech and behavior are normal.  ____________________________________________   LABS (all labs ordered are listed, but only abnormal results are displayed)  Labs Reviewed  CBC - Abnormal;  Notable for the following:    WBC 3.5 (*)    RBC 3.78 (*)    Hemoglobin 12.4 (*)    HCT 36.3 (*)    All other components within normal limits  BASIC METABOLIC PANEL - Abnormal; Notable for the following:    Glucose, Bld 118 (*)    BUN 21 (*)    Creatinine, Ser 1.66 (*)    GFR calc non Af Amer 37 (*)    GFR calc Af Amer 43 (*)    All other components  within normal limits  URINE CULTURE  URINALYSIS COMPLETEWITH MICROSCOPIC ([facility] ONLY)   ____________________________________________  EKG  none ____________________________________________  RADIOLOGY  CXR: Negative ____________________________________________   PROCEDURES  Procedure(s) performed: None Critical Care performed:  None ____________________________________________   INITIAL IMPRESSION / ASSESSMENT AND PLAN / ED COURSE  82 y.o. male history of COPD, BPH, hypothyroidism, postural hypotension presents for evaluation of fever and chills since yesterday. No localizing symptoms on history. VS here WNL. Only finding on physical exam is erythema with mild warmth over the RLE possibly early cellulitis.  Since patient has BPH will check UA and culture. Labs showing WBC 3.5. Will check CXR as well.   _________________________ 11:23 PM on 06/19/2016 -----------------------------------------  UA and chest x-ray with no further findings that could explain the fever and chills. Will start patient on clindamycin and discharged home with close follow-up with his doctor tomorrow.  Pertinent labs & imaging results that were available during my care of the patient were reviewed by me and considered in my medical decision making (see chart for details).   I discussed my evaluation of the patient's symptoms, my clinical impression, and my proposed outpatient treatment plan with patient. We have discussed anticipatory guidance, scheduled follow-up, and careful return precautions. The patient expresses understanding and is comfortable  with the discharge plan. All patient's questions were answered.    ____________________________________________   FINAL CLINICAL IMPRESSION(S) / ED DIAGNOSES  Final diagnoses:  None      NEW MEDICATIONS STARTED DURING THIS VISIT:  New Prescriptions   No medications on file     Note:  This document was prepared using Dragon voice recognition software and may include unintentional dictation errors.    Rudene Re, MD 06/19/16 2325

## 2016-06-19 NOTE — ED Notes (Signed)
Pt discharged to home.  Discharge instructions reviewed.  Verbalized understanding.  No questions or concerns at this time.  Teach back verified.  Pt in NAD.  No items left in ED.   

## 2016-06-21 LAB — URINE CULTURE: Culture: <10000 — AB

## 2016-07-30 ENCOUNTER — Inpatient Hospital Stay: Payer: Medicare Other | Admitting: Anesthesiology

## 2016-07-30 ENCOUNTER — Encounter
Admission: EM | Disposition: A | Discharge status: Home Health | Payer: Self-pay | Source: Home / Self Care | Attending: Orthopedic Surgery

## 2016-07-30 ENCOUNTER — Inpatient Hospital Stay
Admission: EM | Admit: 2016-07-30 | Discharge: 2016-08-01 | DRG: 517 | Disposition: A | Discharge status: Home Health | Payer: Medicare Other | Attending: Orthopedic Surgery | Admitting: Orthopedic Surgery

## 2016-07-30 ENCOUNTER — Inpatient Hospital Stay: Payer: Medicare Other

## 2016-07-30 ENCOUNTER — Encounter: Payer: Self-pay | Admitting: Emergency Medicine

## 2016-07-30 ENCOUNTER — Emergency Department: Payer: Medicare Other

## 2016-07-30 DIAGNOSIS — Z419 Encounter for procedure for purposes other than remedying health state, unspecified: Secondary | ICD-10-CM

## 2016-07-30 DIAGNOSIS — I10 Essential (primary) hypertension: Secondary | ICD-10-CM | POA: Diagnosis present

## 2016-07-30 DIAGNOSIS — S82009A Unspecified fracture of unspecified patella, initial encounter for closed fracture: Secondary | ICD-10-CM | POA: Diagnosis present

## 2016-07-30 DIAGNOSIS — S82032A Displaced transverse fracture of left patella, initial encounter for closed fracture: Principal | ICD-10-CM | POA: Diagnosis present

## 2016-07-30 DIAGNOSIS — D649 Anemia, unspecified: Secondary | ICD-10-CM | POA: Diagnosis present

## 2016-07-30 DIAGNOSIS — Z91041 Radiographic dye allergy status: Secondary | ICD-10-CM | POA: Diagnosis not present

## 2016-07-30 DIAGNOSIS — M545 Low back pain, unspecified: Secondary | ICD-10-CM

## 2016-07-30 DIAGNOSIS — Z7951 Long term (current) use of inhaled steroids: Secondary | ICD-10-CM | POA: Diagnosis not present

## 2016-07-30 DIAGNOSIS — Z85828 Personal history of other malignant neoplasm of skin: Secondary | ICD-10-CM | POA: Diagnosis not present

## 2016-07-30 DIAGNOSIS — S82002A Unspecified fracture of left patella, initial encounter for closed fracture: Secondary | ICD-10-CM

## 2016-07-30 DIAGNOSIS — J449 Chronic obstructive pulmonary disease, unspecified: Secondary | ICD-10-CM | POA: Diagnosis present

## 2016-07-30 DIAGNOSIS — W010XXA Fall on same level from slipping, tripping and stumbling without subsequent striking against object, initial encounter: Secondary | ICD-10-CM | POA: Diagnosis present

## 2016-07-30 DIAGNOSIS — N400 Enlarged prostate without lower urinary tract symptoms: Secondary | ICD-10-CM | POA: Diagnosis present

## 2016-07-30 DIAGNOSIS — Z882 Allergy status to sulfonamides status: Secondary | ICD-10-CM

## 2016-07-30 DIAGNOSIS — Z79899 Other long term (current) drug therapy: Secondary | ICD-10-CM | POA: Diagnosis not present

## 2016-07-30 DIAGNOSIS — E039 Hypothyroidism, unspecified: Secondary | ICD-10-CM | POA: Diagnosis present

## 2016-07-30 HISTORY — PX: ORIF PATELLA: SHX5033

## 2016-07-30 LAB — CBC
HCT: 27.7 % — ABNORMAL LOW (ref 40.0–52.0)
HCT: 30.3 % — ABNORMAL LOW (ref 40.0–52.0)
HEMOGLOBIN: 9.4 g/dL — AB (ref 13.0–18.0)
HEMOGLOBIN: 10.4 g/dL — AB (ref 13.0–18.0)
MCH: 33.7 pg (ref 26.0–34.0)
MCH: 34 pg (ref 26.0–34.0)
MCHC: 33.7 g/dL (ref 32.0–36.0)
MCHC: 34.2 g/dL (ref 32.0–36.0)
MCV: 99.9 fL (ref 80.0–100.0)
MCV: 99.6 fL (ref 80.0–100.0)
PLATELETS: 168 10*3/uL (ref 150–440)
PLATELETS: 184 10*3/uL (ref 150–440)
RBC: 2.77 MIL/uL — AB (ref 4.40–5.90)
RBC: 3.04 MIL/uL — ABNORMAL LOW (ref 4.40–5.90)
RDW: 14.3 % (ref 11.5–14.5)
RDW: 14.3 % (ref 11.5–14.5)
WBC: 7 10*3/uL (ref 3.8–10.6)
WBC: 6.6 10*3/uL (ref 3.8–10.6)

## 2016-07-30 LAB — BASIC METABOLIC PANEL
Anion gap: 5 (ref 5–15)
BUN: 19 mg/dL (ref 6–20)
CALCIUM: 8.1 mg/dL — AB (ref 8.9–10.3)
CHLORIDE: 109 mmol/L (ref 101–111)
CO2: 27 mmol/L (ref 22–32)
CREATININE: 0.89 mg/dL (ref 0.61–1.24)
GFR calc Af Amer: >60 mL/min (ref >60)
GFR calc non Af Amer: >60 mL/min (ref >60)
GLUCOSE: 113 mg/dL — AB (ref 65–99)
Potassium: 4.3 mmol/L (ref 3.5–5.1)
Sodium: 141 mmol/L (ref 135–145)

## 2016-07-30 SURGERY — OPEN REDUCTION INTERNAL FIXATION (ORIF) PATELLA
Anesthesia: Spinal | Laterality: Left

## 2016-07-30 MED ORDER — ALBUTEROL SULFATE (2.5 MG/3ML) 0.083% IN NEBU: 2.5000 mg | INHALATION_SOLUTION | Freq: Four times a day (QID) | RESPIRATORY_TRACT | Status: DC | PRN

## 2016-07-30 MED ORDER — BUPIVACAINE HCL (PF) 0.5 % IJ SOLN
INTRAMUSCULAR | Status: DC | PRN
  Administered 2016-07-30: 2.9 mL

## 2016-07-30 MED ORDER — MAGNESIUM HYDROXIDE 400 MG/5ML PO SUSP: 30.0000 mL | Freq: Every day | ORAL | Status: DC | PRN

## 2016-07-30 MED ORDER — HYDROCODONE-ACETAMINOPHEN 5-325 MG PO TABS
1.0000 | ORAL_TABLET | ORAL | Status: DC | PRN
  Administered 2016-07-30 (×2): 1 via ORAL
  Administered 2016-07-31 – 2016-08-01 (×3): 2 via ORAL
  Filled 2016-07-30: qty 2
  Filled 2016-07-30: qty 1
  Filled 2016-07-30: qty 2

## 2016-07-30 MED ORDER — VITAMIN D (ERGOCALCIFEROL) 1.25 MG (50000 UNIT) PO CAPS
50000.0000 [IU] | ORAL_CAPSULE | ORAL | Status: DC
  Administered 2016-07-30: 50000 [IU] via ORAL
  Filled 2016-07-30: qty 1

## 2016-07-30 MED ORDER — IPRATROPIUM-ALBUTEROL 0.5-2.5 (3) MG/3ML IN SOLN
3.0000 mL | Freq: Once | RESPIRATORY_TRACT | Status: AC
  Administered 2016-07-30: 3 mL via RESPIRATORY_TRACT

## 2016-07-30 MED ORDER — MORPHINE SULFATE (PF) 2 MG/ML IV SOLN
0.5000 mg | INTRAVENOUS | Status: DC | PRN
  Administered 2016-07-30: 0.5 mg via INTRAVENOUS
  Filled 2016-07-30: qty 1

## 2016-07-30 MED ORDER — METOCLOPRAMIDE HCL 10 MG PO TABS: 5.0000 mg | ORAL_TABLET | Freq: Three times a day (TID) | ORAL | Status: DC | PRN

## 2016-07-30 MED ORDER — ONDANSETRON HCL 4 MG/2ML IJ SOLN: 4.0000 mg | Freq: Once | INTRAMUSCULAR | Status: DC | PRN

## 2016-07-30 MED ORDER — NEOMYCIN-POLYMYXIN B GU 40-200000 IR SOLN
Status: DC | PRN
  Administered 2016-07-30: 2 mL

## 2016-07-30 MED ORDER — FENTANYL CITRATE (PF) 100 MCG/2ML IJ SOLN: 25.0000 ug | INTRAMUSCULAR | Status: DC | PRN

## 2016-07-30 MED ORDER — DIPHENHYDRAMINE HCL 12.5 MG/5ML PO ELIX: 12.5000 mg | ORAL_SOLUTION | ORAL | Status: DC | PRN

## 2016-07-30 MED ORDER — METHOCARBAMOL 1000 MG/10ML IJ SOLN
500.0000 mg | Freq: Four times a day (QID) | INTRAVENOUS | Status: DC | PRN
  Filled 2016-07-30: qty 5

## 2016-07-30 MED ORDER — ENOXAPARIN SODIUM 40 MG/0.4ML ~~LOC~~ SOLN
40.0000 mg | SUBCUTANEOUS | Status: DC
  Administered 2016-07-31 – 2016-08-01 (×2): 40 mg via SUBCUTANEOUS
  Filled 2016-07-30 (×2): qty 0.4

## 2016-07-30 MED ORDER — EPHEDRINE SULFATE 50 MG/ML IJ SOLN
INTRAMUSCULAR | Status: DC | PRN
  Administered 2016-07-30 (×2): 10 mg via INTRAVENOUS
  Administered 2016-07-30: 5 mg via INTRAVENOUS

## 2016-07-30 MED ORDER — SODIUM CHLORIDE 0.9 % IV SOLN
INTRAVENOUS | Status: DC
  Administered 2016-07-30: 20:00:00 via INTRAVENOUS

## 2016-07-30 MED ORDER — ALBUTEROL SULFATE (2.5 MG/3ML) 0.083% IN NEBU
2.5000 mg | INHALATION_SOLUTION | Freq: Four times a day (QID) | RESPIRATORY_TRACT | Status: DC | PRN
  Administered 2016-08-01: 2.5 mg via RESPIRATORY_TRACT
  Filled 2016-07-30: qty 3

## 2016-07-30 MED ORDER — GLYCOPYRROLATE 0.2 MG/ML IJ SOLN
INTRAMUSCULAR | Status: DC | PRN
  Administered 2016-07-30: 0.2 mg via INTRAVENOUS

## 2016-07-30 MED ORDER — LEVOTHYROXINE SODIUM 50 MCG PO TABS
100.0000 ug | ORAL_TABLET | Freq: Every day | ORAL | Status: DC
  Administered 2016-07-31 – 2016-08-01 (×2): 100 ug via ORAL
  Filled 2016-07-30 (×2): qty 2

## 2016-07-30 MED ORDER — CEFAZOLIN IN D5W 1 GM/50ML IV SOLN
1.0000 g | Freq: Four times a day (QID) | INTRAVENOUS | Status: AC
  Administered 2016-07-30 – 2016-07-31 (×3): 1 g via INTRAVENOUS
  Filled 2016-07-30 (×3): qty 50

## 2016-07-30 MED ORDER — ALBUTEROL SULFATE HFA 108 (90 BASE) MCG/ACT IN AERS: 2.0000 | INHALATION_SPRAY | Freq: Four times a day (QID) | RESPIRATORY_TRACT | Status: DC | PRN

## 2016-07-30 MED ORDER — PROPOFOL 500 MG/50ML IV EMUL
INTRAVENOUS | Status: DC | PRN
  Administered 2016-07-30: 30 ug/kg/min via INTRAVENOUS

## 2016-07-30 MED ORDER — SODIUM CHLORIDE 0.9 % IV SOLN
INTRAVENOUS | Status: DC | PRN
  Administered 2016-07-30: 18:00:00 via INTRAVENOUS

## 2016-07-30 MED ORDER — ONDANSETRON HCL 4 MG PO TABS: 4.0000 mg | ORAL_TABLET | Freq: Four times a day (QID) | ORAL | Status: DC | PRN

## 2016-07-30 MED ORDER — ZOLPIDEM TARTRATE 5 MG PO TABS
5.0000 mg | ORAL_TABLET | Freq: Every evening | ORAL | Status: DC | PRN
  Administered 2016-07-31: 5 mg via ORAL
  Filled 2016-07-30: qty 1

## 2016-07-30 MED ORDER — ONDANSETRON HCL 4 MG/2ML IJ SOLN: 4.0000 mg | Freq: Four times a day (QID) | INTRAMUSCULAR | Status: DC | PRN

## 2016-07-30 MED ORDER — METHOCARBAMOL 500 MG PO TABS
500.0000 mg | ORAL_TABLET | Freq: Four times a day (QID) | ORAL | Status: DC | PRN
  Administered 2016-07-30 – 2016-07-31 (×2): 500 mg via ORAL
  Filled 2016-07-30 (×2): qty 1

## 2016-07-30 MED ORDER — FINASTERIDE 5 MG PO TABS
5.0000 mg | ORAL_TABLET | Freq: Every day | ORAL | Status: DC
  Administered 2016-07-30 – 2016-08-01 (×3): 5 mg via ORAL
  Filled 2016-07-30 (×3): qty 1

## 2016-07-30 MED ORDER — MOMETASONE FURO-FORMOTEROL FUM 100-5 MCG/ACT IN AERO
2.0000 | INHALATION_SPRAY | Freq: Two times a day (BID) | RESPIRATORY_TRACT | Status: DC
  Administered 2016-07-30 – 2016-07-31 (×3): 2 via RESPIRATORY_TRACT
  Filled 2016-07-30: qty 8.8

## 2016-07-30 MED ORDER — MAGNESIUM CITRATE PO SOLN
1.0000 | Freq: Once | ORAL | Status: DC | PRN
  Filled 2016-07-30: qty 296

## 2016-07-30 MED ORDER — METOCLOPRAMIDE HCL 5 MG/ML IJ SOLN: 5.0000 mg | Freq: Three times a day (TID) | INTRAMUSCULAR | Status: DC | PRN

## 2016-07-30 MED ORDER — CEFAZOLIN IN D5W 1 GM/50ML IV SOLN
1.0000 g | Freq: Once | INTRAVENOUS | Status: AC
  Administered 2016-07-30: 1 g via INTRAVENOUS

## 2016-07-30 MED ORDER — ACETAMINOPHEN 325 MG PO TABS: 650.0000 mg | ORAL_TABLET | Freq: Four times a day (QID) | ORAL | Status: DC | PRN

## 2016-07-30 MED ORDER — MORPHINE SULFATE (PF) 2 MG/ML IV SOLN: 2.0000 mg | INTRAVENOUS | Status: DC | PRN

## 2016-07-30 MED ORDER — HYDROCODONE-ACETAMINOPHEN 5-325 MG PO TABS
1.0000 | ORAL_TABLET | Freq: Four times a day (QID) | ORAL | Status: DC | PRN
  Filled 2016-07-30: qty 1
  Filled 2016-07-30: qty 2

## 2016-07-30 MED ORDER — DOCUSATE SODIUM 100 MG PO CAPS
100.0000 mg | ORAL_CAPSULE | Freq: Two times a day (BID) | ORAL | Status: DC
  Administered 2016-07-30 – 2016-08-01 (×4): 100 mg via ORAL
  Filled 2016-07-30 (×4): qty 1

## 2016-07-30 MED ORDER — PHENYLEPHRINE HCL 10 MG/ML IJ SOLN
INTRAMUSCULAR | Status: DC | PRN
  Administered 2016-07-30 (×6): 100 ug via INTRAVENOUS

## 2016-07-30 MED ORDER — ACETAMINOPHEN 650 MG RE SUPP: 650.0000 mg | Freq: Four times a day (QID) | RECTAL | Status: DC | PRN

## 2016-07-30 MED ORDER — FENTANYL CITRATE (PF) 100 MCG/2ML IJ SOLN
INTRAMUSCULAR | Status: DC | PRN
Start: 2016-07-30 — End: 2016-07-30
  Administered 2016-07-30 (×4): 25 ug via INTRAVENOUS

## 2016-07-30 MED ORDER — BISACODYL 10 MG RE SUPP: 10.0000 mg | Freq: Every day | RECTAL | Status: DC | PRN

## 2016-07-30 SURGICAL SUPPLY — 46 items
BANDAGE ELASTIC 6 LF NS (GAUZE/BANDAGES/DRESSINGS) ×3 IMPLANT
CANISTER SUCT 1200ML W/VALVE (MISCELLANEOUS) ×3 IMPLANT
CATH IV ANGIO 16GX3.25 GREY (CATHETERS) IMPLANT
CHLORAPREP W/TINT 26ML (MISCELLANEOUS) ×6 IMPLANT
CUFF TOURN 24 STER (MISCELLANEOUS) ×3 IMPLANT
CUFF TOURN 30 STER DUAL PORT (MISCELLANEOUS) IMPLANT
DRAPE C-ARM XRAY 36X54 (DRAPES) ×3 IMPLANT
DRAPE C-ARMOR (DRAPES) ×3 IMPLANT
DRSG OPSITE POSTOP 3X4 (GAUZE/BANDAGES/DRESSINGS) ×3 IMPLANT
ELECT CAUTERY BLADE 6.4 (BLADE) ×3 IMPLANT
ELECT REM PT RETURN 9FT ADLT (ELECTROSURGICAL) ×3
ELECTRODE REM PT RTRN 9FT ADLT (ELECTROSURGICAL) ×1 IMPLANT
GAUZE PETRO XEROFOAM 1X8 (MISCELLANEOUS) ×3 IMPLANT
GAUZE SPONGE 4X4 12PLY STRL (GAUZE/BANDAGES/DRESSINGS) ×3 IMPLANT
GLOVE BIOGEL PI IND STRL 9 (GLOVE) ×1 IMPLANT
GLOVE BIOGEL PI INDICATOR 9 (GLOVE) ×2
GLOVE SURG ORTHO 9.0 STRL STRW (GLOVE) ×3 IMPLANT
GOWN SRG 2XL LVL 4 RGLN SLV (GOWNS) ×1 IMPLANT
GOWN STRL NON-REIN 2XL LVL4 (GOWNS) ×2
GOWN STRL REUS W/ TWL LRG LVL3 (GOWN DISPOSABLE) ×1 IMPLANT
GOWN STRL REUS W/TWL LRG LVL3 (GOWN DISPOSABLE) ×2
HANDLE YANKAUER SUCT BULB TIP (MISCELLANEOUS) ×3 IMPLANT
HEMOVAC 400CC 10FR (MISCELLANEOUS) IMPLANT
IMMBOLIZER KNEE 19 BLUE UNIV (SOFTGOODS) ×3 IMPLANT
IMMOB KNEE 24 THIGH 24 443303 (SOFTGOODS) IMPLANT
IV CATH ANGIO 16GX3.25 GREY (CATHETERS)
K-WIRE 1.6 (WIRE) ×4
K-WIRE FX5X1.6XNS BN SS (WIRE) ×2
KIT RM TURNOVER STRD PROC AR (KITS) ×3 IMPLANT
KWIRE FX5X1.6XNS BN SS (WIRE) ×2 IMPLANT
NS IRRIG 500ML POUR BTL (IV SOLUTION) ×3 IMPLANT
PACK TOTAL KNEE (MISCELLANEOUS) ×3 IMPLANT
PAD ABD DERMACEA PRESS 5X9 (GAUZE/BANDAGES/DRESSINGS) ×3 IMPLANT
STAPLER SKIN PROX 35W (STAPLE) ×3 IMPLANT
STRAP SAFETY BODY (MISCELLANEOUS) ×3 IMPLANT
SUT FIBERWIRE #5 38 CONV BLUE (SUTURE)
SUT ORTHOCORD W/MULTIPK NDL (SUTURE) IMPLANT
SUT STEEL 7 (SUTURE) IMPLANT
SUT VIC AB 0 CT1 27 (SUTURE)
SUT VIC AB 0 CT1 27XCR 8 STRN (SUTURE) IMPLANT
SUT VIC AB 0 CT1 36 (SUTURE) IMPLANT
SUT VIC AB 1 CT1 36 (SUTURE) ×3 IMPLANT
SUT VIC AB 2-0 CT1 27 (SUTURE) ×2
SUT VIC AB 2-0 CT1 TAPERPNT 27 (SUTURE) ×1 IMPLANT
SUTURE FIBERWR #5 38 CONV BLUE (SUTURE) IMPLANT
SYRINGE 10CC LL (SYRINGE) ×3 IMPLANT

## 2016-07-30 NOTE — Anesthesia Procedure Notes (Signed)
Spinal  Start time: 07/30/2016 5:37 PM End time: 07/30/2016 5:41 PM Staffing Anesthesiologist: Alvin Critchley Resident/CRNA: Aline Brochure Performed: resident/CRNA  Preanesthetic Checklist Completed: patient identified, site marked, surgical consent, pre-op evaluation, IV checked, risks and benefits discussed and monitors and equipment checked Spinal Block Patient position: sitting Prep: Betadine Patient monitoring: heart rate, continuous pulse ox and blood pressure Approach: midline Location: L3-4 Injection technique: single-shot Needle Needle type: Whitacre and Introducer  Needle gauge: 25 G

## 2016-07-30 NOTE — Anesthesia Preprocedure Evaluation (Addendum)
Anesthesia Evaluation  Patient identified by MRN, date of birth, ID band Patient awake    Reviewed: Allergy & Precautions, NPO status , Patient's Chart, lab work & pertinent test results  Airway Mallampati: II  TM Distance: >3 FB     Dental  (+) Edentulous Lower, Edentulous Upper   Pulmonary asthma , COPD,  COPD inhaler,    Pulmonary exam normal        Cardiovascular negative cardio ROS Normal cardiovascular exam     Neuro/Psych negative neurological ROS  negative psych ROS   GI/Hepatic negative GI ROS, Neg liver ROS,   Endo/Other  Hypothyroidism   Renal/GU negative Renal ROS  negative genitourinary   Musculoskeletal negative musculoskeletal ROS (+)   Abdominal Normal abdominal exam  (+)   Peds negative pediatric ROS (+)  Hematology  (+) anemia ,   Anesthesia Other Findings   Reproductive/Obstetrics                            Anesthesia Physical Anesthesia Plan  ASA: III  Anesthesia Plan: Spinal   Post-op Pain Management:    Induction: Intravenous  Airway Management Planned: Nasal Cannula  Additional Equipment:   Intra-op Plan:   Post-operative Plan:   Informed Consent: I have reviewed the patients History and Physical, chart, labs and discussed the procedure including the risks, benefits and alternatives for the proposed anesthesia with the patient or authorized representative who has indicated his/her understanding and acceptance.   Dental advisory given  Plan Discussed with: CRNA and Surgeon  Anesthesia Plan Comments:         Anesthesia Quick Evaluation

## 2016-07-30 NOTE — Transfer of Care (Signed)
Immediate Anesthesia Transfer of Care Note  Patient: Kurt Hill  Procedure(s) Performed: Procedure(s): OPEN REDUCTION INTERNAL (ORIF) FIXATION PATELLA (Left)  Patient Location: PACU  Anesthesia Type:Spinal  Level of Consciousness: awake, alert , oriented and patient cooperative  Airway & Oxygen Therapy: Patient Spontanous Breathing and Patient connected to nasal cannula oxygen  Post-op Assessment: Report given to RN and Post -op Vital signs reviewed and unstable, Anesthesiologist notified  Post vital signs: Reviewed and unstable  Last Vitals:  Vitals:   07/30/16 1717 07/30/16 1900  BP:  (!) 76/41  Pulse: 76   Resp:    Temp:  36.4 C    Last Pain:  Vitals:   07/30/16 1630  TempSrc:   PainSc: 0-No pain         Complications: No apparent anesthesia complications

## 2016-07-30 NOTE — ED Notes (Signed)
Patient transported to X-ray 

## 2016-07-30 NOTE — ED Provider Notes (Signed)
[facility] Emergency Department Provider Note  ____________________________________________   First MD Initiated Contact with Patient 07/30/16 1424     (approximate)  I have reviewed the triage vital signs and the nursing notes.   HISTORY  Chief Complaint Knee Injury and Fall    HPI Kurt Hill is a 82 y.o. male who presents for evaluation of left knee pain after a fall.  He states that he was ambulatory around his former place of employment when he walked through an area that had ammonia fumes or bleach fumes.  He is very sensitive to smells like this is a result of chronic asthma.  He began a coughing fit and was coughing so hard he nearly passed out.  During the course of time that he was coughing he slipped and lost balance and fell,landing with all of his weight on his left knee.  He had acute onset of severe pain but as long as he is still the pain is mild.  He was not able to bend or straighten it initially although now it is mostly straight.  He has severe pain with any flexion or extension.  He has no numbness nor tingling distal to the knee.  He did not completely lose consciousness and denies chest pain, fever/chills, abdominal pain, vomiting.  He had brief shortness of breath during the exposure to the fumes but that has completely resolved.   Past Medical History:  Diagnosis Date  . Anemia   . Asthma   . BPH (benign prostatic hyperplasia)   . COPD (chronic obstructive pulmonary disease) (HCC)    not on home oxygen  . Hypotension   . Hypothyroidism   . Skin abnormalities   . Skin cancer     Patient Active Problem List   Diagnosis Date Noted  . Patella fracture 07/30/2016  . COPD exacerbation ([place]) 12/14/2015    Past Surgical History:  Procedure Laterality Date  . APPENDECTOMY    . BONE RESECTION     on nose  . MOHS SURGERY    . TONSILLECTOMY      Prior to Admission medications   Medication Sig Start Date End Date Taking?  Authorizing Provider  albuterol (PROAIR HFA) 108 (90 Base) MCG/ACT inhaler Inhale 2 puffs into the lungs every 6 (six) hours as needed for wheezing or shortness of breath.  12/06/15 12/05/16  Historical Provider, MD  albuterol (PROVENTIL) (2.5 MG/3ML) 0.083% nebulizer solution Take 2.5 mg by nebulization every 6 (six) hours as needed for wheezing or shortness of breath.    Historical Provider, MD  azithromycin (ZITHROMAX) 500 MG tablet Take 0.5 tablets (250 mg total) by mouth daily. Patient not taking: Reported on 01/11/2016 12/15/15   Henreitta Leber, MD  budesonide-formoterol [facility]) 80-4.5 MCG/ACT inhaler Inhale 2 puffs into the lungs 2 (two) times daily. 12/15/15   Henreitta Leber, MD  finasteride (PROSCAR) 5 MG tablet Take 5 mg by mouth daily.    Historical Provider, MD  levothyroxine (SYNTHROID, LEVOTHROID) 100 MCG tablet Take 1 tablet by mouth daily. 09/04/15   Historical Provider, MD  predniSONE (DELTASONE) 10 MG tablet Label  & dispense according to the schedule below. 5 Pills PO for 1 day then, 4 Pills PO for 1 day, 3 Pills PO for 1 day, 2 Pills PO for 1 day, 1 Pill PO for 1 days then STOP. Patient not taking: Reported on 01/11/2016 12/15/15   Henreitta Leber, MD  Vitamin D, Ergocalciferol, (DRISDOL) 50000 units CAPS capsule  Take 1 capsule by mouth every 7 (seven) days. 09/04/15   Historical Provider, MD    Allergies Ivp dye [iodinated diagnostic agents] and Sulfa antibiotics  History reviewed. No pertinent family history.  Social History Social History  Substance Use Topics  . Smoking status: Never Smoker  . Smokeless tobacco: Never Used     Comment: tried cigarettes when young- but never really smoked  . Alcohol use No    Review of Systems Constitutional: No fever/chills Eyes: No visual changes. ENT: No sore throat. Cardiovascular: Denies chest pain. Respiratory: brief shortness of breath, now resolved Gastrointestinal: No abdominal pain.  No nausea, no vomiting.  No diarrhea.   No constipation. Genitourinary: Negative for dysuria. Musculoskeletal: Negative for back pain.  Severe pain in left knee Skin: Negative for rash. Neurological: Negative for headaches, focal weakness or numbness.  10-point ROS otherwise negative.  ____________________________________________   PHYSICAL EXAM:  VITAL SIGNS: ED Triage Vitals [07/30/16 1354]  Enc Vitals Group     BP 134/60     Pulse Rate 73     Resp 18     Temp 98.7 F (37.1 C)     Temp Source Oral     SpO2 96 %     Weight 160 lb (72.6 kg)     Height 5' 10.5" (1.791 m)     Head Circumference      Peak Flow      Pain Score 1     Pain Loc      Pain Edu?      Excl. in [place]?     Constitutional: Alert and oriented. Elderly, no acute distress. Eyes: Conjunctivae are normal. PERRL. EOMI. Head: Atraumatic. Nose: No congestion/rhinnorhea. Mouth/Throat: Mucous membranes are moist.  Oropharynx non-erythematous. Neck: No stridor.  No meningeal signs.  No cervical spine tenderness to palpation. Cardiovascular: Normal rate, regular rhythm. Good peripheral circulation. Grossly normal heart sounds. Respiratory: Normal respiratory effort.  No retractions. Lungs CTAB. Gastrointestinal: Soft and nontender. No distention.  Musculoskeletal: Swelling and contusion to left knee, severe tenderness with any flexion or extension or palpation.  Otherwise extremities are normal Neurologic:  Normal speech and language. No gross focal neurologic deficits are appreciated.  Skin:  Skin is warm, dry and intact. No rash noted. Psychiatric: Mood and affect are normal. Speech and behavior are normal.  ____________________________________________   LABS (all labs ordered are listed, but only abnormal results are displayed)  Labs Reviewed  BASIC METABOLIC PANEL   ____________________________________________  EKG  Not ordered by ED physician ____________________________________________  RADIOLOGY Ursula Alert, personally viewed  and evaluated these images (plain radiographs) as part of my medical decision making, as well as reviewing the written report by the radiologist.   Dg Knee Complete 4 Views Left  Result Date: 07/30/2016 CLINICAL DATA:  Fall today, left knee pain EXAM: LEFT KNEE - COMPLETE 4+ VIEW COMPARISON:  None. FINDINGS: Four views of the left knee submitted. There is displaced fracture of the left patella with significant separation of bony fragments. There is at least 5.5 cm separation of bony fragments. Moderate joint effusion. IMPRESSION: Displaced fracture of the left patella with significant separation of bony fragments. Moderate joint effusion. Electronically Signed   By: Lahoma Crocker M.D.   On: 07/30/2016 14:29    ____________________________________________   PROCEDURES  Procedure(s) performed:   Procedures   Critical Care performed: No ____________________________________________   INITIAL IMPRESSION / ASSESSMENT AND PLAN / ED COURSE  Pertinent labs & imaging results that were available  during my care of the patient were reviewed by me and considered in my medical decision making (see chart for details).  I will consult Dr. Rudene Christians for evaluation of the patient's severely fractured and displaced patella.  No indication further work up at this time until Dr. Rudene Christians lets me know about the disposition plans.   Clinical Course  Comment By Time  I just spoke by phone with Dr. Rudene Christians, he will come down and evaluate the patient personally or at least check the radiographs and call me back.  He just got out of surgery and is not immediately available but will be shortly. Hinda Kehr, MD 08/22 1510  Dr. Rudene Christians has not yet had the opportunity to come by.  I am ordering some oral pain medicine for the patient as he is starting to hurt.  We will contact Dr. Rudene Christians again soon. Hinda Kehr, MD 08/22 1600  I did not have the opportunity to speak with Dr. Rudene Christians and person, but he has put in admission orders for  the patient. Hinda Kehr, MD 08/22 1646    ____________________________________________  FINAL CLINICAL IMPRESSION(S) / ED DIAGNOSES  Final diagnoses:  Patellar fracture, left, closed, initial encounter     MEDICATIONS GIVEN DURING THIS VISIT:  Medications  HYDROcodone-acetaminophen (NORCO/VICODIN) 5-325 MG per tablet 1-2 tablet (not administered)  morphine 2 MG/ML injection 0.5 mg (not administered)  ceFAZolin (ANCEF) IVPB 1 g/50 mL premix (not administered)     NEW OUTPATIENT MEDICATIONS STARTED DURING THIS VISIT:  New Prescriptions   No medications on file      Note:  This document was prepared using Dragon voice recognition software and may include unintentional dictation errors.    Hinda Kehr, MD 07/30/16 [id number]

## 2016-07-30 NOTE — Progress Notes (Signed)
CSW received consult for possible SNF placement. Pt has not yet been assessed by PT. Pending PT recommendations CSW will complete a full assessment and proceed with SNF referral process. CSW will continue to follow pt and assist as needed.  Georga Kaufmann, MSW, [place]

## 2016-07-30 NOTE — Progress Notes (Signed)
Patient arrived to PACU from ER, awaiting surgery. Dr Kayleen Memos request duoneb treatment preop due to history of COPD

## 2016-07-30 NOTE — H&P (Signed)
Subjective:   Patient is a 82 y.o. male presents with Left knee pain after a fall, he had been coughing so hard that he fell down landing on his knee was unable to bear weight following this. He brought to the emergency room raise on having white markedly displaced patella fracture as being admitted for treatment of this. Onset of symptoms was abrupt starting 2 hours ago with unchanged course since that time. The pain is located anterior knee. Patient describes the pain as sharp at the knee with any motion. 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 Patella fracture 07/30/2016  . COPD exacerbation ([place]) 12/14/2015   Past Medical History:  Diagnosis Date  . Anemia   . Asthma   . BPH (benign prostatic hyperplasia)   . COPD (chronic obstructive pulmonary disease) (HCC)    not on home oxygen  . Hypotension   . Hypothyroidism   . Skin abnormalities   . Skin cancer     Past Surgical History:  Procedure Laterality Date  . APPENDECTOMY    . BONE RESECTION     on nose  . MOHS SURGERY    . TONSILLECTOMY       (Not in a hospital admission) Allergies  Allergen Reactions  . Ivp Dye [Iodinated Diagnostic Agents] Itching and Rash  . Sulfa Antibiotics Rash    Social History  Substance Use Topics  . Smoking status: Never Smoker  . Smokeless tobacco: Never Used     Comment: tried cigarettes when young- but never really smoked  . Alcohol use No    History reviewed. No pertinent family history.  Review of Systems Pertinent items are noted in HPI.  Objective:   Patient Vitals for the past 8 hrs:  BP Temp Temp src Pulse Resp SpO2 Height Weight  07/30/16 1530 (!) 146/71 - - 69 - 96 % - -  07/30/16 1500 (!) 151/75 - - 70 - 100 % - -  07/30/16 1445 - - - 80 - 97 % - -  07/30/16 1430 (!) 143/79 - - 70 - 100 % - -  07/30/16 1400 135/72 - - 72 - 99 % - -  07/30/16 1354 134/60 98.7 F (37.1 C) Oral 73 18 96 % 5'  10.5" (1.791 m) 72.6 kg (160 lb)   No intake/output data recorded. No intake/output data recorded.    BP (!) 146/71   Pulse 69   Temp 98.7 F (37.1 C) (Oral)   Resp 18   Ht 5' 10.5" (1.791 m)   Wt 72.6 kg (160 lb)   SpO2 96%   BMI 22.63 kg/m   General Appearance:    Alert, cooperative, no distress, appears stated age  Head:    Normocephalic, without obvious abnormality, atraumatic  Eyes:    PERRL, conjunctiva/corneas clear,   both eyes          Nose:   Nares normal, septum midline, mucosa normal, no drainage    or sinus tenderness  Throat:   Lips, mucosa, and tongue normal; teeth and gums normal  Neck:   Supple, symmetrical, trachea midline, no adenopathy;       thyroid  Back:     Symmetric, no curvature, ROM normal, no CVA tenderness  Lungs:     Wheezing noted throughout 1 fields, respirations unlabored  Chest wall:    No tenderness or deformity  Heart:    Regular rate and rhythm, S1  and S2 normal, no murmur, rub   or gallop  Abdomen:     Soft, non-tender, bowel sounds active all four quadrants,    no masses, no organomegaly        Extremities:   He has marked effusion present in his left knee with intact skin distally he has skin rash and ulcerations   Pulses:   2+ and symmetric all extremities  Skin:   Skin color, texture, turgor normal, no rashes or lesions, except on both legs with chronic subpoena stasis changes   Lymph nodes:   Cervical, supraclavicular, and axillary nodes normal  Neurologic:   CNII-XII intact. Normal strength, sensation and reflexes      throughout      Data Review Displaced transverse patella fracture on x-rays 2 large fragments Assessment:  Active Problems:   Patella fracture   Plan:   ORIF today as he has not had any beats since breakfast and there are be a long delay tomorrow. Risks benefits possible competitions discussed with patient

## 2016-07-30 NOTE — ED Notes (Signed)
OR tech at bedside for pt transfer to surgery

## 2016-07-30 NOTE — Progress Notes (Signed)
PT AOx4 resting in bed .  VSS.  No acute distress.  Denies pain.  Regional block at T12.

## 2016-07-30 NOTE — ED Triage Notes (Signed)
Pt presents to ED from [facility] [place] [facility] for obvious knee injury related to a fall. Pt states he has asthma and started coughing real hard and needed to hold onto something and fell. Pt states he would have gotten up , but he couldn't straighten his knee. EMS immobilized left knee with brace. Pain 1/10

## 2016-07-30 NOTE — Op Note (Signed)
07/30/2016  7:22 PM  PATIENT:  Kurt Hill  82 y.o. male  PRE-OPERATIVE DIAGNOSIS:  completely displaced transverse patella fracture left  POST-OPERATIVE DIAGNOSIS:  completely displaced transverse patella fracture  PROCEDURE:  Procedure(s): OPEN REDUCTION INTERNAL (ORIF) FIXATION PATELLA (Left)  SURGEON: Laurene Footman, MD  ASSISTANTS: None  ANESTHESIA:   spinal  EBL:  No intake/output data recorded.  BLOOD ADMINISTERED:none  DRAINS: none   LOCAL MEDICATIONS USED:  NONE  SPECIMEN:  No Specimen  DISPOSITION OF SPECIMEN:  N/A  COUNTS:  YES  TOURNIQUET:    IMPLANTS: K wires and cerclage wire  DICTATION: .Dragon Dictation patient brought the operating room and after adequate spinal anesthesia was obtained the left leg was prepped and draped in usual sterile fashion with a tourniquet applied but not required in the upper thigh. After patient identification and timeout procedures were completed of relatively medial parapatellar incision was made through the skin because of an old lateral scar just distal to the knee. The incision was centered over the patella mainly the distal pole as it had not displaced. Large amount of hematoma was evacuated from the joint and the joint irrigated. The periosteum was elevated to close prevent this from falling into the fracture site. There is some comminution of both proximal distal poles of the could be fitted together fairly well and held in place with a reduction clamp. 2 K wires were then inserted and the tips at the distal end were curled around and then a cerclage wire was passed in a figure-of-eight fashion around these. The curved and was then rotated 180 and buried into the tendon and distal patella. The tension band wire was then tightened to appropriate tension and C-arm view showed near anatomic alignment of the fracture. After stable stable repair and placing it through a range of motion passively with no displacement the proximal  ends of the wires were cut short and then bent down into the quadriceps tendon and hopefully prevent them from being too prominent and bothersome the wound was irrigated and then a #1 Vicryl was used to repair the torn capsule medially and laterally 2-0 Vicryl subcutaneously and skin staples Xeroform 4 x 4 web roll Ace wrap and ABDs applied with a knee immobilizer to maintain extension.  PLAN OF CARE: Continue as inpatient  PATIENT DISPOSITION:  PACU - hemodynamically stable.

## 2016-07-31 ENCOUNTER — Inpatient Hospital Stay: Payer: Medicare Other

## 2016-07-31 ENCOUNTER — Encounter: Payer: Self-pay | Admitting: Orthopedic Surgery

## 2016-07-31 NOTE — Evaluation (Signed)
Physical Therapy Evaluation Patient Details Name: Kurt Hill MRN: [id number] DOB: January 25, 1932 Today's Date: 07/31/2016   History of Present Illness  Pt is a 82 yr old male presenting with L knee pain and ROM limitation after sustaining a fall from standing. Imaging revealed L displaced patellar fx and pt is s/p ORIF L patella 8/22. PMH significant for anemia, asthma, COPD, HTN, and skin CA.   Clinical Impression  Prior to admission, pt was independent with all functional mobility and ADLs, leading a very active lifestyle.  Pt lives with his son in a ramped-entry, two story home.  Pt is able to live on the main level and rarely goes upstairs.  Per verbal order from Rachelle Hora, PA-C, lumbar spine imaging reviewed and pt may be activity as tolerated (of note, pt does not c/o any back/SIJ pain during our session).    Currently pt is min guard for all functional mobility including supine > sit, sit <> stand, and ambulation x110ft with RW (all with L KI donned to maintain L knee in extension).  Pt is very impulsive and presents with decreased safety awareness, requiring max cueing for safety, DME use, sequencing, and precautions with minimal carryover demonstrated.  Pt would benefit from skilled PT to address noted impairments and functional limitations.  Recommend pt discharge home with HHPT and 24hr supervision when medically appropriate.     Follow Up Recommendations Home health PT;Supervision/Assistance - 24 hour    Equipment Recommendations  Rolling walker with 5" wheels (Pt believes he has this equipment at home)    Recommendations for Other Services       Precautions / Restrictions Precautions Precautions: Fall Required Braces or Orthoses: Knee Immobilizer - Left Knee Immobilizer - Left: On at all times Restrictions Weight Bearing Restrictions: Yes LLE Weight Bearing: Weight bearing as tolerated (in KI)      Mobility  Bed Mobility Overal bed mobility: Needs Assistance Bed  Mobility: Supine to Sit Supine to sit: Min guard General bed mobility comments: With [facility] elevated ~20deg, use of bed rails, and increased time pt able to assume sitting EOB with min guard for safety. Vc's for maintainence of LLE knee immobilization  Transfers Overall transfer level: Needs assistance Equipment used: Rolling walker (2 wheeled) Transfers: Sit to/from Stand Sit to Stand: Min guard General transfer comment: Heavy cueing for hand/foot placement, DME use, and technique. Requires multiple attempts but ultimately achieves standing without physical assist. Min guard for safety.  Ambulation/Gait Ambulation/Gait assistance: Min guard Ambulation Distance (Feet): 40 Feet Assistive device: Rolling walker (2 wheeled) Gait Pattern/deviations: Step-to pattern;Decreased stance time - left Gait velocity: Decreased General Gait Details: Moderate cueing for DME use and sequencing of gait; pt demonstrating only minimal carryover. Utilizing step-to pattern to maintain L knee extension. Able to hip hike on the L during swing phase in order to advance LLE in KI. Increased time required.    Stairs      Balance Overall balance assessment: Needs assistance Sitting-balance support: Bilateral upper extremity supported (R foot supported) Sitting balance-Leahy Scale: Good Standing balance support: Bilateral upper extremity supported (on RW) Standing balance-Leahy Scale: Fair      Pertinent Vitals/Pain Pain Assessment: 0-10 Pain Score: 1  Pain Location: L Knee Pain Descriptors / Indicators:  ("not exactly painful just letting me know it's there") Pain Intervention(s): Limited activity within patient's tolerance;Monitored during session;Repositioned  HR and O2 monitored throughout session and maintained WFL.     Home Living Family/patient expects to be discharged to:: Private residence  Living Arrangements: Alone Available Help at Discharge: Family;Friend(s);Available PRN/intermittently Type  of Home: House Home Access: Ramped entrance Home Layout: Two level;Able to live on main level with bedroom/bathroom (doesn't go upstairs) Home Equipment: Walker - 4 wheels;Crutches (need to confirm availability of RW)   Prior Function Level of Independence: Independent  Comments: Very active/busy        Extremity/Trunk Assessment   Upper Extremity Assessment: Overall WFL for tasks assessed  Lower Extremity Assessment: Overall WFL for tasks assessed;LLE deficits/detail  LLE deficits/detail: unable to fully assess due to immobilization Light touch sensation intact bilaterally  Cervical / Trunk Assessment: Normal    Communication   Communication: No difficulties  Cognition Arousal/Alertness: Awake/alert Behavior During Therapy: WFL for tasks assessed/performed;Impulsive Overall Cognitive Status: Within Functional Limits for tasks assessed   General Comments General comments (skin integrity, edema, etc.): LLE with ace bandaging and KI in place. KI fit snugly to pt prior to mobilizing.     Exercises General Exercises - Lower Extremity Ankle Circles/Pumps: AROM;Both Quad Sets: AROM;Right Short Arc Quad: AROM;Right Heel Slides: AROM;Right Hip ABduction/ADduction: AROM;Right Straight Leg Raises: AROM;Right  All exercises performed x 10 reps in supine.       Assessment/Plan    PT Assessment Patient needs continued PT services  PT Diagnosis Difficulty walking   PT Problem List Decreased range of motion;Decreased activity tolerance;Decreased balance;Decreased mobility;Decreased safety awareness;Decreased knowledge of use of DME;Decreased knowledge of precautions  PT Treatment Interventions DME instruction;Gait training;Functional mobility training;Therapeutic activities;Therapeutic exercise;Balance training;Patient/family education   PT Goals (Current goals can be found in the Care Plan section) Acute Rehab PT Goals Patient Stated Goal: To go home PT Goal Formulation: With  patient Time For Goal Achievement: 08/14/16 Potential to Achieve Goals: Good    Frequency BID   Barriers to discharge           End of Session Equipment Utilized During Treatment: Gait belt;Left knee immobilizer Activity Tolerance: Patient tolerated treatment well Patient left: in chair;with call bell/phone within reach;with chair alarm set;with family/visitor present Nurse Communication: Mobility status;Precautions;Weight bearing status (via whiteboard)         [id number] PT Time Calculation (min) (ACUTE ONLY): 35 min   Charges:         PT G Codes:        Trini Soldo, SPT 07/31/2016, 1:27 PM

## 2016-07-31 NOTE — Anesthesia Postprocedure Evaluation (Signed)
Anesthesia Post Note  Patient: Kurt Hill  Procedure(s) Performed: Procedure(s) (LRB): OPEN REDUCTION INTERNAL (ORIF) FIXATION PATELLA (Left)  Patient location during evaluation: Nursing Unit Anesthesia Type: Spinal Level of consciousness: awake, awake and alert and oriented Pain management: pain level controlled Vital Signs Assessment: post-procedure vital signs reviewed and stable Respiratory status: spontaneous breathing, nonlabored ventilation and respiratory function stable Cardiovascular status: blood pressure returned to baseline and stable Postop Assessment: no headache and no backache Anesthetic complications: no    Last Vitals:  Vitals:   07/31/16 0236 07/31/16 0410  BP: (!) 116/52 (!) 120/57  Pulse: 66 69  Resp: 18 18  Temp: 36.8 C 36.7 C    Last Pain:  Vitals:   07/31/16 0615  TempSrc:   PainSc: 4                  [employer]

## 2016-07-31 NOTE — Progress Notes (Signed)
   Subjective: 1 Day Post-Op Procedure(s) (LRB): OPEN REDUCTION INTERNAL (ORIF) FIXATION PATELLA (Left) Patient reports pain as mild left knee, moderate right lower back pain. Patient is well, but has had some minor complaints of Right lower back pain, denies any abdominal pain. No numbness tingling or radicular symptoms. Describes the pain as a constant ache, points to the right lumbosacral junction and right SI joint. No right groin or thigh pain. Denies any CP, SOB, ABD pain. We will start therapy today.    Objective: Vital signs in last 24 hours: Temp:  [97 F (36.1 C)-98.7 F (37.1 C)] 98 F (36.7 C) (08/23 0410) Pulse Rate:  [59-80] 69 (08/23 0410) Resp:  [10-18] 18 (08/23 0410) BP: (76-151)/(41-79) 120/57 (08/23 0410) SpO2:  [95 %-100 %] 95 % (08/23 0410) FiO2 (%):  [28 %] 28 % (08/22 2013) Weight:  [72.6 kg (160 lb)] 72.6 kg (160 lb) (08/22 1354)  Intake/Output from previous day: 08/22 0701 - 08/23 0700 In: 1668.8 [I.V.:1568.8; IV Piggyback:100] Out: 1175 [Urine:1075; Blood:100] Intake/Output this shift: Total I/O In: 768.8 [I.V.:668.8; IV Piggyback:100] Out: 605 [Urine:605]   Recent Labs  07/30/16 1922 07/30/16 2018  HGB 9.4* 10.4*    Recent Labs  07/30/16 1922 07/30/16 2018  WBC 7.0 6.6  RBC 2.77* 3.04*  HCT 27.7* 30.3*  PLT 168 184    Recent Labs  07/30/16 2012  NA 141  K 4.3  CL 109  CO2 27  BUN 19  CREATININE 0.89  GLUCOSE 113*  CALCIUM 8.1*   No results for input(s): LABPT, INR in the last 72 hours.  EXAM General - Patient is Alert, Appropriate and Oriented  Abdomen -soft, nontender to palpation. No distention. Extremity - Neurovascular intact Sensation intact distally Intact pulses distally Dorsiflexion/Plantar flexion intact No cellulitis present Compartment soft Negative logroll on the right side. Dressing - dressing C/D/I, no drainage and Knee immobilizer intact left lower extremity. Motor Function - intact, moving foot and  toes well on exam.  Lumbar spine-patient tender to palpation along the right lumbosacral junction and along the right SI joint.  Past Medical History:  Diagnosis Date  . Anemia   . Asthma   . BPH (benign prostatic hyperplasia)   . COPD (chronic obstructive pulmonary disease) (HCC)    not on home oxygen  . Hypotension   . Hypothyroidism   . Skin abnormalities   . Skin cancer     Assessment/Plan:   1 Day Post-Op Procedure(s) (LRB): OPEN REDUCTION INTERNAL (ORIF) FIXATION PATELLA (Left) Active Problems:   Patella fracture  Estimated body mass index is 22.63 kg/m as calculated from the following:   Height as of this encounter: 5' 10.5" (1.791 m).   Weight as of this encounter: 72.6 kg (160 lb). Advance diet Up with therapy , weightbearing as tolerated left lower extremity with knee immobilizer. Pain control. Patient complaining of right lower back pain. No pain with right hip range of motion. We'll order x-ray of the lumbar spine.   DVT Prophylaxis - Lovenox, Foot Pumps and TED hose Weight-Bearing as tolerated to left leg   T. Rachelle Hora, PA-C [place] 07/31/2016, 6:56 AM

## 2016-07-31 NOTE — Progress Notes (Signed)
Foley removed at 0520 with 200cc output of urine.

## 2016-07-31 NOTE — Care Management Note (Addendum)
Case Management Note  Patient Details  Name: Kurt Hill MRN: [id number] Date of Birth: February 25, 1932  Subjective/Objective:       Spoke with patient for discharge planning. Patient is alert and oriented. Patient is from home alone but stated that he will be able to get someone to stay with him. Patient has a walker at home and stated that he will be able to get a [facility] and doesn't want me to order one for him at this time. Son [name] is in the room at this time and will be able to assist patient with picking up meds and requirement. Choice of [place] providers given to patient and he chose to go with [facility]. Referral placed. Pateint will discharge on aspirin per Rachelle Hora PA. Patient lives on 1st  Floor of his home and has a ramp to get in.           Action/Plan: Home with Home Health per PT recommendations. No DME needed at this time  .   Expected Discharge Date:                  Expected Discharge Plan:  [place]  In-House Referral:     Discharge planning Services  CM Consult  Post Acute Care Choice:    Choice offered to:  Patient  DME Arranged:  N/A DME Agency:     HH Arranged:    [place]:  [facility] (now [facility])  Status of Service:  In process, will continue to follow  If discussed at Long Length of Stay Meetings, dates discussed:    Additional Comments:  Alvie Heidelberg, RN 07/31/2016, 2:58 PM

## 2016-07-31 NOTE — Progress Notes (Signed)
Clinical Social Worker (CSW) received SNF consult. PT is recommending home health. RN Case Manager is aware of above. Please reconsult if future social work needs arise. CSW signing off.   Yassmine Tamm, LCSW [phone] 

## 2016-07-31 NOTE — Progress Notes (Signed)
Physical Therapy Treatment Patient Details Name: Kurt Hill MRN: [id number] DOB: 06/22/1932 Today's Date: 07/31/2016    History of Present Illness Pt is a 82 yr old male presenting with L knee pain and ROM limitation after sustaining a fall from standing. Imaging revealed L displaced patellar fx and pt is s/p ORIF L patella 8/22. PMH significant for anemia, asthma, COPD, HTN, and skin CA.     PT Comments    Pt in bed, but agreeable to PT and out of bed again. Pt does report hallucinations post medication earlier, but denies currently. Instructed to report to nursing. Manages to edge of bed without assist. Increased cueing for hand placement and weight shifting to protect left knee from bending force in knee immobilizer especially with sit. Pt ambulates well once cues to improve reciprocal pattern and manage rolling walker further from body. Pt received up in chair comfortably and participates in several exercises. Continue PT to progress strength, endurance to improve all functional mobility and safety.   Follow Up Recommendations  Home health PT;Supervision/Assistance - 24 hour     Equipment Recommendations  Rolling walker with 5" wheels    Recommendations for Other Services       Precautions / Restrictions Precautions Precautions: Fall Required Braces or Orthoses: Knee Immobilizer - Left Knee Immobilizer - Left: On at all times Restrictions Weight Bearing Restrictions: Yes LLE Weight Bearing: Weight bearing as tolerated    Mobility  Bed Mobility Overal bed mobility: Modified Independent Bed Mobility: Supine to Sit     Supine to sit: Modified independent (Device/Increase time)     General bed mobility comments: Self assists LLE over edge of bed  Transfers Overall transfer level: Needs assistance Equipment used: Rolling walker (2 wheeled) Transfers: Sit to/from Stand Sit to Stand: Min guard         General transfer comment: Cues for proper hand placement and  assist transfer of weight toward R to allow for protection of maintaining L knee extension  Ambulation/Gait Ambulation/Gait assistance: Min guard Ambulation Distance (Feet): 200 Feet Assistive device: Rolling walker (2 wheeled) Gait Pattern/deviations: Step-through pattern;Step-to pattern Gait velocity: Decreased Gait velocity interpretation: Below normal speed for age/gender General Gait Details: Initial step through pattern with awkward sequenceing at times and body position too close inside rw. Cues for reciprocal pattern and equal steps with much improved cadence and fluidity. Cues to look up for safe navigaion    Stairs            Wheelchair Mobility    Modified Rankin (Stroke Patients Only)       Balance Overall balance assessment: Needs assistance Sitting-balance support: Bilateral upper extremity supported (R foot supported) Sitting balance-Leahy Scale: Good     Standing balance support: Bilateral upper extremity supported Standing balance-Leahy Scale: Fair                      Cognition Arousal/Alertness: Awake/alert Behavior During Therapy: WFL for tasks assessed/performed;Impulsive Overall Cognitive Status: Within Functional Limits for tasks assessed                      Exercises General Exercises - Lower Extremity Ankle Circles/Pumps: AROM;Both;20 reps Quad Sets: AROM;Right Short Arc Quad: AROM;Right Heel Slides: AROM;Right;20 reps Hip ABduction/ADduction: AAROM;Left;10 reps (AROM R) Straight Leg Raises: AROM;Right;10 reps    General Comments General comments (skin integrity, edema, etc.): LLE with ace bandaging and KI in place. KI fit snugly to pt prior to mobilizing.  Pertinent Vitals/Pain Pain Assessment: 0-10 Pain Score:  (Its OK, now) Pain Location: L knee Pain Descriptors / Indicators:  ("not exactly painful just letting me know it's there") Pain Intervention(s): Monitored during session;Repositioned    Home Living  Family/patient expects to be discharged to:: Private residence Living Arrangements: Alone Available Help at Discharge: Family;Friend(s);Available PRN/intermittently Type of Home: House Home Access: Ramped entrance   Home Layout: Two level;Able to live on main level with bedroom/bathroom (doesn't go upstairs) Home Equipment: Walker - 4 wheels;Crutches      Prior Function Level of Independence: Independent      Comments: Very active/busy   PT Goals (current goals can now be found in the care plan section) Acute Rehab PT Goals Patient Stated Goal: To go home PT Goal Formulation: With patient Time For Goal Achievement: 08/14/16 Potential to Achieve Goals: Good Progress towards PT goals: Progressing toward goals    Frequency  BID    PT Plan Current plan remains appropriate    Co-evaluation             End of Session Equipment Utilized During Treatment: Gait belt;Left knee immobilizer Activity Tolerance: Patient tolerated treatment well Patient left: in chair;with call bell/phone within reach;with chair alarm set;with family/visitor present     Time: [id number] PT Time Calculation (min) (ACUTE ONLY): 28 min  Charges:  $Gait Training: 8-22 mins $Therapeutic Exercise: 8-22 mins                    G Codes:      Charlaine Dalton, PTA 07/31/2016, 2:51 PM

## 2016-08-01 MED ORDER — HYDROCODONE-ACETAMINOPHEN 5-325 MG PO TABS: 1.0000 | ORAL_TABLET | ORAL | 0 refills | Status: DC | PRN

## 2016-08-01 MED ORDER — ASPIRIN EC 325 MG PO TBEC: 325.0000 mg | DELAYED_RELEASE_TABLET | Freq: Every day | ORAL | 0 refills | Status: DC

## 2016-08-01 NOTE — Care Management Important Message (Signed)
Important Message  Patient Details  Name: Kurt Hill MRN: [id number] Date of Birth: June 29, 1932   Medicare Important Message Given:  N/A - LOS <3 / Initial given by admissions    Alvie Heidelberg, RN 08/01/2016, 8:45 AM

## 2016-08-01 NOTE — Progress Notes (Signed)
   Subjective: 2 Days Post-Op Procedure(s) (LRB): OPEN REDUCTION INTERNAL (ORIF) FIXATION PATELLA (Left) Patient reports pain as mild left knee. Patient is well, and has had no acute complaints or problems. Right lower back pain has resolved. Tolerated physical therapy well. Denies any CP, SOB, ABD pain. We will start therapy today.    Objective: Vital signs in last 24 hours: Temp:  [97.4 F (36.3 C)-98.7 F (37.1 C)] 97.4 F (36.3 C) (08/24 0447) Pulse Rate:  [60-74] 67 (08/24 0447) Resp:  [18-20] 18 (08/24 0447) BP: (84-120)/(54-65) 112/64 (08/24 0458) SpO2:  [91 %-98 %] 91 % (08/24 0447)  Intake/Output from previous day: 08/23 0701 - 08/24 0700 In: 600 [P.O.:600] Out: 550 [Urine:550] Intake/Output this shift: No intake/output data recorded.   Recent Labs  07/30/16 1922 07/30/16 2018  HGB 9.4* 10.4*    Recent Labs  07/30/16 1922 07/30/16 2018  WBC 7.0 6.6  RBC 2.77* 3.04*  HCT 27.7* 30.3*  PLT 168 184    Recent Labs  07/30/16 2012  NA 141  K 4.3  CL 109  CO2 27  BUN 19  CREATININE 0.89  GLUCOSE 113*  CALCIUM 8.1*   No results for input(s): LABPT, INR in the last 72 hours.  EXAM General - Patient is Alert, Appropriate and Oriented  Abdomen -soft, nontender to palpation. No distention. Extremity - Neurovascular intact Sensation intact distally Intact pulses distally Dorsiflexion/Plantar flexion intact No cellulitis present Compartment soft Negative logroll on the right side. Dressing - dressing C/D/I, no drainage and Knee immobilizer intact left lower extremity, dressing changed. Motor Function - intact, moving foot and toes well on exam.    Past Medical History:  Diagnosis Date  . Anemia   . Asthma   . BPH (benign prostatic hyperplasia)   . COPD (chronic obstructive pulmonary disease) (HCC)    not on home oxygen  . Hypotension   . Hypothyroidism   . Skin abnormalities   . Skin cancer     Assessment/Plan:   2 Days Post-Op  Procedure(s) (LRB): OPEN REDUCTION INTERNAL (ORIF) FIXATION PATELLA (Left) Active Problems:   Patella fracture  Estimated body mass index is 22.63 kg/m as calculated from the following:   Height as of this encounter: 5' 10.5" (1.791 m).   Weight as of this encounter: 72.6 kg (160 lb). Advance diet Up with therapy , weightbearing as tolerated left lower extremity with knee immobilizer. Discharge home today. Follow-up with [facility] in 12 days for staple removal   DVT Prophylaxis - Lovenox, Foot Pumps and TED hose Weight-Bearing as tolerated to left leg   T. Rachelle Hora, PA-C [place] 08/01/2016, 8:02 AM

## 2016-08-01 NOTE — Discharge Summary (Signed)
Physician Discharge Summary  Patient ID: Kurt Hill MRN: [id number] DOB/AGE: 80-Sep-1933 80 y.o.  Admit date: 07/30/2016 Discharge date: 08/01/2016  Admission Diagnoses:  Surgery, elective [Z41.9] Hypertension [I10] Patellar fracture, left, closed, initial encounter [S82.002A]   Discharge Diagnoses: Patient Active Problem List   Diagnosis Date Noted  . Patella fracture 07/30/2016  . COPD exacerbation ([place]) 12/14/2015    Past Medical History:  Diagnosis Date  . Anemia   . Asthma   . BPH (benign prostatic hyperplasia)   . COPD (chronic obstructive pulmonary disease) (HCC)    not on home oxygen  . Hypotension   . Hypothyroidism   . Skin abnormalities   . Skin cancer      Transfusion: none   Consultants (if any):   Discharged Condition: Improved  Hospital Course: Kurt Hill is an 82 y.o. male who was admitted 07/30/2016 with a diagnosis of Left patella fracture and went to the operating room on 07/30/2016 and underwent the above named procedures.    Surgeries: Procedure(s): OPEN REDUCTION INTERNAL (ORIF) FIXATION PATELLA on 07/30/2016 Patient tolerated the surgery well. Taken to PACU where she was stabilized and then transferred to the orthopedic floor.  Started on Lovenox 40 q 24 hrs. Foot pumps applied bilaterally at 80 mm. Heels elevated on bed with rolled towels. No evidence of DVT. Negative [place]. Physical therapy started on day #1 for gait training and transfer. OT started day #1 for ADL and assisted devices.  Patient's foley was d/c on day #1. Patient's IV and hemovac was d/c on day #2.  On post op day #3 patient was stable and ready for discharge to home.  Implants: K wires and cerclage wire  He was given perioperative antibiotics:  Anti-infectives    Start     Dose/Rate Route Frequency Ordered Stop   07/30/16 2015  ceFAZolin (ANCEF) IVPB 1 g/50 mL premix     1 g 100 mL/hr over 30 Minutes Intravenous Every 6 hours 07/30/16 2012 07/31/16 0906   07/30/16  1945  ceFAZolin (ANCEF) IVPB 1 g/50 mL premix     1 g 100 mL/hr over 30 Minutes Intravenous  Once 07/30/16 1633 07/30/16 1751    .  He was given sequential compression devices, early ambulation, and aspirin for DVT prophylaxis.  He benefited maximally from the hospital stay and there were no complications.    Recent vital signs:  Vitals:   08/01/16 0447 08/01/16 0458  BP: (!) 84/58 112/64  Pulse: 67   Resp: 18   Temp: 97.4 F (36.3 C)     Recent laboratory studies:  Lab Results  Component Value Date   HGB 10.4 (L) 07/30/2016   HGB 9.4 (L) 07/30/2016   HGB 12.4 (L) 06/19/2016   Lab Results  Component Value Date   WBC 6.6 07/30/2016   PLT 184 07/30/2016   Lab Results  Component Value Date   INR 1.1 01/29/2013   Lab Results  Component Value Date   NA 141 07/30/2016   K 4.3 07/30/2016   CL 109 07/30/2016   CO2 27 07/30/2016   BUN 19 07/30/2016   CREATININE 0.89 07/30/2016   GLUCOSE 113 (H) 07/30/2016    Discharge Medications:     Medication List    TAKE these medications   aspirin EC 325 MG tablet Take 1 tablet (325 mg total) by mouth daily.   azithromycin 500 MG tablet Commonly known as:  ZITHROMAX Take 0.5 tablets (250 mg total) by mouth daily.   budesonide-formoterol  80-4.5 MCG/ACT inhaler Commonly known as:  SYMBICORT Inhale 2 puffs into the lungs 2 (two) times daily.   finasteride 5 MG tablet Commonly known as:  PROSCAR Take 5 mg by mouth daily.   HYDROcodone-acetaminophen 5-325 MG tablet Commonly known as:  NORCO/VICODIN Take 1-2 tablets by mouth every 4 (four) hours as needed (breakthrough pain).   levothyroxine 100 MCG tablet Commonly known as:  SYNTHROID, LEVOTHROID Take 1 tablet by mouth daily.   predniSONE 10 MG tablet Commonly known as:  DELTASONE Label  & dispense according to the schedule below. 5 Pills PO for 1 day then, 4 Pills PO for 1 day, 3 Pills PO for 1 day, 2 Pills PO for 1 day, 1 Pill PO for 1 days then STOP.    albuterol (2.5 MG/3ML) 0.083% nebulizer solution Commonly known as:  PROVENTIL Take 2.5 mg by nebulization every 6 (six) hours as needed for wheezing or shortness of breath.   PROAIR HFA 108 (90 Base) MCG/ACT inhaler Generic drug:  albuterol Inhale 2 puffs into the lungs every 6 (six) hours as needed for wheezing or shortness of breath.   Vitamin D (Ergocalciferol) 50000 units Caps capsule Commonly known as:  DRISDOL Take 1 capsule by mouth every 7 (seven) days.       Diagnostic Studies: Dg Lumbar Spine 2-3 Views  Result Date: 07/31/2016 CLINICAL DATA:  Right-sided back pain EXAM: LUMBAR SPINE - 3 VIEW COMPARISON:  12/24/2014 FINDINGS: Five lumbar type vertebral bodies are well visualized. Compression deformity of L1 is noted which has progressed from the prior exam. No new fracture is seen. Mild osteophytic changes are noted. Scattered large and small bowel gas is seen. IMPRESSION: L1 compression deformity which has progressed in the interval from 2016. Electronically Signed   By: Kurt Hill M.D.   On: 07/31/2016 11:22   Dg Knee 1-2 Views Left  Result Date: 07/30/2016 CLINICAL DATA:  Left patella open reduction internal fixation. Initial encounter EXAM: DG C-ARM 61-120 MIN; LEFT KNEE - 1-2 VIEW COMPARISON:  Radiography from yesterday. FINDINGS: Intraoperative fluoroscopy shows reduction and cerclage wire fixation of a patellar fracture. Alignment is significantly improved compared to previous. There is still a fracture gap on the final image, but no offset along the articular surface. IMPRESSION: Fluoroscopy for patellar fracture ORIF. Electronically Signed   By: Kurt Hill M.D.   On: 07/30/2016 18:58   Chest Portable 1 View  Result Date: 07/30/2016 CLINICAL DATA:  Preoperative examination prior patellar surgery; history of asthma -COPD, nonsmoker. EXAM: PORTABLE CHEST 1 VIEW COMPARISON:  PA and lateral chest x-ray of June 19, 2016 FINDINGS: The lungs are well-expanded and clear.  The heart and pulmonary vascularity are normal. The mediastinum is normal in width. There is no pleural effusion. The bony thorax is unremarkable. IMPRESSION: There is no active cardiopulmonary disease. Electronically Signed   By: Kurt  Hill M.D.   On: 07/30/2016 16:49   Dg Knee Complete 4 Views Left  Result Date: 07/30/2016 CLINICAL DATA:  Fall today, left knee pain EXAM: LEFT KNEE - COMPLETE 4+ VIEW COMPARISON:  None. FINDINGS: Four views of the left knee submitted. There is displaced fracture of the left patella with significant separation of bony fragments. There is at least 5.5 cm separation of bony fragments. Moderate joint effusion. IMPRESSION: Displaced fracture of the left patella with significant separation of bony fragments. Moderate joint effusion. Electronically Signed   By: Lahoma Crocker M.D.   On: 07/30/2016 14:29   Dg C-arm 61-120 Min  Result Date: 07/30/2016 CLINICAL DATA:  Left patella open reduction internal fixation. Initial encounter EXAM: DG C-ARM 61-120 MIN; LEFT KNEE - 1-2 VIEW COMPARISON:  Radiography from yesterday. FINDINGS: Intraoperative fluoroscopy shows reduction and cerclage wire fixation of a patellar fracture. Alignment is significantly improved compared to previous. There is still a fracture gap on the final image, but no offset along the articular surface. IMPRESSION: Fluoroscopy for patellar fracture ORIF. Electronically Signed   By: Kurt Hill M.D.   On: 07/30/2016 18:58    Disposition: 01-Home or [place], MD. Schedule an appointment as soon as possible for a visit on 08/13/2016.   Specialty:  Orthopedic Surgery Contact information: [place] [address] [phone]            Signed: [name] 08/01/2016, 8:29 AM

## 2016-08-01 NOTE — Discharge Instructions (Signed)
Diet: As you were doing prior to hospitalization   Shower:  May shower but keep the wounds dry, use an occlusive plastic wrap, NO SOAKING IN TUB.  If the bandage gets wet, change with a clean dry gauze.  Dressing:  You may change your dressing as needed. Change the dressing with sterile gauze dressing.    Activity:  Increase activity slowly as tolerated, but follow the weight bearing instructions below.  No lifting or driving for 6 weeks.  Weight Bearing:   Weight bearing as tolerated to left lower extremity. Must remain in knee immobilizer when weightbearing.  To prevent constipation: you may use a stool softener such as -  Colace (over the counter) 100 mg by mouth twice a day  Drink plenty of fluids (prune juice may be helpful) and high fiber foods Miralax (over the counter) for constipation as needed.    Itching:  If you experience itching with your medications, try taking only a single pain pill, or even half a pain pill at a time.  You may take up to 10 pain pills per day, and you can also use benadryl over the counter for itching or also to help with sleep.   Precautions:  If you experience chest pain or shortness of breath - call 911 immediately for transfer to the hospital emergency department!!  If you develop a fever greater that 101 F, purulent drainage from wound, increased redness or drainage from wound, or calf pain-Call [place]                                              Follow- Up Appointment:  Please call for an appointment to be seen in 2 weeks at [facility]

## 2016-08-01 NOTE — Progress Notes (Signed)
Physical Therapy Treatment Patient Details Name: Kurt Hill MRN: [id number] DOB: 07-03-1932 Today's Date: 08/01/2016    History of Present Illness Pt is a 82 yr old male presenting with L knee pain and ROM limitation after sustaining a fall from standing. Imaging revealed L displaced patellar fx and pt is s/p ORIF L patella 8/22. PMH significant for anemia, asthma, COPD, HTN, and skin CA.     PT Comments    Treatment initially attempted earlier this morn; pt was found to be wheezing on inhalation, cough, and  O2 saturation 89-90% at rest in bed. Pt notes long history of asthma. Nursing notified; pt to received breathing treatment and therapy re attempted later. Second attempt, pt no long wheezing or coughing; agreeable to PT. Pt continues to struggle with proper/safe technique for sit to/from stand keeping left knee straight in immobilizer, and actually attempts to bend within immobilizer. Increased instructed required. Pt ambulates without loss of balance, but does require cues for improved rolling walker position to body; pt does begin to self correct occasionally. Pt noted to have less reciprocal pattern today despite cues, but pt tends to over think pattern/sequence. When pt looks up rather than at leg and converses, pt noted to ambulate with improved step lengths and fluidity. Knee immobilizer readjusted for improved fit. Pt with discharge orders home today.   Follow Up Recommendations  Home health PT;Supervision/Assistance - 24 hour     Equipment Recommendations       Recommendations for Other Services       Precautions / Restrictions Precautions Required Braces or Orthoses: Knee Immobilizer - Left Knee Immobilizer - Left: On at all times Restrictions Weight Bearing Restrictions: Yes LLE Weight Bearing: Weight bearing as tolerated    Mobility  Bed Mobility Overal bed mobility: Modified Independent             General bed mobility comments: Increased  time/effort  Transfers Overall transfer level: Needs assistance Equipment used: Rolling walker (2 wheeled) Transfers: Sit to/from Stand Sit to Stand: Min guard         General transfer comment: Continues to require cues for proper wt shift and hand placement to protect L knee extension; pt attempts to bed L knee within immobilizer   Ambulation/Gait Ambulation/Gait assistance: Min guard Ambulation Distance (Feet): 200 Feet Assistive device: Rolling walker (2 wheeled) Gait Pattern/deviations: Step-through pattern;Decreased stance time - left;Decreased step length - right;Decreased weight shift to left (partial step through) Gait velocity: Decreased Gait velocity interpretation: <1.8 ft/sec, indicative of risk for recurrent falls (1.1 ft/sec) General Gait Details: Pt tends to over thinks steps/sequence requiring cues. Also requires cues for proper rw position in regards to body, but becomes increasinly aware when walker is too far away and often self corrects.   Stairs            Wheelchair Mobility    Modified Rankin (Stroke Patients Only)       Balance                                    Cognition Arousal/Alertness: Awake/alert Behavior During Therapy: WFL for tasks assessed/performed;Impulsive Overall Cognitive Status: Within Functional Limits for tasks assessed                      Exercises      General Comments        Pertinent Vitals/Pain Pain Assessment: No/denies pain  Home Living                      Prior Function            PT Goals (current goals can now be found in the care plan section)      Frequency  BID    PT Plan Current plan remains appropriate    Co-evaluation             End of Session Equipment Utilized During Treatment: Left knee immobilizer (readjusted for improved fit) Activity Tolerance: Patient tolerated treatment well Patient left: in chair;with call bell/phone within reach;with  chair alarm set     Time: [id number] PT Time Calculation (min) (ACUTE ONLY): 27 min  Charges:  $Gait Training: 8-22 mins $Therapeutic Activity: 8-22 mins                    G CodesCharlaine Dalton, PTA 08/01/2016, 12:29 PM

## 2016-08-01 NOTE — Care Management (Addendum)
Patient now says he has no BSC available at home .[facility] unable to deliver [facility] to room this date. Patient will pick DME up at retail store at discharge.  Script given to patient. [facility] notified of discharge.

## 2016-08-05 NOTE — Care Management (Signed)
Returned patient call from Friday. Patient co BSC too small for him to use. Asked patient to call [facility] store and see if they would exchange it. Patient voicemail stated nothing about this only that he had not heard from Physical therapist.  Patient now stated that he was getting PT at home.

## 2016-08-15 ENCOUNTER — Ambulatory Visit: Payer: Medicare Other | Admitting: Anesthesiology

## 2016-08-15 ENCOUNTER — Encounter
Admission: RE | Disposition: A | Discharge status: Skilled Nursing Facility | Payer: Self-pay | Source: Ambulatory Visit | Attending: Orthopedic Surgery

## 2016-08-15 ENCOUNTER — Inpatient Hospital Stay
Admission: RE | Admit: 2016-08-15 | Discharge: 2016-08-18 | DRG: 464 | Disposition: A | Discharge status: Skilled Nursing Facility | Payer: Medicare Other | Source: Ambulatory Visit | Attending: Orthopedic Surgery | Admitting: Orthopedic Surgery

## 2016-08-15 ENCOUNTER — Inpatient Hospital Stay: Payer: Medicare Other

## 2016-08-15 ENCOUNTER — Encounter: Payer: Self-pay | Admitting: *Deleted

## 2016-08-15 ENCOUNTER — Ambulatory Visit: Payer: Medicare Other

## 2016-08-15 DIAGNOSIS — G8918 Other acute postprocedural pain: Secondary | ICD-10-CM | POA: Diagnosis present

## 2016-08-15 DIAGNOSIS — H109 Unspecified conjunctivitis: Secondary | ICD-10-CM | POA: Diagnosis present

## 2016-08-15 DIAGNOSIS — Z79899 Other long term (current) drug therapy: Secondary | ICD-10-CM | POA: Diagnosis not present

## 2016-08-15 DIAGNOSIS — S82032A Displaced transverse fracture of left patella, initial encounter for closed fracture: Secondary | ICD-10-CM | POA: Diagnosis present

## 2016-08-15 DIAGNOSIS — L309 Dermatitis, unspecified: Secondary | ICD-10-CM | POA: Diagnosis present

## 2016-08-15 DIAGNOSIS — Z91041 Radiographic dye allergy status: Secondary | ICD-10-CM

## 2016-08-15 DIAGNOSIS — N400 Enlarged prostate without lower urinary tract symptoms: Secondary | ICD-10-CM | POA: Diagnosis present

## 2016-08-15 DIAGNOSIS — Y831 Surgical operation with implant of artificial internal device as the cause of abnormal reaction of the patient, or of later complication, without mention of misadventure at the time of the procedure: Secondary | ICD-10-CM | POA: Diagnosis present

## 2016-08-15 DIAGNOSIS — Z7952 Long term (current) use of systemic steroids: Secondary | ICD-10-CM | POA: Diagnosis not present

## 2016-08-15 DIAGNOSIS — E039 Hypothyroidism, unspecified: Secondary | ICD-10-CM | POA: Diagnosis present

## 2016-08-15 DIAGNOSIS — Z86010 Personal history of colonic polyps: Secondary | ICD-10-CM | POA: Diagnosis not present

## 2016-08-15 DIAGNOSIS — X58XXXA Exposure to other specified factors, initial encounter: Secondary | ICD-10-CM | POA: Diagnosis present

## 2016-08-15 DIAGNOSIS — J449 Chronic obstructive pulmonary disease, unspecified: Secondary | ICD-10-CM | POA: Diagnosis present

## 2016-08-15 DIAGNOSIS — Z419 Encounter for procedure for purposes other than remedying health state, unspecified: Secondary | ICD-10-CM

## 2016-08-15 DIAGNOSIS — Z85828 Personal history of other malignant neoplasm of skin: Secondary | ICD-10-CM

## 2016-08-15 DIAGNOSIS — T84093A Other mechanical complication of internal left knee prosthesis, initial encounter: Secondary | ICD-10-CM | POA: Diagnosis present

## 2016-08-15 DIAGNOSIS — Z882 Allergy status to sulfonamides status: Secondary | ICD-10-CM | POA: Diagnosis not present

## 2016-08-15 DIAGNOSIS — M810 Age-related osteoporosis without current pathological fracture: Secondary | ICD-10-CM | POA: Diagnosis present

## 2016-08-15 DIAGNOSIS — Z825 Family history of asthma and other chronic lower respiratory diseases: Secondary | ICD-10-CM

## 2016-08-15 DIAGNOSIS — S82002A Unspecified fracture of left patella, initial encounter for closed fracture: Secondary | ICD-10-CM | POA: Diagnosis present

## 2016-08-15 HISTORY — PX: ORIF PATELLA: SHX5033

## 2016-08-15 LAB — CBC
HEMATOCRIT: 27.5 % — AB (ref 40.0–52.0)
HEMOGLOBIN: 9.4 g/dL — AB (ref 13.0–18.0)
MCH: 33.9 pg (ref 26.0–34.0)
MCHC: 34.1 g/dL (ref 32.0–36.0)
MCV: 99.4 fL (ref 80.0–100.0)
PLATELETS: 286 10*3/uL (ref 150–440)
RBC: 2.76 MIL/uL — AB (ref 4.40–5.90)
RDW: 14.5 % (ref 11.5–14.5)
WBC: 5.9 10*3/uL (ref 3.8–10.6)

## 2016-08-15 LAB — CREATININE, SERUM
Creatinine, Ser: 1.11 mg/dL (ref 0.61–1.24)
GFR calc non Af Amer: 60 mL/min — ABNORMAL LOW (ref >60)

## 2016-08-15 SURGERY — OPEN REDUCTION INTERNAL FIXATION (ORIF) PATELLA
Anesthesia: Choice | Site: Knee | Laterality: Left | Wound class: Clean

## 2016-08-15 MED ORDER — MORPHINE SULFATE (PF) 2 MG/ML IV SOLN
2.0000 mg | INTRAVENOUS | Status: DC | PRN
  Administered 2016-08-15 (×2): 2 mg via INTRAVENOUS
  Filled 2016-08-15 (×2): qty 1

## 2016-08-15 MED ORDER — DOCUSATE SODIUM 100 MG PO CAPS
100.0000 mg | ORAL_CAPSULE | Freq: Two times a day (BID) | ORAL | Status: DC
  Administered 2016-08-15 – 2016-08-18 (×7): 100 mg via ORAL
  Filled 2016-08-15 (×7): qty 1

## 2016-08-15 MED ORDER — HYDROCODONE-ACETAMINOPHEN 5-325 MG PO TABS
1.0000 | ORAL_TABLET | ORAL | Status: DC | PRN
  Administered 2016-08-15 (×2): 2 via ORAL
  Administered 2016-08-16: 1 via ORAL
  Administered 2016-08-16: 2 via ORAL
  Administered 2016-08-16 – 2016-08-17 (×2): 1 via ORAL
  Administered 2016-08-17 – 2016-08-18 (×3): 2 via ORAL
  Filled 2016-08-15 (×3): qty 1
  Filled 2016-08-15 (×6): qty 2

## 2016-08-15 MED ORDER — METHOCARBAMOL 1000 MG/10ML IJ SOLN
500.0000 mg | Freq: Four times a day (QID) | INTRAVENOUS | Status: DC | PRN
  Filled 2016-08-15: qty 5

## 2016-08-15 MED ORDER — HYDROMORPHONE HCL 1 MG/ML IJ SOLN
0.5000 mg | Freq: Once | INTRAMUSCULAR | Status: AC
  Administered 2016-08-15: 0.5 mg via INTRAVENOUS
  Filled 2016-08-15: qty 1

## 2016-08-15 MED ORDER — ONDANSETRON HCL 4 MG/2ML IJ SOLN: 4.0000 mg | Freq: Once | INTRAMUSCULAR | Status: DC | PRN

## 2016-08-15 MED ORDER — ACETAMINOPHEN 325 MG PO TABS: 650.0000 mg | ORAL_TABLET | Freq: Four times a day (QID) | ORAL | Status: DC | PRN

## 2016-08-15 MED ORDER — CEFAZOLIN SODIUM-DEXTROSE 2-4 GM/100ML-% IV SOLN
INTRAVENOUS | Status: AC
  Administered 2016-08-16: 2000 mg
  Filled 2016-08-15: qty 100

## 2016-08-15 MED ORDER — FENTANYL CITRATE (PF) 100 MCG/2ML IJ SOLN
INTRAMUSCULAR | Status: AC
  Administered 2016-08-15: 25 ug via INTRAVENOUS
  Filled 2016-08-15: qty 2

## 2016-08-15 MED ORDER — FINASTERIDE 5 MG PO TABS
5.0000 mg | ORAL_TABLET | Freq: Every day | ORAL | Status: DC
  Administered 2016-08-15 – 2016-08-18 (×4): 5 mg via ORAL
  Filled 2016-08-15 (×4): qty 1

## 2016-08-15 MED ORDER — MOMETASONE FURO-FORMOTEROL FUM 100-5 MCG/ACT IN AERO
2.0000 | INHALATION_SPRAY | Freq: Two times a day (BID) | RESPIRATORY_TRACT | Status: DC
  Administered 2016-08-15 – 2016-08-17 (×3): 2 via RESPIRATORY_TRACT
  Filled 2016-08-15: qty 8.8

## 2016-08-15 MED ORDER — ONDANSETRON HCL 4 MG/2ML IJ SOLN
INTRAMUSCULAR | Status: DC | PRN
  Administered 2016-08-15: 4 mg via INTRAVENOUS

## 2016-08-15 MED ORDER — MAGNESIUM HYDROXIDE 400 MG/5ML PO SUSP
30.0000 mL | Freq: Every day | ORAL | Status: DC | PRN
  Administered 2016-08-17: 30 mL via ORAL
  Filled 2016-08-15: qty 30

## 2016-08-15 MED ORDER — LIDOCAINE HCL (CARDIAC) 20 MG/ML IV SOLN
INTRAVENOUS | Status: DC | PRN
  Administered 2016-08-15: 30 mg via INTRAVENOUS

## 2016-08-15 MED ORDER — EPHEDRINE SULFATE 50 MG/ML IJ SOLN
INTRAMUSCULAR | Status: DC | PRN
  Administered 2016-08-15: 10 mg via INTRAVENOUS
  Administered 2016-08-15: 15 mg via INTRAVENOUS
  Administered 2016-08-15: 10 mg via INTRAVENOUS
  Administered 2016-08-15: 15 mg via INTRAVENOUS

## 2016-08-15 MED ORDER — ONDANSETRON HCL 4 MG/2ML IJ SOLN
4.0000 mg | Freq: Four times a day (QID) | INTRAMUSCULAR | Status: DC | PRN
  Administered 2016-08-15: 4 mg via INTRAVENOUS
  Filled 2016-08-15: qty 2

## 2016-08-15 MED ORDER — LACTATED RINGERS IV SOLN
Freq: Once | INTRAVENOUS | Status: AC
  Administered 2016-08-15: 11:00:00 via INTRAVENOUS

## 2016-08-15 MED ORDER — SODIUM CHLORIDE 0.9 % IV SOLN
INTRAVENOUS | Status: DC
  Administered 2016-08-15 – 2016-08-18 (×4): via INTRAVENOUS

## 2016-08-15 MED ORDER — FENTANYL CITRATE (PF) 100 MCG/2ML IJ SOLN
INTRAMUSCULAR | Status: DC | PRN
  Administered 2016-08-15: 100 ug via INTRAVENOUS
  Administered 2016-08-15 (×2): 50 ug via INTRAVENOUS

## 2016-08-15 MED ORDER — METOCLOPRAMIDE HCL 10 MG PO TABS
5.0000 mg | ORAL_TABLET | Freq: Three times a day (TID) | ORAL | Status: DC | PRN
  Administered 2016-08-16: 10 mg via ORAL
  Filled 2016-08-15: qty 1

## 2016-08-15 MED ORDER — MAGNESIUM CITRATE PO SOLN
1.0000 | Freq: Once | ORAL | Status: DC | PRN
  Filled 2016-08-15 (×3): qty 296

## 2016-08-15 MED ORDER — ALBUTEROL SULFATE HFA 108 (90 BASE) MCG/ACT IN AERS: 2.0000 | INHALATION_SPRAY | Freq: Four times a day (QID) | RESPIRATORY_TRACT | Status: DC | PRN

## 2016-08-15 MED ORDER — PROPOFOL 10 MG/ML IV BOLUS
INTRAVENOUS | Status: DC | PRN
  Administered 2016-08-15: 80 mg via INTRAVENOUS

## 2016-08-15 MED ORDER — ENOXAPARIN SODIUM 40 MG/0.4ML ~~LOC~~ SOLN
40.0000 mg | SUBCUTANEOUS | Status: DC
  Administered 2016-08-16 – 2016-08-18 (×3): 40 mg via SUBCUTANEOUS
  Filled 2016-08-15 (×3): qty 0.4

## 2016-08-15 MED ORDER — ONDANSETRON HCL 4 MG PO TABS: 4.0000 mg | ORAL_TABLET | Freq: Four times a day (QID) | ORAL | Status: DC | PRN

## 2016-08-15 MED ORDER — DIPHENHYDRAMINE HCL 12.5 MG/5ML PO ELIX: 12.5000 mg | ORAL_SOLUTION | ORAL | Status: DC | PRN

## 2016-08-15 MED ORDER — BISACODYL 10 MG RE SUPP
10.0000 mg | Freq: Every day | RECTAL | Status: DC | PRN
  Administered 2016-08-18: 10 mg via RECTAL
  Filled 2016-08-15: qty 1

## 2016-08-15 MED ORDER — LEVOTHYROXINE SODIUM 50 MCG PO TABS
100.0000 ug | ORAL_TABLET | ORAL | Status: DC
  Administered 2016-08-15 – 2016-08-18 (×4): 100 ug via ORAL
  Filled 2016-08-15 (×4): qty 2

## 2016-08-15 MED ORDER — VITAMIN D (ERGOCALCIFEROL) 1.25 MG (50000 UNIT) PO CAPS: 50000.0000 [IU] | ORAL_CAPSULE | ORAL | Status: DC

## 2016-08-15 MED ORDER — METHOCARBAMOL 500 MG PO TABS
500.0000 mg | ORAL_TABLET | Freq: Four times a day (QID) | ORAL | Status: DC | PRN
  Administered 2016-08-17 (×2): 500 mg via ORAL
  Filled 2016-08-15 (×4): qty 1

## 2016-08-15 MED ORDER — NEOMYCIN-POLYMYXIN B GU 40-200000 IR SOLN
Status: AC
  Filled 2016-08-15: qty 2

## 2016-08-15 MED ORDER — METOCLOPRAMIDE HCL 5 MG/ML IJ SOLN: 5.0000 mg | Freq: Three times a day (TID) | INTRAMUSCULAR | Status: DC | PRN

## 2016-08-15 MED ORDER — ASPIRIN EC 325 MG PO TBEC
325.0000 mg | DELAYED_RELEASE_TABLET | Freq: Every day | ORAL | Status: DC
  Administered 2016-08-15 – 2016-08-18 (×4): 325 mg via ORAL
  Filled 2016-08-15 (×4): qty 1

## 2016-08-15 MED ORDER — ALBUTEROL SULFATE (2.5 MG/3ML) 0.083% IN NEBU: 2.5000 mg | INHALATION_SOLUTION | Freq: Four times a day (QID) | RESPIRATORY_TRACT | Status: DC | PRN

## 2016-08-15 MED ORDER — MIDAZOLAM HCL 2 MG/2ML IJ SOLN
INTRAMUSCULAR | Status: DC | PRN
  Administered 2016-08-15: 2 mg via INTRAVENOUS

## 2016-08-15 MED ORDER — LACTATED RINGERS IV SOLN
INTRAVENOUS | Status: DC | PRN
  Administered 2016-08-15 (×2): via INTRAVENOUS

## 2016-08-15 MED ORDER — FENTANYL CITRATE (PF) 100 MCG/2ML IJ SOLN
25.0000 ug | INTRAMUSCULAR | Status: DC | PRN
  Administered 2016-08-15 (×4): 25 ug via INTRAVENOUS

## 2016-08-15 MED ORDER — ACETAMINOPHEN 650 MG RE SUPP: 650.0000 mg | Freq: Four times a day (QID) | RECTAL | Status: DC | PRN

## 2016-08-15 MED ORDER — AZITHROMYCIN 250 MG PO TABS: 250.0000 mg | ORAL_TABLET | Freq: Every day | ORAL | Status: DC

## 2016-08-15 MED ORDER — NEOMYCIN-POLYMYXIN B GU 40-200000 IR SOLN
Status: DC | PRN
  Administered 2016-08-15: 2 mL

## 2016-08-15 MED ORDER — CEFAZOLIN SODIUM-DEXTROSE 2-4 GM/100ML-% IV SOLN
2.0000 g | Freq: Once | INTRAVENOUS | Status: AC
  Administered 2016-08-15: 2 g via INTRAVENOUS

## 2016-08-15 MED ORDER — DEXAMETHASONE SODIUM PHOSPHATE 10 MG/ML IJ SOLN
INTRAMUSCULAR | Status: DC | PRN
  Administered 2016-08-15: 5 mg via INTRAVENOUS

## 2016-08-15 MED ORDER — ZOLPIDEM TARTRATE 5 MG PO TABS: 5.0000 mg | ORAL_TABLET | Freq: Every evening | ORAL | Status: DC | PRN

## 2016-08-15 MED ORDER — CEFAZOLIN SODIUM-DEXTROSE 2-4 GM/100ML-% IV SOLN
2.0000 g | Freq: Four times a day (QID) | INTRAVENOUS | Status: AC
  Administered 2016-08-15 – 2016-08-16 (×3): 2 g via INTRAVENOUS
  Filled 2016-08-15 (×4): qty 100

## 2016-08-15 MED ORDER — MORPHINE SULFATE (PF) 2 MG/ML IV SOLN
2.0000 mg | INTRAVENOUS | Status: DC | PRN
  Administered 2016-08-16: 2 mg via INTRAVENOUS
  Filled 2016-08-15: qty 1

## 2016-08-15 SURGICAL SUPPLY — 44 items
BANDAGE ELASTIC 6 LF NS (GAUZE/BANDAGES/DRESSINGS) ×3 IMPLANT
CABLE PIN IMPLANT 35 (Cable) ×6 IMPLANT
CABLE PIN IMPLANT 35MM (Cable) ×3 IMPLANT
CANISTER SUCT 1200ML W/VALVE (MISCELLANEOUS) ×3 IMPLANT
CATH IV ANGIO 16GX3.25 GREY (CATHETERS) ×1 IMPLANT
CHLORAPREP W/TINT 26ML (MISCELLANEOUS) ×6 IMPLANT
CUFF TOURN 24 STER (MISCELLANEOUS) IMPLANT
CUFF TOURN 30 STER DUAL PORT (MISCELLANEOUS) ×3 IMPLANT
DRAPE C-ARM XRAY 36X54 (DRAPES) ×3 IMPLANT
DRAPE C-ARMOR (DRAPES) ×3 IMPLANT
ELECT CAUTERY BLADE 6.4 (BLADE) ×3 IMPLANT
ELECT REM PT RETURN 9FT ADLT (ELECTROSURGICAL) ×3
ELECTRODE REM PT RTRN 9FT ADLT (ELECTROSURGICAL) ×1 IMPLANT
GAUZE PETRO XEROFOAM 1X8 (MISCELLANEOUS) ×3 IMPLANT
GAUZE SPONGE 4X4 12PLY STRL (GAUZE/BANDAGES/DRESSINGS) ×3 IMPLANT
GLOVE BIOGEL PI IND STRL 9 (GLOVE) ×1 IMPLANT
GLOVE BIOGEL PI INDICATOR 9 (GLOVE) ×2
GLOVE SURG ORTHO 9.0 STRL STRW (GLOVE) ×3 IMPLANT
GOWN SRG 2XL LVL 4 RGLN SLV (GOWNS) ×1 IMPLANT
GOWN STRL NON-REIN 2XL LVL4 (GOWNS) ×2
GOWN STRL REUS W/ TWL LRG LVL3 (GOWN DISPOSABLE) ×1 IMPLANT
GOWN STRL REUS W/TWL LRG LVL3 (GOWN DISPOSABLE) ×2
HANDLE YANKAUER SUCT BULB TIP (MISCELLANEOUS) ×3 IMPLANT
HEMOVAC 400CC 10FR (MISCELLANEOUS) ×3 IMPLANT
IMMOB KNEE 24 THIGH 24 443303 (SOFTGOODS) ×3 IMPLANT
IMP SYSTEM BRIDGE 4.75X19.1 (Anchor) ×3 IMPLANT
IMPL SYSTEM BRIDGE 4.75X19.1 (Anchor) ×1 IMPLANT
IV CATH ANGIO 16GX3.25 GREY (CATHETERS) ×3
KIT RM TURNOVER STRD PROC AR (KITS) ×3 IMPLANT
NS IRRIG 500ML POUR BTL (IV SOLUTION) ×3 IMPLANT
PACK TOTAL KNEE (MISCELLANEOUS) ×3 IMPLANT
PAD ABD DERMACEA PRESS 5X9 (GAUZE/BANDAGES/DRESSINGS) ×3 IMPLANT
STAPLER SKIN PROX 35W (STAPLE) ×3 IMPLANT
STRAP SAFETY BODY (MISCELLANEOUS) ×3 IMPLANT
SUT FIBERWIRE #5 38 CONV BLUE (SUTURE) ×6
SUT ORTHOCORD W/MULTIPK NDL (SUTURE) ×3 IMPLANT
SUT STEEL 7 (SUTURE) ×3 IMPLANT
SUT VIC AB 0 CT1 27 (SUTURE) ×2
SUT VIC AB 0 CT1 27XCR 8 STRN (SUTURE) ×1 IMPLANT
SUT VIC AB 0 CT1 36 (SUTURE) ×3 IMPLANT
SUT VIC AB 2-0 CT1 27 (SUTURE) ×2
SUT VIC AB 2-0 CT1 TAPERPNT 27 (SUTURE) ×1 IMPLANT
SUTURE FIBERWR #5 38 CONV BLUE (SUTURE) ×2 IMPLANT
SYRINGE 10CC LL (SYRINGE) ×3 IMPLANT

## 2016-08-15 NOTE — Anesthesia Preprocedure Evaluation (Signed)
Anesthesia Evaluation  Patient identified by MRN, date of birth, ID band Patient awake    Reviewed: Allergy & Precautions, H&P , NPO status , Patient's Chart, lab work & pertinent test results, reviewed documented beta blocker date and time   Airway Mallampati: II  TM Distance: >3 FB Neck ROM: full    Dental no notable dental hx. (+) Teeth Intact   Pulmonary neg pulmonary ROS, asthma , COPD,    Pulmonary exam normal breath sounds clear to auscultation       Cardiovascular Exercise Tolerance: Good hypertension, negative cardio ROS   Rhythm:regular Rate:Normal     Neuro/Psych negative neurological ROS  negative psych ROS   GI/Hepatic negative GI ROS, Neg liver ROS,   Endo/Other  negative endocrine ROSdiabetes  Renal/GU      Musculoskeletal   Abdominal   Peds  Hematology negative hematology ROS (+) anemia ,   Anesthesia Other Findings   Reproductive/Obstetrics negative OB ROS                             Anesthesia Physical Anesthesia Plan  ASA: III  Anesthesia Plan: MAC   Post-op Pain Management:    Induction:   Airway Management Planned:   Additional Equipment:   Intra-op Plan:   Post-operative Plan:   Informed Consent: I have reviewed the patients History and Physical, chart, labs and discussed the procedure including the risks, benefits and alternatives for the proposed anesthesia with the patient or authorized representative who has indicated his/her understanding and acceptance.     Plan Discussed with: CRNA  Anesthesia Plan Comments:         Anesthesia Quick Evaluation

## 2016-08-15 NOTE — NC FL2 (Signed)
[place] LEVEL OF CARE SCREENING TOOL     IDENTIFICATION  Patient Name: Kurt Hill Birthdate: 28-Dec-1931 Sex: male Admission Date (Current Location): 08/15/2016  [place] and [place] Number:  Engineering geologist and Address:  [facility], [address], [place], [place] [address]      Provider Number: [id number]  Attending Physician Name and Address:  Hessie Knows, MD  Relative Name and Phone Number:       Current Level of Care: Hospital Recommended Level of Care: [place] Prior Approval Number:    Date Approved/Denied:   PASRR Number:  ( [id number] )  Discharge Plan: SNF    Current Diagnoses: Patient Active Problem List   Diagnosis Date Noted  . Left patella fracture 08/15/2016  . Patella fracture 07/30/2016  . COPD exacerbation ([place]) 12/14/2015   Asthma without status asthmaticus, unspecified    Hypothyroidism, unspecified    Hypotension    Difficulty walking  ataxia  COPD (chronic obstructive pulmonary disease) , unspecified (CMS-HCC)    Allergic rhinitis    Depression, unspecified    Osteoporosis, post-menopausal  Questionable  Nephrolithiasis    Hx of pleurisy    Cataracts, bilateral    RMSF [facility] spotted fever)  hx of  Cellulitis    Sliding hiatal hernia 01/01/2007 Barium swallow  Varicose veins    SOB (shortness of breath)    Chest pain, unspecified  occasional  Anemia, unspecified    Pernicious anemia    Disorders of bursae and tendons in shoulder region, unspecified       Orientation RESPIRATION BLADDER Height & Weight     Self, Time, Situation, Place  Normal Continent Weight:   Height:     BEHAVIORAL SYMPTOMS/MOOD NEUROLOGICAL BOWEL NUTRITION STATUS   (none )  (none) Continent Diet (Diet: Clear Liquid )  AMBULATORY STATUS COMMUNICATION OF NEEDS Skin   Extensive Assist Verbally Surgical wounds (Incision: Left Knee )                        Personal Care Assistance Level of Assistance  Bathing, Feeding, Dressing Bathing Assistance: Limited assistance Feeding assistance: Independent Dressing Assistance: Limited assistance     Functional Limitations Info  Sight, Hearing, Speech Sight Info: Adequate Hearing Info: Adequate Speech Info: Adequate    SPECIAL CARE FACTORS FREQUENCY  PT (By licensed PT), OT (By licensed OT)     PT Frequency:  (5) OT Frequency:  (5)            Contractures      Additional Factors Info  Code Status, Allergies Code Status Info:  (Full Code. ) Allergies Info:  (Ivp Dye Iodinated Diagnostic Agents, Sulfa Antibiotics)           Current Medications (08/15/2016):  This is the current hospital active medication list Current Facility-Administered Medications  Medication Dose Route Frequency Provider Last Rate Last Dose  . 0.9 %  sodium chloride infusion   Intravenous Continuous Hessie Knows, MD 75 mL/hr at 08/15/16 1531    . acetaminophen (TYLENOL) tablet 650 mg  650 mg Oral Q6H PRN Hessie Knows, MD       Or  . acetaminophen (TYLENOL) suppository 650 mg  650 mg Rectal Q6H PRN Hessie Knows, MD      . albuterol (PROVENTIL) (2.5 MG/3ML) 0.083% nebulizer solution 2.5 mg  2.5 mg Nebulization Q6H PRN Hessie Knows, MD      . aspirin EC tablet 325 mg  325 mg Oral Daily Hessie Knows, MD   325 mg at 08/15/16 1531  . bisacodyl (DULCOLAX) suppository 10 mg  10 mg Rectal Daily PRN Hessie Knows, MD      . ceFAZolin (ANCEF) 2-4 GM/100ML-% IVPB           . ceFAZolin (ANCEF) IVPB 2g/100 mL premix  2 g Intravenous Q6H Hessie Knows, MD      . diphenhydrAMINE (BENADRYL) 12.5 MG/5ML elixir 12.5-25 mg  12.5-25 mg Oral Q4H PRN Hessie Knows, MD      . docusate sodium (COLACE) capsule 100 mg  100 mg Oral BID Hessie Knows, MD   100 mg at 08/15/16 1531  . [START ON 08/16/2016] enoxaparin (LOVENOX) injection 40 mg  40 mg Subcutaneous Q24H Hessie Knows, MD      . finasteride (PROSCAR) tablet 5 mg  5 mg Oral Daily  Hessie Knows, MD   5 mg at 08/15/16 1531  . HYDROcodone-acetaminophen (NORCO/VICODIN) 5-325 MG per tablet 1-2 tablet  1-2 tablet Oral Q4H PRN Hessie Knows, MD   2 tablet at 08/15/16 1624  . levothyroxine (SYNTHROID, LEVOTHROID) tablet 100 mcg  100 mcg Oral [name], MD   100 mcg at 08/15/16 1531  . magnesium citrate solution 1 Bottle  1 Bottle Oral Once PRN Hessie Knows, MD      . magnesium hydroxide (MILK OF MAGNESIA) suspension 30 mL  30 mL Oral Daily PRN Hessie Knows, MD      . methocarbamol (ROBAXIN) tablet 500 mg  500 mg Oral Q6H PRN Hessie Knows, MD       Or  . methocarbamol (ROBAXIN) 500 mg in dextrose 5 % 50 mL IVPB  500 mg Intravenous Q6H PRN Hessie Knows, MD      . metoCLOPramide (REGLAN) tablet 5-10 mg  5-10 mg Oral Q8H PRN Hessie Knows, MD       Or  . metoCLOPramide (REGLAN) injection 5-10 mg  5-10 mg Intravenous Q8H PRN Hessie Knows, MD      . mometasone-formoterol [facility]) 100-5 MCG/ACT inhaler 2 puff  2 puff Inhalation BID Hessie Knows, MD      . morphine 2 MG/ML injection 2 mg  2 mg Intravenous Q2H PRN Hessie Knows, MD   2 mg at 08/15/16 1532  . ondansetron (ZOFRAN) tablet 4 mg  4 mg Oral Q6H PRN Hessie Knows, MD       Or  . ondansetron [facility]) injection 4 mg  4 mg Intravenous Q6H PRN Hessie Knows, MD      . Derrill Memo ON 08/21/2016] Vitamin D (Ergocalciferol) (DRISDOL) capsule 50,000 Units  50,000 Units Oral Q7 days Hessie Knows, MD      . zolpidem [facility]) tablet 5 mg  5 mg Oral QHS PRN Hessie Knows, MD         Discharge Medications: Please see discharge summary for a list of discharge medications.  Relevant Imaging Results:  Relevant Lab Results:   Additional Information  (SSN: [id number])  Kurt Hill, [name], LCSW

## 2016-08-15 NOTE — Transfer of Care (Signed)
Immediate Anesthesia Transfer of Care Note  Patient: Kurt Hill  Procedure(s) Performed: Procedure(s): OPEN REDUCTION INTERNAL (ORIF) FIXATION PATELLA (Left)  Patient Location: PACU  Anesthesia Type:General  Level of Consciousness: awake, alert  and oriented  Airway & Oxygen Therapy: Patient Spontanous Breathing and Patient connected to face mask oxygen  Post-op Assessment: Report given to RN and Post -op Vital signs reviewed and stable  Post vital signs: Reviewed and stable  Last Vitals:  Vitals:   08/15/16 1049 08/15/16 1425  BP: (!) 144/68   Pulse: 66   Resp: 18   Temp: 36.6 C (P) 36.3 C    Last Pain:  Vitals:   08/15/16 1049  TempSrc: Oral  PainSc: 4          Complications: No apparent anesthesia complications

## 2016-08-15 NOTE — H&P (Signed)
Reviewed paper H+P, will be scanned into chart. No changes noted.  

## 2016-08-15 NOTE — Progress Notes (Signed)
Patient back from pre admit testing in wheelchair, Patient very talkative and ready to get in bed.  Patient Covered in dry skin, some scabs present, both eyes Are red and have crusty, sticky drainage present.  Patient States this has been going on for a while now.  Friend Here with him.

## 2016-08-15 NOTE — Anesthesia Procedure Notes (Signed)
Procedure Name: LMA Insertion Performed by: Makyle Eslick Pre-anesthesia Checklist: Patient identified, Patient being monitored, Timeout performed, Emergency Drugs available and Suction available Patient Re-evaluated:Patient Re-evaluated prior to inductionOxygen Delivery Method: Circle system utilized Preoxygenation: Pre-oxygenation with 100% oxygen Intubation Type: IV induction Ventilation: Mask ventilation without difficulty LMA: LMA inserted Tube type: Oral Number of attempts: 1 Placement Confirmation: positive ETCO2 and breath sounds checked- equal and bilateral Tube secured with: Tape Dental Injury: Teeth and Oropharynx as per pre-operative assessment        

## 2016-08-15 NOTE — Op Note (Signed)
08/15/2016  2:30 PM  PATIENT:  Kurt Hill  82 y.o. male  PRE-OPERATIVE DIAGNOSIS:  CLOSED DISPLACED TRANSVERSE FRACTURE OF LEFT PATELLA,S/P ORIF FRACTURE with failure of fixation  POST-OPERATIVE DIAGNOSIS:  CLOSED DISPLACED TRANSVERSE FRACTURE OF LEFT PATELLA,S/P ORIF FRACTURE with failure fixation  PROCEDURE:  Procedure(s): OPEN REDUCTION INTERNAL (ORIF) FIXATION PATELLA (Left) removal of prior hardware  SURGEON: Laurene Footman, MD  ASSISTANTS: None  ANESTHESIA:   general  EBL:  Total I/O In: 1100 [I.V.:1100] Out: 200 [Blood:200]  BLOOD ADMINISTERED:none  DRAINS: none   LOCAL MEDICATIONS USED:  NONE  SPECIMEN:  No Specimen  DISPOSITION OF SPECIMEN:  N/A  COUNTS:  YES  TOURNIQUET:    IMPLANTS: Zimmer patella clamp system and speed bridge from [employer]  DICTATION: .Dragon Dictation patient brought the operating room and after adequate anesthesia was obtained the left leg was prepped and draped in sterile fashion. After patient identification and timeout procedures were completed the prior incision was opened and extended proximally and distally. The hardware had pulled out of the distal fragment and the distal fragment was quite comminuted. After irrigation of the joint removal of clots the prior hardware was removed without difficulty. Clamp was used to try to get adequate reduction but with the combination of the distal pole this was difficult. 2 the cable pin systems from [employer]immer were used to pass through from distal to proximal one of the wires unfortunately became unweighted and that had to be cut and a second wire placed parallel to it so 3 of the screws were placed but only 2 wires remaining this was then passed in a figure-of-eight fashion through a drill hole proximally and this gave near anatomic alignment despite the comminution. Because of the extensive comminution distal radius. Bridge anchor was placed with internal brace type system from [employer] with 2 anchors  placed in the proximal pole of patella and then 2 anchors distally in the tibia holes were drilled anchors were placed on the tape was used to aid in the repair and essentially augment the repair of the patella the wound was copiously irrigated the end of the case with acceptable alignment the wound was closed with #1 Vicryl to repair the capsule 2-0 Vicryl subcutaneously and skin staples Xeroform 4 x 4's ABDs web roll and Ace wrap were applied along with a hinged knee brace locked in extension`  PLAN OF CARE: Admit to inpatient   PATIENT DISPOSITION:  PACU - hemodynamically stable.

## 2016-08-15 NOTE — Progress Notes (Signed)
Dr Rudene Christians in to see patient.  Removed knee immobilizer From left leg and removed steri-strips per Dr. Rudene Christians and Wiped down with CHG wipes.

## 2016-08-16 MED ORDER — SODIUM CHLORIDE 0.9 % IV BOLUS (SEPSIS)
1000.0000 mL | Freq: Once | INTRAVENOUS | Status: AC
Start: 2016-08-16 — End: 2016-08-16
  Administered 2016-08-16: 1000 mL via INTRAVENOUS

## 2016-08-16 MED ORDER — CIPROFLOXACIN HCL 0.3 % OP SOLN
2.0000 [drp] | OPHTHALMIC | Status: AC
  Administered 2016-08-16 (×2): 2 [drp] via OPHTHALMIC
  Filled 2016-08-16: qty 2.5

## 2016-08-16 MED ORDER — CIPROFLOXACIN HCL 0.3 % OP SOLN
2.0000 [drp] | OPHTHALMIC | Status: DC
  Administered 2016-08-16 – 2016-08-18 (×4): 2 [drp] via OPHTHALMIC
  Filled 2016-08-16: qty 2.5

## 2016-08-16 NOTE — Care Management Important Message (Signed)
Important Message  Patient Details  Name: Kurt Hill MRN: [id number] Date of Birth: September 20, 1932   Medicare Important Message Given:  Yes    Katrina Stack, RN 08/16/2016, 2:24 PM

## 2016-08-16 NOTE — Care Management (Signed)
Patient had left patellar fracture ORIF 07/30/2016.  Required another ORIF due to hardware failure 9/7. Patient discharged home on previous procedure with [facility].  Confirmed with attending that he did indeed discharge home on aspirin rather than Lovenox.  Physical therapy is recommending skilled nuring placement.  Patient "is not sure about that."  Will keep this option open and [facility] is following for home health

## 2016-08-16 NOTE — Clinical Social Work Note (Signed)
Clinical Social Work Assessment  Patient Details  Name: Kurt Hill MRN: [id number] Date of Birth: 12/25/1931  Date of referral:  08/16/16               Reason for consult:  Facility Placement                Permission sought to share information with:  Chartered certified accountant granted to share information::  Yes, Verbal Permission Granted  Name::      Kurt Hill::   [place]   Relationship::     Contact Information:     Housing/Transportation Living arrangements for the past 2 months:  [place] of Information:  Patient Patient Interpreter Needed:  None Criminal Activity/Legal Involvement Pertinent to Current Situation/Hospitalization:  No - Comment as needed Significant Relationships:  Adult Children, Other Family Members, Friend Lives with:  Self Do you feel safe going back to the place where you live?  Yes Need for family participation in patient care:  No (Coment)  Care giving concerns:  Patient lives in [place] with his son.    Social Worker assessment / plan:  Holiday representative (CSW) received SNF consult. PT is recommending SNF. CSW met with patient alone at bedside to discuss D/C plan. CSW introduced self and explained role of CSW department. Patient was alert and oriented and was sitting up in the chair. Patient reported that he lives in [place] and his son lives with him however he works during the day. CSW discussed SNF process. Patient is agreeable to SNF search in [facility]. CSW presented bed offers to patient. [facility] chose [name] liaison is aware of accepted bed offer. Plan is for patient to D/C to [facility] on Sunday 08/18/16 when stable. Per Kurt Hill patient will go to room 706. RN will call report to [address]. CSW will continue to follow and assist as needed.     Employment status:  Retired Forensic scientist:  Medicare PT Recommendations:  [place]  / Referral to community resources:  [place]  Patient/Family's Response to care:  Patient is agreeable to going to [facility] when stable.   Patient/Family's Understanding of and Emotional Response to Diagnosis, Current Treatment, and Prognosis:  Patient was very pleasant and thanked CSW for visit.   Emotional Assessment Appearance:  Appears stated age Attitude/Demeanor/Rapport:    Affect (typically observed):  Accepting, Adaptable, Pleasant Orientation:  Oriented to Self, Oriented to Place, Oriented to  Time, Oriented to Situation Alcohol / Substance use:  Not Applicable Psych involvement (Current and /or in the community):  No (Comment)  Discharge Needs  Concerns to be addressed:  Discharge Planning Concerns Readmission within the last 30 days:  No Current discharge risk:  Dependent with Mobility Barriers to Discharge:  Continued Medical Work up   [employer], [name], LCSW 08/16/2016, 12:02 PM

## 2016-08-16 NOTE — Progress Notes (Signed)
Patient still unable to void. Patient states that he does have prostate problems. MD called and notified. Fluid bolus ordered will continue to monitor.

## 2016-08-16 NOTE — Clinical Social Work Placement (Signed)
   CLINICAL SOCIAL WORK PLACEMENT  NOTE  Date:  08/16/2016  Patient Details  Name: Kurt Hill MRN: [id number] Date of Birth: 02/07/1932  Clinical Social Work is seeking post-discharge placement for this patient at the [place] level of care (*CSW will initial, date and re-position this form in  chart as items are completed):  Yes   Patient/family provided with [place] Work Department's list of facilities offering this level of care within the geographic area requested by the patient (or if unable, by the patient's family).  Yes   Patient/family informed of their freedom to choose among providers that offer the needed level of care, that participate in Medicare, Medicaid or managed care program needed by the patient, have an available bed and are willing to accept the patient.  Yes   Patient/family informed of [facility]'s ownership interest in [facility] and [facility], as well as of the fact that they are under no obligation to receive care at these facilities.  PASRR submitted to EDS on       PASRR number received on       Existing PASRR number confirmed on 08/16/16     FL2 transmitted to all facilities in geographic area requested by pt/family on 08/16/16     FL2 transmitted to all facilities within larger geographic area on       Patient informed that his/her managed care company has contracts with or will negotiate with certain facilities, including the following:        Yes   Patient/family informed of bed offers received.  Patient chooses bed at  ([facility] )     Physician recommends and patient chooses bed at      Patient to be transferred to   on  .  Patient to be transferred to facility by       Patient family notified on   of transfer.  Name of family member notified:        PHYSICIAN       Additional Comment:    _______________________________________________ Romy Ipock, [name], LCSW 08/16/2016, 12:01 PM

## 2016-08-16 NOTE — Progress Notes (Signed)
Shift assessment was completed at 0740, see flowsheet. Pt awake, alert and oriented x4, acknowledged pain. Pt is on room air. Abrasion noted to R side of pt's forehead, is scabbed and dry. Pt has dried secretions to his bilat eyes, pt pointed this out to the Dr. On rounds, and has had cipro eye drops x2 thus far. Pt stated he had been treated for eye infections several times as an outpt, but stated it keeps coming back. Pt has L leg immobilizer in place, ace wrap is noted under this. Per Dr. Rudene Christians on rounds, bandage to be changed on Sunday prior to pt d/c to rehab. PIV#20 intact to L fa with iv NS infusing at 13mls/hr, site is free of redness and swelling. Pt is able to move toes on his L foot, cap refill at <3 seconds, foot is warm. Rppp, Difficult to palpate l pp. Pt told this writer that this was the second operation on his L knee in last 3 weeks, as first operation deteriorated at home and needed to be redone. Pt has been assisted oob to chair with PT and returned to bed again. Pt is tolerating a regular diet. Call bell in reach.

## 2016-08-16 NOTE — Progress Notes (Signed)
Pt resting in bed, requested and received med for pain 8/10 to L leg. IVF continues with site free of redness and swelling. Pt stated he is having no difficulty voiding.

## 2016-08-16 NOTE — Plan of Care (Signed)
Problem: Tissue Perfusion: Goal: Risk factors for ineffective tissue perfusion will decrease Outcome: Progressing Pt has remained free of falls/injury this shift, worked with physical therapy. Pt and Dr. Rudene Christians discussed pt's plan of care this am when rounds were completed, pt is in agreement with doctor, anticipated d/c to rehab on 9/10. Pt remains on IVF, stated he is now having no difficulty voiding. Knee immobilizer in place.

## 2016-08-16 NOTE — Progress Notes (Signed)
Assisted patient to bedside commode to void. Patient dis dribble some but nothing measurable. Patient states he does have prostate problems. Will continue to monitor.

## 2016-08-16 NOTE — Progress Notes (Signed)
   Subjective: 1 Day Post-Op Procedure(s) (LRB): OPEN REDUCTION INTERNAL (ORIF) FIXATION PATELLA (Left) Patient reports pain as mild.   Patient is well, but has had some minor complaints of Left greater than right eye drainage, been present for the last 1-2 days. Patient has no eye pain or vision changes but continued drainage and matting of the eyes upon awakening. No photophobia, headache. No sensation of foreign body. Denies any CP, SOB, ABD pain. We will continue therapy today.  Plan is to go Skilled nursing facility after hospital stay.  Objective: Vital signs in last 24 hours: Temp:  [97.3 F (36.3 C)-98 F (36.7 C)] 97.4 F (36.3 C) (09/08 0432) Pulse Rate:  [63-100] 81 (09/08 0432) Resp:  [9-18] 16 (09/08 0432) BP: (95-144)/(44-68) 116/62 (09/08 0432) SpO2:  [93 %-100 %] 100 % (09/08 0432) FiO2 (%):  [8 %] 8 % (09/07 1425)  Intake/Output from previous day: 09/07 0701 - 09/08 0700 In: 1200 [I.V.:1200] Out: 600 [Urine:400; Blood:200] Intake/Output this shift: No intake/output data recorded.   Recent Labs  08/15/16 1552  HGB 9.4*    Recent Labs  08/15/16 1552  WBC 5.9  RBC 2.76*  HCT 27.5*  PLT 286    Recent Labs  08/15/16 1552  CREATININE 1.11   No results for input(s): LABPT, INR in the last 72 hours.  EXAM General - Patient is Alert, Appropriate and Oriented  HEENT-both eyes with minimal erythema along the conjunctiva, bilateral eye drainage clear to yellowish with matting of the eyelids. Pupils are equal round and reactive. No pain with extraocular eye movement. No soft tissue swelling warmth or erythema of the upper or lower lids.   Extremity - Neurovascular intact Sensation intact distally Intact pulses distally Dorsiflexion/Plantar flexion intact No cellulitis present Dressing - dressing C/D/I and no drainage knee immobilizer intact, locked in extension..  Motor Function - intact, moving foot and toes well on exam.   Past Medical History:   Diagnosis Date  . Anemia   . Asthma   . BPH (benign prostatic hyperplasia)   . COPD (chronic obstructive pulmonary disease) (HCC)    not on home oxygen  . Hypotension   . Hypothyroidism   . Skin abnormalities   . Skin cancer     Assessment/Plan:   1 Day Post-Op Procedure(s) (LRB): OPEN REDUCTION INTERNAL (ORIF) FIXATION PATELLA (Left) Active Problems:   Left patella fracture   Bilateral eye conjunctivitis   Estimated body mass index is 22.63 kg/m as calculated from the following:   Height as of 07/30/16: 5' 10.5" (1.791 m).   Weight as of 07/30/16: 72.6 kg (160 lb). Advance diet Up with therapy  Continue with knee immobilizer, locked in extension at all times. Patient is weightbearing as tolerated. He will follow-up in 2 weeks with [facility] orthopedist for staple removal, x-rays. Start ciprofloxacin eyedrops for conjunctivitis. 2 drops every 2 hours while awake both eyes for 2 days, then 2 drops every 4 hours while awake for 4 days   DVT Prophylaxis - Aspirin, Foot Pumps and TED hose Weight-Bearing as tolerated to left leg   T. Rachelle Hora, PA-C [place] 08/16/2016, 8:26 AM

## 2016-08-16 NOTE — Progress Notes (Signed)
Physical Therapy Treatment Patient Details Name: Kurt Hill MRN: [id number] DOB: [birth year] Today's Date: 08/16/2016    History of Present Illness Pt is a 82 yr old male presenting 2 weeks ago with L knee pain and ROM limitation after a fall. Imaging revealed L displaced patellar fx, had ORIF L patella 8/22, he had started recovering with PT at home but needed to return needing another sx for patella hardwear removal and replacement. PMH significant for anemia, asthma, COPD, HTN, and skin CA.     PT Comments    Pt shows good effort though again he is anxious, at times impulsive and needs significant (nearly constant) cuing and re-direction to perform requested acts properly.  He shows some improved mobility and ambulation but ultimately is self limiting with hesitation and needing constant re-affirmation and re-direction  Follow Up Recommendations  SNF     Equipment Recommendations       Recommendations for Other Services       Precautions / Restrictions Precautions Precautions: Fall Required Braces or Orthoses: Knee Immobilizer - Left Knee Immobilizer - Left: On at all times Restrictions LLE Weight Bearing: Weight bearing as tolerated    Mobility  Bed Mobility Overal bed mobility: Needs Assistance Bed Mobility: Sit to Supine       Sit to supine: Mod assist;Min assist   General bed mobility comments: Pt needing a lot of cuing and needed direct assist to lift/place L LE  Transfers Overall transfer level: Needs assistance Equipment used: Rolling walker (2 wheeled) Transfers: Sit to/from Stand Sit to Stand: Min assist         General transfer comment: Pt again very anxious, needing a lot of cuing/encouragement but able to rise with light assist  Ambulation/Gait Ambulation/Gait assistance: Min assist;Mod assist Ambulation Distance (Feet): 20 Feet Assistive device: Rolling walker (2 wheeled)       General Gait Details: Pt again very slow and labored with the  effort.  He was able to advance L LE w/o direct PT assist on a vast majority of steps this afternoon but he is still slow and hesitant and needing nearly constant cuing and re-affirmations t/o the effort.    Stairs            Wheelchair Mobility    Modified Rankin (Stroke Patients Only)       Balance                                    Cognition Arousal/Alertness: Awake/alert Behavior During Therapy: Impulsive;Restless Overall Cognitive Status: Within Functional Limits for tasks assessed                      Exercises General Exercises - Lower Extremity Ankle Circles/Pumps: 20 reps;Strengthening Quad Sets: 10 reps;Strengthening Gluteal Sets: Strengthening;10 reps Hip ABduction/ADduction: Strengthening;10 reps Straight Leg Raises: AAROM;AROM;10 reps    General Comments        Pertinent Vitals/Pain Pain Score: 4     Home Living                      Prior Function            PT Goals (current goals can now be found in the care plan section) Progress towards PT goals: Progressing toward goals    Frequency  BID    PT Plan Current plan remains appropriate    Co-evaluation  End of Session Equipment Utilized During Treatment: Left knee immobilizer;Gait belt Activity Tolerance: Patient tolerated treatment well Patient left: with call bell/phone within reach;with bed alarm set     Time: 1333-1400 PT Time Calculation (min) (ACUTE ONLY): 27 min  Charges:  $Gait Training: 8-22 mins $Therapeutic Exercise: 8-22 mins                    G Codes:      Kreg Shropshire, DPT 08/16/2016, 3:43 PM

## 2016-08-16 NOTE — Evaluation (Signed)
Physical Therapy Evaluation Patient Details Name: Kurt Hill MRN: [id number] DOB: 02-15-1932 Today's Date: 08/16/2016   History of Present Illness  Pt is a 82 yr old male presenting with L knee pain and ROM limitation after sustaining a fall from standing. Imaging revealed L displaced patellar fx and pt is s/p ORIF L patella 8/22, he had started recovering with PT, etc but needed to return needing another sx (9/7) for patella hardwear removal and replacement. PMH significant for anemia, asthma, COPD, HTN, and skin CA.   Clinical Impression  Pt showed good effort with PT exam and ~10 minutes of exercises apart from the eval, but does need a lot of cuing and re-direction.  He struggled with advancing L LE (in [place]) and needed direct assist to take a step with the L each time.  Pt shows good effort but needs a lot of encouragement and cuing for even basic activities.     Follow Up Recommendations SNF    Equipment Recommendations       Recommendations for Other Services       Precautions / Restrictions Precautions Required Braces or Orthoses: Knee Immobilizer - Left Knee Immobilizer - Left: On at all times Restrictions Weight Bearing Restrictions: Yes LLE Weight Bearing: Weight bearing as tolerated      Mobility  Bed Mobility Overal bed mobility: Needs Assistance Bed Mobility: Supine to Sit     Supine to sit: Mod assist;Min assist     General bed mobility comments: Pt shows good effort with getting to EOB but needs assist moving L LE and to shift hips around to standing  Transfers Overall transfer level: Needs assistance Equipment used: Rolling walker (2 wheeled) Transfers: Sit to/from Stand Sit to Stand: Min assist         General transfer comment: Pt very anxious about getting up, needs heavy using for set up and hand placement.  Pt reliant on walker to maintain standing   Ambulation/Gait Ambulation/Gait assistance: Mod assist Ambulation Distance (Feet): 15  Feet Assistive device: Rolling walker (2 wheeled)       General Gait Details: Pt needing a lot of cuing and encouragement.  He was unable to advance L LE with KI donned and needed direct assist from PT to advance it each time.   Stairs            Wheelchair Mobility    Modified Rankin (Stroke Patients Only)       Balance                                             Pertinent Vitals/Pain Pain Assessment: 0-10 Pain Score: 3  (reports his pain as ver bad before meds)    Home Living Family/patient expects to be discharged to:: Skilled nursing facility Living Arrangements: Alone Available Help at Discharge: Family;Friend(s);Available PRN/intermittently                  Prior Function Level of Independence: Independent         Comments: Very active/busy     Hand Dominance        Extremity/Trunk Assessment   Upper Extremity Assessment: Overall WFL for tasks assessed           Lower Extremity Assessment: Generalized weakness (R LE grossly 4-/5, L (KI donned) has little AROM )         Communication  Communication: No difficulties  Cognition Arousal/Alertness: Awake/alert Behavior During Therapy: Restless Overall Cognitive Status: Within Functional Limits for tasks assessed                      General Comments      Exercises        Assessment/Plan    PT Assessment Patient needs continued PT services  PT Diagnosis Difficulty walking   PT Problem List Decreased range of motion;Decreased activity tolerance;Decreased balance;Decreased mobility;Decreased safety awareness;Decreased knowledge of use of DME;Decreased knowledge of precautions  PT Treatment Interventions DME instruction;Gait training;Functional mobility training;Therapeutic activities;Therapeutic exercise;Balance training;Patient/family education   PT Goals (Current goals can be found in the Care Plan section) Acute Rehab PT Goals Patient Stated Goal:  get this knee figured out PT Goal Formulation: With patient Time For Goal Achievement: 08/28/16 Potential to Achieve Goals: Fair    Frequency BID   Barriers to discharge        Co-evaluation               End of Session Equipment Utilized During Treatment: Left knee immobilizer;Gait belt Activity Tolerance: Patient tolerated treatment well Patient left: with call bell/phone within reach;with chair alarm set Nurse Communication: Mobility status         Time: [id number] PT Time Calculation (min) (ACUTE ONLY): 28 min   Charges:   PT Evaluation $PT Eval Low Complexity: 1 Procedure PT Treatments $Therapeutic Exercise: 8-22 mins   PT G Codes:        Kreg Shropshire, DPT 08/16/2016, 11:03 AM

## 2016-08-17 MED ORDER — HYDROCODONE-ACETAMINOPHEN 5-325 MG PO TABS: 1.0000 | ORAL_TABLET | ORAL | 0 refills | Status: DC | PRN

## 2016-08-17 MED ORDER — ENOXAPARIN SODIUM 40 MG/0.4ML ~~LOC~~ SOLN: 40.0000 mg | SUBCUTANEOUS | 0 refills | Status: DC

## 2016-08-17 MED ORDER — ASPIRIN EC 325 MG PO TBEC: 325.0000 mg | DELAYED_RELEASE_TABLET | Freq: Every day | ORAL | 0 refills | Status: DC

## 2016-08-17 NOTE — Progress Notes (Signed)
  Subjective: 2 Days Post-Op Procedure(s) (LRB): OPEN REDUCTION INTERNAL (ORIF) FIXATION PATELLA (Left) Patient reports pain as moderate.   Patient seen in rounds with Dr. Roland Rack. Patient is well, and has had no acute complaints or problems Plan is to go Rehab after hospital stay. Negative for chest pain and shortness of breath Fever: no Gastrointestinal: Negative for nausea and vomiting  Objective: Vital signs in last 24 hours: Temp:  [97.5 F (36.4 C)-98.7 F (37.1 C)] 97.5 F (36.4 C) (09/09 0408) Pulse Rate:  [67-81] 81 (09/09 0408) Resp:  [15-21] 20 (09/09 0408) BP: (94-116)/(43-50) 116/50 (09/09 0408) SpO2:  [95 %-96 %] 95 % (09/09 0408)  Intake/Output from previous day:  Intake/Output Summary (Last 24 hours) at 08/17/16 0700 Last data filed at 08/16/16 1843  Gross per 24 hour  Intake             1525 ml  Output              400 ml  Net             1125 ml    Intake/Output this shift: No intake/output data recorded.  Labs:  Recent Labs  08/15/16 1552  HGB 9.4*    Recent Labs  08/15/16 1552  WBC 5.9  RBC 2.76*  HCT 27.5*  PLT 286    Recent Labs  08/15/16 1552  CREATININE 1.11   No results for input(s): LABPT, INR in the last 72 hours.   EXAM General - Patient is Alert and Oriented Extremity - Intact pulses distally No cellulitis present Compartment soft Dressing/Incision - clean, dry, no drainage, the bandage was changed to a new smaller bandage. An Ace wrap was used with wrapping for venous stasis. The hinged brace was still intact and locked in extension Motor Function - intact, moving foot and toes well on exam. The patient ambulated 20 feet with physical therapy  Past Medical History:  Diagnosis Date  . Anemia   . Asthma   . BPH (benign prostatic hyperplasia)   . COPD (chronic obstructive pulmonary disease) (HCC)    not on home oxygen  . Hypotension   . Hypothyroidism   . Skin abnormalities   . Skin cancer     Assessment/Plan: 2  Days Post-Op Procedure(s) (LRB): OPEN REDUCTION INTERNAL (ORIF) FIXATION PATELLA (Left) Active Problems:   Left patella fracture  Estimated body mass index is 22.63 kg/m as calculated from the following:   Height as of 07/30/16: 5' 10.5" (1.791 m).   Weight as of 07/30/16: 72.6 kg (160 lb). Up with therapy Plan for discharge tomorrow Discharge to SNF  DVT Prophylaxis - Lovenox Weight-Bearing as tolerated to left leg  Reche Dixon, PA-C Orthopaedic Surgery 08/17/2016, 7:00 AM

## 2016-08-17 NOTE — Progress Notes (Signed)
Physical Therapy Treatment Patient Details Name: Kurt Hill MRN: [id number] DOB: 08/06/1932 Today's Date: 08/17/2016    History of Present Illness Pt is a 82 yr old male presenting 2 weeks ago with L knee pain and ROM limitation after a fall. Imaging revealed L displaced patellar fx, had ORIF L patella 8/22, he had started recovering with PT at home but needed to return needing another sx for patella hardwear removal and replacement. PMH significant for anemia, asthma, COPD, HTN, and skin CA.     PT Comments    Pt shows good effort this afternoon but is less confident with ambulation with slower cadence and needing heavy cuing and encouragement.  Pt continues to improve with mobility and ambulation. Pt c/o more of back pain than knee pain.   Follow Up Recommendations  SNF     Equipment Recommendations       Recommendations for Other Services       Precautions / Restrictions Precautions Precautions: Fall Required Braces or Orthoses: Knee Immobilizer - Left Knee Immobilizer - Left: On at all times Restrictions LLE Weight Bearing: Weight bearing as tolerated    Mobility  Bed Mobility Overal bed mobility: Needs Assistance Bed Mobility: Sit to Supine       Sit to supine: Min assist   General bed mobility comments: Pt shows good effort getting back up into bed, needs only very light assist to lift LE in brace  Transfers Overall transfer level: Needs assistance Equipment used: Rolling walker (2 wheeled) Transfers: Sit to/from Stand Sit to Stand: Min guard         General transfer comment: Pt continues to need extensive cuing for safety and sequencing, but overall did well.  Ambulation/Gait Ambulation/Gait assistance: Min guard;Min assist Ambulation Distance (Feet): 40 Feet Assistive device: Rolling walker (2 wheeled)       General Gait Details: Pt slower and more hesitant with ambulation this afternoon, but overall is safe and able to maintain slow but consistent  cadence.   Stairs            Wheelchair Mobility    Modified Rankin (Stroke Patients Only)       Balance                                    Cognition Arousal/Alertness: Awake/alert Behavior During Therapy: Restless Overall Cognitive Status: Within Functional Limits for tasks assessed                      Exercises General Exercises - Lower Extremity Ankle Circles/Pumps: 20 reps;Strengthening Quad Sets: Strengthening;15 reps Gluteal Sets: Strengthening;15 reps Hip ABduction/ADduction: 15 reps;Strengthening Straight Leg Raises: 10 reps;AROM    General Comments        Pertinent Vitals/Pain Pain Score: 5     Home Living                      Prior Function            PT Goals (current goals can now be found in the care plan section) Progress towards PT goals: Progressing toward goals    Frequency  BID    PT Plan Current plan remains appropriate    Co-evaluation             End of Session Equipment Utilized During Treatment: Left knee immobilizer;Gait belt Activity Tolerance: Patient tolerated treatment well Patient left: with  call bell/phone within reach;with chair alarm set     Time: 1334-1400 PT Time Calculation (min) (ACUTE ONLY): 26 min  Charges:  $Gait Training: 8-22 mins $Therapeutic Exercise: 8-22 mins                    G Codes:      Kreg Shropshire, DPT 08/17/2016, 3:46 PM

## 2016-08-17 NOTE — Discharge Instructions (Signed)
INSTRUCTIONS AFTER Surgery  o Remove items at home which could result in a fall. This includes throw rugs or furniture in walking pathways o ICE to the affected joint every three hours while awake for 30 minutes at a time, for at least the first 3-5 days, and then as needed for pain and swelling.  Continue to use ice for pain and swelling. You may notice swelling that will progress down to the foot and ankle.  This is normal after surgery.  Elevate your leg when you are not up walking on it.   o Continue to use the breathing machine you got in the hospital (incentive spirometer) which will help keep your temperature down.  It is common for your temperature to cycle up and down following surgery, especially at night when you are not up moving around and exerting yourself.  The breathing machine keeps your lungs expanded and your temperature down.   DIET:  As you were doing prior to hospitalization, we recommend a well-balanced diet.  DRESSING / WOUND CARE / SHOWERING  Evaluate the wound under the Ace wrap and daily, and continue to use the Ace wrap for venous stasis. The Ace wrap is to be wrapped from his foot up to his thigh. The knee immobilizer is to remain locked in extension with any ambulation or in bed until discontinued by Dr. Rudene Christians.  ACTIVITY  o Increase activity slowly as tolerated, but follow the weight bearing instructions below.   o No driving for 6 weeks or until further direction given by your physician.  You cannot drive while taking narcotics.  o No lifting or carrying greater than 10 lbs. until further directed by your surgeon. o Avoid periods of inactivity such as sitting longer than an hour when not asleep. This helps prevent blood clots.  o You may return to work once you are authorized by your doctor.     WEIGHT BEARING  Weight-bearing as tolerated with the hinged range of motion brace locked in extension.   EXERCISES Lower extremity strengthening with his brace  locked. Ambulation.  CONSTIPATION  Constipation is defined medically as fewer than three stools per week and severe constipation as less than one stool per week.  Even if you have a regular bowel pattern at home, your normal regimen is likely to be disrupted due to multiple reasons following surgery.  Combination of anesthesia, postoperative narcotics, change in appetite and fluid intake all can affect your bowels.   YOU MUST use at least one of the following options; they are listed in order of increasing strength to get the job done.  They are all available over the counter, and you may need to use some, POSSIBLY even all of these options:    Drink plenty of fluids (prune juice may be helpful) and high fiber foods Colace 100 mg by mouth twice a day  Senokot for constipation as directed and as needed Dulcolax (bisacodyl), take with full glass of water  Miralax (polyethylene glycol) once or twice a day as needed.  If you have tried all these things and are unable to have a bowel movement in the first 3-4 days after surgery call either your surgeon or your primary doctor.    If you experience loose stools or diarrhea, hold the medications until you stool forms back up.  If your symptoms do not get better within 1 week or if they get worse, check with your doctor.  If you experience "the worst abdominal pain ever" or  develop nausea or vomiting, please contact the office immediately for further recommendations for treatment.   ITCHING:  If you experience itching with your medications, try taking only a single pain pill, or even half a pain pill at a time.  You can also use Benadryl over the counter for itching or also to help with sleep.   TED HOSE STOCKINGS:  Use stockings on both legs until for at least 2 weeks or as directed by physician office. They may be removed at night for sleeping.  MEDICATIONS:  See your medication summary on the After Visit Summary that nursing will review with you.   You may have some home medications which will be placed on hold until you complete the course of blood thinner medication.  It is important for you to complete the blood thinner medication as prescribed.  PRECAUTIONS:  If you experience chest pain or shortness of breath - call 911 immediately for transfer to the hospital emergency department.   If you develop a fever greater that 101 F, purulent drainage from wound, increased redness or drainage from wound, foul odor from the wound/dressing, or calf pain - CONTACT YOUR SURGEON.                                                   FOLLOW-UP APPOINTMENTS:  If you do not already have a post-op appointment, please call the office for an appointment to be seen by your surgeon.  Guidelines for how soon to be seen are listed in your After Visit Summary, but are typically between 1-4 weeks after surgery.  OTHER INSTRUCTIONS:     MAKE SURE YOU:   Understand these instructions.   Get help right away if you are not doing well or get worse.    Thank you for letting us be a part of your medical care team.  It is a privilege we respect greatly.  We hope these instructions will help you stay on track for a fast and full recovery!

## 2016-08-17 NOTE — Plan of Care (Signed)
Problem: Tissue Perfusion: Goal: Risk factors for ineffective tissue perfusion will decrease Outcome: Progressing Pt has remained free of falls/injury this shift. Pt ambulated with physical therapy, is progressing toward goal of d/c to rehab possible tomorrow.

## 2016-08-17 NOTE — Progress Notes (Signed)
Pt has worked with physical therapy, been oob to chair for a few hours and returned to bed. l leg remains in immobilizer, small areas of serosanguinous drainage have appeared since pt has walked this morning, pt stated that Dr.  During rounds removed the dressing and replaced it. Pt in no distress, ivf continues, bilat eye drainage is minimal.

## 2016-08-17 NOTE — Progress Notes (Signed)
Physical Therapy Treatment Patient Details Name: Kurt Hill MRN: [id number] DOB: 31-Jan-1932 Today's Date: 08/17/2016    History of Present Illness Pt is a 82 yr old male presenting 2 weeks ago with L knee pain and ROM limitation after a fall. Imaging revealed L displaced patellar fx, had ORIF L patella 8/22, he had started recovering with PT at home but needed to return needing another sx for patella hardwear removal and replacement. PMH significant for anemia, asthma, COPD, HTN, and skin CA.     PT Comments    Pt is able to ambulate with much more consistent cadence this AM.  PT did put walker up 1 notch and pt was better able to distribute weight and actually was able to stand on L LE with confident WBing and improved speed and stability.  Pt still needing a lot of cuing and encouragement t/o the session but was able to stay on task better and with improved quality of motion/function.   Follow Up Recommendations  SNF     Equipment Recommendations       Recommendations for Other Services       Precautions / Restrictions Precautions Precautions: Fall Required Braces or Orthoses: Knee Immobilizer - Left Knee Immobilizer - Left: On at all times Restrictions Weight Bearing Restrictions: No LLE Weight Bearing: Weight bearing as tolerated    Mobility  Bed Mobility Overal bed mobility: Needs Assistance Bed Mobility: Sit to Supine     Supine to sit: Min assist;Min guard     General bed mobility comments: Pt better with mobility to EOB this AM, he still needs a lot of cuing and encouragement but requires less direct physical assist  Transfers Overall transfer level: Needs assistance Equipment used: Rolling walker (2 wheeled) Transfers: Sit to/from Stand Sit to Stand: Min assist         General transfer comment: Pt needing extensive cuing for positioning, etc but overall did well with most acts.    Ambulation/Gait Ambulation/Gait assistance: Min assist Ambulation  Distance (Feet): 65 Feet Assistive device: Rolling walker (2 wheeled)       General Gait Details: Pt does better with ambulation today and was able to take more consistent and confident steppage.  He continues to need a lot of encouragement and cuing but did surprisingly well with ambulation this AM with ability to advance L LE/clear toes and take increased WBing.    Stairs            Wheelchair Mobility    Modified Rankin (Stroke Patients Only)       Balance                                    Cognition Arousal/Alertness: Awake/alert Behavior During Therapy: Restless Overall Cognitive Status: Within Functional Limits for tasks assessed                      Exercises General Exercises - Lower Extremity Ankle Circles/Pumps: 20 reps;Strengthening Quad Sets: Strengthening;15 reps Gluteal Sets: Strengthening;15 reps Hip ABduction/ADduction: 15 reps;Strengthening Straight Leg Raises: 10 reps;AROM    General Comments        Pertinent Vitals/Pain Pain Score: 4     Home Living                      Prior Function            PT Goals (  current goals can now be found in the care plan section) Progress towards PT goals: Progressing toward goals    Frequency  BID    PT Plan Current plan remains appropriate    Co-evaluation             End of Session Equipment Utilized During Treatment: Left knee immobilizer;Gait belt Activity Tolerance: Patient tolerated treatment well Patient left: with call bell/phone within reach;with chair alarm set     Time: [id number] PT Time Calculation (min) (ACUTE ONLY): 25 min  Charges:  $Gait Training: 8-22 mins $Therapeutic Exercise: 8-22 mins                    G Codes:      Kreg Shropshire, DPT 08/17/2016, 10:57 AM

## 2016-08-17 NOTE — Progress Notes (Signed)
Shift assessment completed,see flowsheet. Pt is awake, alert and oriented, in no distress. Pt is on room air, lungs are clear bilat. S1S2 auscultated, abdomen is soft, bs hypoactive. Pt has urinal at bedside to void. L leg has immobilizer in place, bandage intact under this with ace wrap. Pt is able to move his toes of left foot, rotate foot at ankle, cap refill wnl, foot is warm, ppp. Rppp as well. PIV#20 intact to l fa with iv NS infusing at 74mls/hr, site is free of redness and swelling. Pt c/o only of slight pain to l leg. Srx2, hob is elevated. Pt's eyes have some yellow drainage, sclera to l eye slightly reddened, pt feels eye discharge has decreased.

## 2016-08-18 MED ORDER — FLEET ENEMA 7-19 GM/118ML RE ENEM
1.0000 | ENEMA | Freq: Once | RECTAL | Status: AC
  Administered 2016-08-18: 1 via RECTAL

## 2016-08-18 NOTE — Discharge Summary (Signed)
Physician Discharge Summary  Subjective: 3 Days Post-Op Procedure(s) (LRB): OPEN REDUCTION INTERNAL (ORIF) FIXATION PATELLA (Left) Patient reports pain as mild.   Patient seen in rounds with Dr. Roland Rack. Patient is well, and has had no acute complaints or problems. His dressing was reinforced because of some mild bleeding last night. Patient is ready to go to rehabilitation for physical therapy today.  Physician Discharge Summary  Patient ID: Kurt Hill MRN: [id number] DOB/AGE: 1932/04/01 82 y.o.  Admit date: 08/15/2016 Discharge date: 08/18/2016  Admission Diagnoses:  Discharge Diagnoses:  Active Problems:   Left patella fracture   Discharged Condition: fair  Hospital Course: The patient is postop day 3 from a left patella fracture ORIF that had to be redone. The patient has been in a hinged range of motion brace locked in extension with physical therapy. He did have his dressing changed yesterday and then reinforced last night. He has done physical therapy and is ambulating up to 40 feet with his brace locked in extension. The patient has done well with no other complications.  Treatments: surgery:  OPEN REDUCTION INTERNAL (ORIF) FIXATION PATELLA (Left) removal of prior hardware  SURGEON: Laurene Footman, MD  ASSISTANTS: None  ANESTHESIA:   general  EBL:  Total I/O In: 1100 [I.V.:1100] Out: 200 [Blood:200]  BLOOD ADMINISTERED:none  DRAINS: none   LOCAL MEDICATIONS USED:  NONE  SPECIMEN:  No Specimen  DISPOSITION OF SPECIMEN:  N/A  COUNTS:  YES  TOURNIQUET:    IMPLANTS: Zimmer patella clamp system and speed bridge from [employer]   Discharge Exam: Blood pressure (!) 123/55, pulse 74, temperature 98.2 F (36.8 C), temperature source Oral, resp. rate 16, height 5\' 6"  (1.676 m), weight 81 kg (178 lb 9.6 oz), SpO2 96 %.   Disposition: 06-Home-Health Care Svc     Medication List    TAKE these medications   aspirin EC 325 MG tablet Take 1 tablet  (325 mg total) by mouth daily. Hold this prescription until the Lovenox is complete in 2 weeks. What changed:  additional instructions   azithromycin 500 MG tablet Commonly known as:  ZITHROMAX Take 0.5 tablets (250 mg total) by mouth daily.   budesonide-formoterol 80-4.5 MCG/ACT inhaler Commonly known as:  SYMBICORT Inhale 2 puffs into the lungs 2 (two) times daily.   enoxaparin 40 MG/0.4ML injection Commonly known as:  LOVENOX Inject 0.4 mLs (40 mg total) into the skin daily.   finasteride 5 MG tablet Commonly known as:  PROSCAR Take 5 mg by mouth daily.   HYDROcodone-acetaminophen 5-325 MG tablet Commonly known as:  NORCO/VICODIN Take 1-2 tablets by mouth every 4 (four) hours as needed (breakthrough pain).   levothyroxine 100 MCG tablet Commonly known as:  SYNTHROID, LEVOTHROID Take 1 tablet by mouth daily.   predniSONE 10 MG tablet Commonly known as:  DELTASONE Label  & dispense according to the schedule below. 5 Pills PO for 1 day then, 4 Pills PO for 1 day, 3 Pills PO for 1 day, 2 Pills PO for 1 day, 1 Pill PO for 1 days then STOP.   albuterol (2.5 MG/3ML) 0.083% nebulizer solution Commonly known as:  PROVENTIL Take 2.5 mg by nebulization every 6 (six) hours as needed for wheezing or shortness of breath.   PROAIR HFA 108 (90 Base) MCG/ACT inhaler Generic drug:  albuterol Inhale 2 puffs into the lungs every 6 (six) hours as needed for wheezing or shortness of breath.   Vitamin D (Ergocalciferol) 50000 units Caps capsule Commonly known as:  DRISDOL Take 1 capsule by mouth every 7 (seven) days.       Contact information for follow-up providers    MENZ,MICHAEL, MD Follow up in 2 week(s).   Specialty:  Orthopedic Surgery Why:  Wound care and possible suture removal. Contact information: [place] [address] [phone]            Contact information for after-discharge care    Destination    [facility] .   Specialty:  [place] information: [address] [place] [id number]                  Signed: Prescott [place], Dayjah Selman 08/18/2016, 6:41 AM   Objective: Vital signs in last 24 hours: Temp:  [98.2 F (36.8 C)-98.3 F (36.8 C)] 98.2 F (36.8 C) (09/10 0406) Pulse Rate:  [74-80] 74 (09/10 0406) Resp:  [16] 16 (09/10 0406) BP: (105-123)/(49-60) 123/55 (09/10 0406) SpO2:  [91 %-100 %] 96 % (09/10 0406) Weight:  [81 kg (178 lb 9.6 oz)] 81 kg (178 lb 9.6 oz) (09/09 2006)  Intake/Output from previous day:  Intake/Output Summary (Last 24 hours) at 08/18/16 0641 Last data filed at 08/18/16 0622  Gross per 24 hour  Intake           2532.5 ml  Output             1900 ml  Net            632.5 ml    Intake/Output this shift: Total I/O In: 1167.5 [P.O.:240; I.V.:927.5] Out: 1000 [Urine:1000]  Labs:  Recent Labs  08/15/16 1552  HGB 9.4*    Recent Labs  08/15/16 1552  WBC 5.9  RBC 2.76*  HCT 27.5*  PLT 286    Recent Labs  08/15/16 1552  CREATININE 1.11   No results for input(s): LABPT, INR in the last 72 hours.  EXAM: General - Patient is Alert and Oriented Extremity - Neurovascular intact Dorsiflexion/Plantar flexion intact No cellulitis present Incision - clean, dry, blood tinged drainage, with new dressing last night. Motor Function -  the patient is able to plantarflex and dorsiflex his foot. He ambulated 40 feet with physical therapy.  Assessment/Plan: 3 Days Post-Op Procedure(s) (LRB): OPEN REDUCTION INTERNAL (ORIF) FIXATION PATELLA (Left) Procedure(s) (LRB): OPEN REDUCTION INTERNAL (ORIF) FIXATION PATELLA (Left) Past Medical History:  Diagnosis Date  . Anemia   . Asthma   . BPH (benign prostatic hyperplasia)   . COPD (chronic obstructive pulmonary disease) (HCC)    not on home oxygen  . Hypotension   . Hypothyroidism   . Skin abnormalities   . Skin cancer    Active  Problems:   Left patella fracture  Estimated body mass index is 28.83 kg/m as calculated from the following:   Height as of this encounter: 5\' 6"  (1.676 m).   Weight as of this encounter: 81 kg (178 lb 9.6 oz). Up with therapy Discharge to SNF Diet - Regular diet Follow up - in 2 weeks Activity - WBAT with his brace locked in extension. No flexing the knee. Disposition - Rehab Condition Upon Discharge - Stable DVT Prophylaxis - Lovenox  Reche Dixon, PA-C Orthopaedic Surgery 08/18/2016, 6:41 AM

## 2016-08-18 NOTE — Progress Notes (Signed)
Pt was dc'd to rehab via EMS at 1700. Pt received med for pain at 1600, was in no distress at time of d/c.

## 2016-08-18 NOTE — Clinical Social Work Placement (Signed)
   CLINICAL SOCIAL WORK PLACEMENT  NOTE  Date:  08/18/2016  Patient Details  Name: Kurt Hill MRN: [id number] Date of Birth: 06/27/1932  Clinical Social Work is seeking post-discharge placement for this patient at the [place] level of care (*CSW will initial, date and re-position this form in  chart as items are completed):  Yes   Patient/family provided with [place] Work Department's list of facilities offering this level of care within the geographic area requested by the patient (or if unable, by the patient's family).  Yes   Patient/family informed of their freedom to choose among providers that offer the needed level of care, that participate in Medicare, Medicaid or managed care program needed by the patient, have an available bed and are willing to accept the patient.  Yes   Patient/family informed of [place]'s ownership interest in [facility] and [facility], as well as of the fact that they are under no obligation to receive care at these facilities.  PASRR submitted to EDS on       PASRR number received on       Existing PASRR number confirmed on 08/16/16     FL2 transmitted to all facilities in geographic area requested by pt/family on 08/16/16     FL2 transmitted to all facilities within larger geographic area on       Patient informed that his/her managed care company has contracts with or will negotiate with certain facilities, including the following:        Yes   Patient/family informed of bed offers received.  Patient chooses bed at  ([facility] )     Physician recommends and patient chooses bed at  ([facility])    Patient to be transferred to  ([facility]) on 08/18/16.  Patient to be transferred to facility by  (Non-emergent EMS)     Patient family notified on 08/18/16 of transfer.  Name of family member notified:  Patient declined CSW notification of family     PHYSICIAN       Additional Comment:     _______________________________________________ Zettie Pho, LCSW 08/18/2016, 9:05 AM

## 2016-08-18 NOTE — Progress Notes (Signed)
  Subjective: 3 Days Post-Op Procedure(s) (LRB): OPEN REDUCTION INTERNAL (ORIF) FIXATION PATELLA (Left) Patient reports pain as moderate.   Patient seen in rounds with Dr. Roland Rack. Patient is well, and has had no acute complaints or problems Plan is to go Rehab after hospital stay. Probable discharged today Negative for chest pain and shortness of breath Fever: no Gastrointestinal: Negative for nausea and vomiting  Objective: Vital signs in last 24 hours: Temp:  [98.2 F (36.8 C)-98.3 F (36.8 C)] 98.2 F (36.8 C) (09/10 0406) Pulse Rate:  [74-80] 74 (09/10 0406) Resp:  [16] 16 (09/10 0406) BP: (105-123)/(49-60) 123/55 (09/10 0406) SpO2:  [91 %-100 %] 96 % (09/10 0406) Weight:  [81 kg (178 lb 9.6 oz)] 81 kg (178 lb 9.6 oz) (09/09 2006)  Intake/Output from previous day:  Intake/Output Summary (Last 24 hours) at 08/18/16 0638 Last data filed at 08/18/16 0622  Gross per 24 hour  Intake           2532.5 ml  Output             1900 ml  Net            632.5 ml    Intake/Output this shift: Total I/O In: 1167.5 [P.O.:240; I.V.:927.5] Out: 1000 [Urine:1000]  Labs:  Recent Labs  08/15/16 1552  HGB 9.4*    Recent Labs  08/15/16 1552  WBC 5.9  RBC 2.76*  HCT 27.5*  PLT 286    Recent Labs  08/15/16 1552  CREATININE 1.11   No results for input(s): LABPT, INR in the last 72 hours.   EXAM General - Patient is Alert and Oriented Extremity - Intact pulses distally No cellulitis present Compartment soft Dressing/Incision - clean, dry, no drainage, With the bandage reinforced last night because of some mild bleeding. The hinged brace was still intact and locked in extension Motor Function - intact, moving foot and toes well on exam. The patient ambulated 40 feet with physical therapy  Past Medical History:  Diagnosis Date  . Anemia   . Asthma   . BPH (benign prostatic hyperplasia)   . COPD (chronic obstructive pulmonary disease) (HCC)    not on home oxygen  .  Hypotension   . Hypothyroidism   . Skin abnormalities   . Skin cancer     Assessment/Plan: 3 Days Post-Op Procedure(s) (LRB): OPEN REDUCTION INTERNAL (ORIF) FIXATION PATELLA (Left) Active Problems:   Left patella fracture  Estimated body mass index is 28.83 kg/m as calculated from the following:   Height as of this encounter: 5\' 6"  (1.676 m).   Weight as of this encounter: 81 kg (178 lb 9.6 oz). Continue physical therapy. Discharge to rehabilitation today. Follow-up in the [place] clinic in 2 weeks.  DVT Prophylaxis - Lovenox Weight-Bearing as tolerated to left leg  Reche Dixon, PA-C Orthopaedic Surgery 08/18/2016, 6:38 AM

## 2016-08-18 NOTE — Progress Notes (Signed)
Physical Therapy Treatment Patient Details Name: Kurt Hill MRN: [id number] DOB: January 20, 1932 Today's Date: 08/18/2016    History of Present Illness Pt is a 82 yr old male presenting 2 weeks ago with L knee pain and ROM limitation after a fall. Imaging revealed L displaced patellar fx, had ORIF L patella 8/22, he had started recovering with PT at home but needed to return needing another sx for patella hardwear removal and replacement. PMH significant for anemia, asthma, COPD, HTN, and skin CA.     PT Comments    Pt continues to make slow and steady gains.  He lacks confidence and needs consistent cuing and encouragement but has been able to be more and more functional.  Pt walked ~75 ft today w/o any LOBs though he remains slow and guarded with the effort.    Follow Up Recommendations  SNF     Equipment Recommendations       Recommendations for Other Services       Precautions / Restrictions Precautions Precautions: Fall Required Braces or Orthoses: Knee Immobilizer - Left Knee Immobilizer - Left: On at all times Restrictions Weight Bearing Restrictions: Yes LLE Weight Bearing: Weight bearing as tolerated    Mobility  Bed Mobility Overal bed mobility: Needs Assistance Bed Mobility: Sit to Supine     Supine to sit: Min guard     General bed mobility comments: Pt slow/cautious, but able to get up to sitting w/o direct assist  Transfers Overall transfer level: Needs assistance Equipment used: Rolling walker (2 wheeled) Transfers: Sit to/from Stand Sit to Stand: Min guard         General transfer comment: Again pt needing extensive cuing to insure set up, safety and sequencing.  Ambulation/Gait Ambulation/Gait assistance: Min guard Ambulation Distance (Feet): 75 Feet Assistive device: Rolling walker (2 wheeled)       General Gait Details: Pt able to increase distance and speed, but remains weak, hesitant and needing a lot of cuing/encouragement.     Stairs            Wheelchair Mobility    Modified Rankin (Stroke Patients Only)       Balance                                    Cognition Arousal/Alertness: Awake/alert Behavior During Therapy: Restless Overall Cognitive Status: Within Functional Limits for tasks assessed                      Exercises General Exercises - Lower Extremity Ankle Circles/Pumps: 20 reps;Strengthening Quad Sets: Strengthening;15 reps Gluteal Sets: Strengthening;15 reps Hip ABduction/ADduction: 15 reps;Strengthening Straight Leg Raises: AROM;15 reps    General Comments        Pertinent Vitals/Pain Pain Score: 6     Home Living                      Prior Function            PT Goals (current goals can now be found in the care plan section) Progress towards PT goals: Progressing toward goals    Frequency  BID    PT Plan Current plan remains appropriate    Co-evaluation             End of Session Equipment Utilized During Treatment: Left knee immobilizer;Gait belt Activity Tolerance: Patient tolerated treatment well Patient left: with call  bell/phone within reach;with chair alarm set     Time: [phone] PT Time Calculation (min) (ACUTE ONLY): 29 min  Charges:  $Gait Training: 8-22 mins $Therapeutic Exercise: 8-22 mins                    G Codes:      Kreg Shropshire, DPT 08/18/2016, 10:37 AM

## 2016-08-18 NOTE — Plan of Care (Signed)
Problem: Tissue Perfusion: Goal: Risk factors for ineffective tissue perfusion will decrease Outcome: Adequate for Discharge Pt is to be dc'd to rehab.

## 2016-08-18 NOTE — Progress Notes (Signed)
Shift assessment completed at 0730. Pt is c/o pain to his l leg, received pain medication at this time, pt also received suppository, this writer explained that pt needs to have BM before anticipated d/c to rehab today. See flowsheet. Pt's bilat eyes are draining thick yellowish discharge, this has crusted around his eyes. Sclera red to l outer eye. Pt is on room air, denied sob,respiartions are unlabored. S1S2 heard. Abdomen is soft, nontender, bs heard. Pt is voiding in urinal prn,L leg remains in immobilizer, dressing appears clean on the surface, orth has already rounded on pt. PPP, pt is albeot move his L toes, cap refill wnl. PIV#20 intact to l arm with iv ns infusing at 55mls/hr, site is free of redness and swelling. Pt has walked in the hall with PT this am and returned to recliner. Pt aware that further laxative will be given if no bm soon.

## 2016-08-18 NOTE — Clinical Social Work Note (Signed)
Patient to dc to [facility] via non-emergent EMS. Patient reported that he does not want CSW to inform his family at this time citing that he has already done so. Facility is aware. CSW will con't to follow for any additional needs.  Santiago Bumpers, MSW LCSW-A [phone]

## 2016-08-19 ENCOUNTER — Encounter: Payer: Self-pay | Admitting: Orthopedic Surgery

## 2016-08-19 NOTE — Anesthesia Postprocedure Evaluation (Signed)
Anesthesia Post Note  Patient: Kurt Hill  Procedure(s) Performed: Procedure(s) (LRB): OPEN REDUCTION INTERNAL (ORIF) FIXATION PATELLA (Left)  Patient location during evaluation: PACU Anesthesia Type: General Level of consciousness: awake and alert Pain management: pain level controlled Vital Signs Assessment: post-procedure vital signs reviewed and stable Respiratory status: spontaneous breathing, nonlabored ventilation, respiratory function stable and patient connected to nasal cannula oxygen Cardiovascular status: blood pressure returned to baseline and stable Postop Assessment: no signs of nausea or vomiting Anesthetic complications: no    Last Vitals:  Vitals:   08/18/16 1500 08/18/16 1603  BP: (!) 144/62   Pulse: 75   Resp: 18   Temp:  36.9 C    Last Pain:  Vitals:   08/18/16 1603  TempSrc: Oral  PainSc:                  Molli Barrows

## 2016-08-27 NOTE — H&P (Signed)
Progress Notes - in this encounter  Table of Contents for Progress Notes [name], [place] - 08/13/2016 3:00 PM EDT [name], CMA - 08/13/2016 3:00 PM EDT   [name], [place] - 08/13/2016 3:00 PM EDT Formatting of this note may be different from the original. Chief Complaint: Chief Complaint  Patient presents with  . Post Operative Visit  PO ORIF left patella sx 07/30/16   Kurt Hill is a 82 y.o. male who presents today status post ORIF left patella, date of surgery 07/30/2016 by Dr. Rudene Christians. Patient is doing well. Pain is mild. Denies any numbness or tingling. He has been undergoing physical therapy at home, working on ambulation with knee immobilizer. He has not been working on any active or passive flexion. His pain is controlled. He denies any new trauma or injury. No fevers, calf pain, swelling, warmth erythema or drainage. Patient has been ambulating with a walker. Of note, patient states to get in and out of his vehicle he has had to remove the knee immobilizer to flex the knee.  Past Medical History: Past Medical History:  Diagnosis Date  . Allergic rhinitis  . Anemia  . Asthma without status asthmaticus  . Cataracts, bilateral  . Cellulitis  . Chest pain  occasional  . COPD (chronic obstructive pulmonary disease) (CMS-HCC)  . Depression  . Difficulty walking  ataxia  . Disorders of bursae and tendons in shoulder region, unspecified  . Hx of pleurisy  . Hypotension  . Hypothyroidism  . Nephrolithiasis  . Osteoporosis, post-menopausal  Questionable  . Pernicious anemia  . RMSF [facility] spotted fever)  hx of  . Sliding hiatal hernia 01/01/2007  Barium swallow  . SOB (shortness of breath)  . Varicose veins   Past Surgical History: Past Surgical History:  Procedure Laterality Date  . APPENDECTOMY 1946  . COLONOSCOPY 09/23/2011  02/03/2008; Adenomatous Polyps: CBF 09/2016; No repeat due to age 10 per RTE (dw)  . Open  Reduction Internal ORIF fixation patella Left 07/30/2016  Dr.Darry Kelnhofer  . ORAL SURGERY OPERATIVE NOTE 2008  . Repair torn blood vessel 1945  . Submucous resection 1960  . TONSILLECTOMY 1952   Past Family History: Family History  Problem Relation Age of Onset  . Diabetes mellitus Mother  . Stroke Mother  . Ulcers Brother  half brother with bleeding ulcer  . Hypertension Other  family hx  . Asthma Other  family hx   Medications: Current Outpatient Prescriptions Ordered in Epic  Medication Sig Dispense Refill  . acetaminophen (TYLENOL) 325 MG tablet Take 650 mg by mouth every 4 (four) hours as needed for Pain.  Marland Kitchen albuterol (PROAIR HFA) 90 mcg/actuation inhaler Inhale 2 inhalations into the lungs every 6 (six) hours as needed for Wheezing. 1 Inhaler 12  . clobetasol (CORMAX) 0.05 % external solution MIX IN 1 TUB OF CERAVE CREAM AND APPLY TO ECZEMA TWICE A DAY AS DIRECTED 2  . ergocalciferol, vitamin D2, 50,000 unit capsule Take 1 capsule by mouth once every week 13 capsule 3  . FERROUS SULFATE ORAL Take by mouth.  . finasteride (PROSCAR) 5 mg tablet Take 1 tablet (5 mg total) by mouth once daily. 90 tablet 1  . HYDROcodone-acetaminophen (NORCO) 5-325 mg tablet Take 1 tablet by mouth every 4 (four) hours as needed. 0  . hydrocortisone 2.5 % cream Apply topically once daily as needed.  Marland Kitchen levothyroxine (SYNTHROID, LEVOTHROID) 100 MCG tablet Take 1 tablet (100 mcg total) by mouth once daily. Take  on an empty stomach with a glass of water at least 30-60 minutes before breakfast. 90 tablet 3  . midodrine (PROAMATINE) 2.5 MG tablet Take 1 tablet (2.5 mg total) by mouth 3 (three) times daily. 90 tablet 5  . triamcinolone acetonide (ORALONE) 0.1 % paste Apply topically 3 (three) times daily. To oral lesion 5 g 0  . trimethoprim-polymyxin b (POLYTRIM) ophthalmic solution TAKE 1 DROP(S) IN BOTH EYES 4 TIMES A DAY FOR 14 DAYS 1   No current Epic-ordered facility-administered medications on file.    Allergies: Allergies  Allergen Reactions  . Iodinated Contrast- Oral And Iv Dye Hives  . Sulfa (Sulfonamide Antibiotics) Rash    Review of Systems:  A comprehensive 14 point ROS was performed, reviewed by me today, and the pertinent orthopaedic findings are documented in the HPI.  Exam: BP 126/78 (BP Location: Left upper arm, Patient Position: Sitting, BP Cuff Size: Adult)  Ht 177.8 cm (5\' 10" )  Wt 72.6 kg (160 lb)  BMI 22.96 kg/m2  General: Well developed, well nourished 82 y.o. male in no apparent distress. Normal affect. Normal communication. Patient answers questions appropriately. The patient presents in a wheelchair. Knee flexed 20 without knee immobilizer on.  HEENT: Head normocephalic, atraumatic, PERRL.   Abdomen: Soft, non tender, non distended, Bowel sounds present.  Heart: Examination of the heart reveals regular, rate, and rhythm. There is no murmur noted on ascultation. There is a normal apical pulse.  Lungs: Lungs are clear to auscultation. There is no wheeze, rhonchi, or crackles. There is normal expansion of bilateral chest walls.   Left lower Extremities: Examination of the left knee shows the patient has incision site is intact. Staples are removed, Steri-Strips are applied. Patient presents with knee immobilizer but the knee is flexed 20. He is tender to palpation along the patella. Mild swelling throughout the left lower extremity. No warmth erythema or drainage. Patient is unable to actively maintain extension. He has mild eczema along the anterior lower leg. No signs of cellulitis. Negative Homans sign.  Imaging: AP and lateral views of the left knee were ordered interpreted by me in the office today. Impression: Patient has transverse patella fracture with separation compared to postoperative x-rays. Cerclage wires in figure-of-eight has pulled through the distal patellar fragment.  Impression: Closed displaced transverse fracture of left patella,  initial encounter [S82.032A] Closed displaced transverse fracture of left patella, initial encounter (primary encounter diagnosis) S/P ORIF (open reduction internal fixation) fracture, left patella Failed orthopedic implant, initial encounter (CMS-HCC)  Plan:  1. Risks, benefits, complications of the left patella hardware removal and open reduction with internal fixation were discussed with the patient. Patient has agreed and consented to the procedure with Dr. Rudene Christians. Patient will continue a knee immobilizer until surgery scheduled.  This note was generated in part with voice recognition software and I apologize for any typographical errors that were not detected and corrected.  [name] MPA-C

## 2016-08-30 ENCOUNTER — Other Ambulatory Visit: Payer: Self-pay | Admitting: Orthopedic Surgery

## 2016-08-30 ENCOUNTER — Inpatient Hospital Stay
Admission: AD | Admit: 2016-08-30 | Discharge: 2016-09-02 | DRG: 863 | Disposition: A | Discharge status: Skilled Nursing Facility | Payer: Medicare Other | Source: Ambulatory Visit | Attending: Orthopedic Surgery | Admitting: Orthopedic Surgery

## 2016-08-30 ENCOUNTER — Encounter: Payer: Self-pay | Admitting: Cardiology

## 2016-08-30 DIAGNOSIS — I2510 Atherosclerotic heart disease of native coronary artery without angina pectoris: Secondary | ICD-10-CM | POA: Diagnosis present

## 2016-08-30 DIAGNOSIS — Z7982 Long term (current) use of aspirin: Secondary | ICD-10-CM

## 2016-08-30 DIAGNOSIS — J449 Chronic obstructive pulmonary disease, unspecified: Secondary | ICD-10-CM | POA: Diagnosis present

## 2016-08-30 DIAGNOSIS — Z79899 Other long term (current) drug therapy: Secondary | ICD-10-CM | POA: Diagnosis not present

## 2016-08-30 DIAGNOSIS — Z9049 Acquired absence of other specified parts of digestive tract: Secondary | ICD-10-CM | POA: Diagnosis not present

## 2016-08-30 DIAGNOSIS — Z882 Allergy status to sulfonamides status: Secondary | ICD-10-CM | POA: Diagnosis not present

## 2016-08-30 DIAGNOSIS — Z91041 Radiographic dye allergy status: Secondary | ICD-10-CM

## 2016-08-30 DIAGNOSIS — T814XXA Infection following a procedure, initial encounter: Secondary | ICD-10-CM | POA: Diagnosis present

## 2016-08-30 DIAGNOSIS — T8130XA Disruption of wound, unspecified, initial encounter: Secondary | ICD-10-CM | POA: Diagnosis present

## 2016-08-30 DIAGNOSIS — L03115 Cellulitis of right lower limb: Secondary | ICD-10-CM

## 2016-08-30 DIAGNOSIS — L03116 Cellulitis of left lower limb: Secondary | ICD-10-CM | POA: Insufficient documentation

## 2016-08-30 DIAGNOSIS — Z85828 Personal history of other malignant neoplasm of skin: Secondary | ICD-10-CM

## 2016-08-30 DIAGNOSIS — E039 Hypothyroidism, unspecified: Secondary | ICD-10-CM | POA: Diagnosis present

## 2016-08-30 DIAGNOSIS — Z9889 Other specified postprocedural states: Secondary | ICD-10-CM | POA: Diagnosis not present

## 2016-08-30 DIAGNOSIS — L0390 Cellulitis, unspecified: Secondary | ICD-10-CM | POA: Diagnosis present

## 2016-08-30 DIAGNOSIS — T8140XA Infection following a procedure, unspecified, initial encounter: Secondary | ICD-10-CM

## 2016-08-30 HISTORY — DX: Atherosclerotic heart disease of native coronary artery without angina pectoris: I25.10

## 2016-08-30 LAB — BASIC METABOLIC PANEL
Anion gap: 4 — ABNORMAL LOW (ref 5–15)
BUN: 24 mg/dL — AB (ref 6–20)
CHLORIDE: 100 mmol/L — AB (ref 101–111)
CO2: 32 mmol/L (ref 22–32)
CREATININE: 1.1 mg/dL (ref 0.61–1.24)
Calcium: 8.9 mg/dL (ref 8.9–10.3)
GFR calc Af Amer: >60 mL/min (ref >60)
GFR calc non Af Amer: >60 mL/min (ref >60)
GLUCOSE: 116 mg/dL — AB (ref 65–99)
POTASSIUM: 5 mmol/L (ref 3.5–5.1)
Sodium: 136 mmol/L (ref 135–145)

## 2016-08-30 LAB — CBC
HEMATOCRIT: 31.7 % — AB (ref 40.0–52.0)
HEMOGLOBIN: 10.8 g/dL — AB (ref 13.0–18.0)
MCH: 33.2 pg (ref 26.0–34.0)
MCHC: 34 g/dL (ref 32.0–36.0)
MCV: 97.7 fL (ref 80.0–100.0)
Platelets: 669 10*3/uL — ABNORMAL HIGH (ref 150–440)
RBC: 3.25 MIL/uL — AB (ref 4.40–5.90)
RDW: 14.4 % (ref 11.5–14.5)
WBC: 8.8 10*3/uL (ref 3.8–10.6)

## 2016-08-30 LAB — C-REACTIVE PROTEIN: CRP: 30.4 mg/dL — ABNORMAL HIGH (ref <1.0)

## 2016-08-30 LAB — SURGICAL PCR SCREEN
MRSA, PCR: POSITIVE — AB
Staphylococcus aureus: POSITIVE — AB

## 2016-08-30 LAB — SEDIMENTATION RATE: SED RATE: 116 mm/h — AB (ref 0–20)

## 2016-08-30 MED ORDER — MORPHINE SULFATE (PF) 2 MG/ML IV SOLN: 2.0000 mg | INTRAVENOUS | Status: DC | PRN

## 2016-08-30 MED ORDER — DOCUSATE SODIUM 100 MG PO CAPS
100.0000 mg | ORAL_CAPSULE | Freq: Two times a day (BID) | ORAL | Status: DC
  Administered 2016-08-30 – 2016-09-02 (×6): 100 mg via ORAL
  Filled 2016-08-30 (×6): qty 1

## 2016-08-30 MED ORDER — MAGNESIUM CITRATE PO SOLN
1.0000 | Freq: Once | ORAL | Status: DC | PRN
  Filled 2016-08-30: qty 296

## 2016-08-30 MED ORDER — VANCOMYCIN HCL IN DEXTROSE 1-5 GM/200ML-% IV SOLN
1000.0000 mg | INTRAVENOUS | Status: DC
  Administered 2016-08-30 – 2016-09-01 (×3): 1000 mg via INTRAVENOUS
  Filled 2016-08-30 (×4): qty 200

## 2016-08-30 MED ORDER — ACETAMINOPHEN 325 MG PO TABS: 650.0000 mg | ORAL_TABLET | Freq: Four times a day (QID) | ORAL | Status: DC | PRN

## 2016-08-30 MED ORDER — ONDANSETRON HCL 4 MG/2ML IJ SOLN: 4.0000 mg | Freq: Four times a day (QID) | INTRAMUSCULAR | Status: DC | PRN

## 2016-08-30 MED ORDER — ONDANSETRON HCL 4 MG PO TABS: 4.0000 mg | ORAL_TABLET | Freq: Four times a day (QID) | ORAL | Status: DC | PRN

## 2016-08-30 MED ORDER — DEXTROSE 5 % IV SOLN
500.0000 mg | Freq: Four times a day (QID) | INTRAVENOUS | Status: DC | PRN
  Filled 2016-08-30: qty 5

## 2016-08-30 MED ORDER — DIPHENHYDRAMINE HCL 12.5 MG/5ML PO ELIX: 12.5000 mg | ORAL_SOLUTION | ORAL | Status: DC | PRN

## 2016-08-30 MED ORDER — MUPIROCIN 2 % EX OINT
1.0000 "application " | TOPICAL_OINTMENT | Freq: Two times a day (BID) | CUTANEOUS | Status: DC
  Administered 2016-08-30 – 2016-09-02 (×5): 1 via NASAL
  Filled 2016-08-30 (×2): qty 22

## 2016-08-30 MED ORDER — ACETAMINOPHEN 650 MG RE SUPP: 650.0000 mg | Freq: Four times a day (QID) | RECTAL | Status: DC | PRN

## 2016-08-30 MED ORDER — OXYCODONE HCL 5 MG PO TABS
5.0000 mg | ORAL_TABLET | ORAL | Status: DC | PRN
  Administered 2016-08-30 – 2016-08-31 (×4): 10 mg via ORAL
  Administered 2016-09-01: 5 mg via ORAL
  Administered 2016-09-01: 10 mg via ORAL
  Administered 2016-09-01: 5 mg via ORAL
  Administered 2016-09-02 (×2): 10 mg via ORAL
  Filled 2016-08-30: qty 1
  Filled 2016-08-30 (×6): qty 2
  Filled 2016-08-30: qty 1
  Filled 2016-08-30: qty 2

## 2016-08-30 MED ORDER — BISACODYL 10 MG RE SUPP: 10.0000 mg | Freq: Every day | RECTAL | Status: DC | PRN

## 2016-08-30 MED ORDER — METHOCARBAMOL 500 MG PO TABS: 500.0000 mg | ORAL_TABLET | Freq: Four times a day (QID) | ORAL | Status: DC | PRN

## 2016-08-30 MED ORDER — MAGNESIUM HYDROXIDE 400 MG/5ML PO SUSP
30.0000 mL | Freq: Every day | ORAL | Status: DC | PRN
  Administered 2016-09-01 – 2016-09-02 (×3): 30 mL via ORAL
  Filled 2016-08-30 (×3): qty 30

## 2016-08-30 MED ORDER — INFLUENZA VAC SPLIT QUAD 0.5 ML IM SUSY
0.5000 mL | PREFILLED_SYRINGE | INTRAMUSCULAR | Status: DC
Start: 2016-08-31 — End: 2016-09-02
  Filled 2016-08-30: qty 0.5

## 2016-08-30 MED ORDER — SODIUM CHLORIDE 0.9 % IV SOLN
INTRAVENOUS | Status: DC
  Administered 2016-08-30 – 2016-09-02 (×4): via INTRAVENOUS

## 2016-08-30 MED ORDER — ZOLPIDEM TARTRATE 5 MG PO TABS: 5.0000 mg | ORAL_TABLET | Freq: Every evening | ORAL | Status: DC | PRN

## 2016-08-30 MED ORDER — CHLORHEXIDINE GLUCONATE CLOTH 2 % EX PADS
6.0000 | MEDICATED_PAD | Freq: Every day | CUTANEOUS | Status: DC
  Administered 2016-09-02: 6 via TOPICAL

## 2016-08-30 MED ORDER — VANCOMYCIN HCL IN DEXTROSE 1-5 GM/200ML-% IV SOLN
1000.0000 mg | Freq: Once | INTRAVENOUS | Status: AC
  Administered 2016-08-30: 1000 mg via INTRAVENOUS
  Filled 2016-08-30: qty 200

## 2016-08-30 NOTE — NC FL2 (Signed)
[place] LEVEL OF CARE SCREENING TOOL     IDENTIFICATION  Patient Name: Kurt Hill Birthdate: 12/29/1931 Sex: male Admission Date (Current Location): 08/30/2016  [place] and [place] Number:  Engineering geologist and Address:  [facility], [address] [place], [place] [address]      Provider Number: [id number]  Attending Physician Name and Address:  Hessie Knows, MD  Relative Name and Phone Number:       Current Level of Care: Hospital Recommended Level of Care: [place] Prior Approval Number:    Date Approved/Denied:   PASRR Number:  ( [id number] )  Discharge Plan: SNF    Current Diagnoses: Patient Active Problem List   Diagnosis Date Noted  . Cellulitis of left leg 08/30/2016  . Post op infection 08/30/2016  . Left patella fracture 08/15/2016  . Patella fracture 07/30/2016  . COPD exacerbation ([place]) 12/14/2015    Orientation RESPIRATION BLADDER Height & Weight     Self, Time, Situation, Place  Normal Continent Weight: 178 lb 9.2 oz (81 kg) Height:  5\' 6"  (167.6 cm)  BEHAVIORAL SYMPTOMS/MOOD NEUROLOGICAL BOWEL NUTRITION STATUS   (none )  (none ) Continent Diet (Regular )  AMBULATORY STATUS COMMUNICATION OF NEEDS Skin   Extensive Assist Verbally Surgical wounds    Wound VAC                   Personal Care Assistance Level of Assistance  Bathing, Feeding, Dressing Bathing Assistance: Limited assistance Feeding assistance: Independent Dressing Assistance: Limited assistance     Functional Limitations Info  Sight, Hearing, Speech Sight Info: Impaired Hearing Info: Adequate Speech Info: Adequate    SPECIAL CARE FACTORS FREQUENCY  PT (By licensed PT), OT (By licensed OT)     PT Frequency:  (5) OT Frequency:  (5)            Contractures      Additional Factors Info  Code Status, Allergies Code Status Info:  (Full Code. ) Allergies Info:  (Ivp Dye Iodinated Diagnostic Agents,  Sulfa Antibiotics)           Current Medications (08/30/2016):  This is the current hospital active medication list Current Facility-Administered Medications  Medication Dose Route Frequency Provider Last Rate Last Dose  . 0.9 %  sodium chloride infusion   Intravenous Continuous Hessie Knows, MD      . acetaminophen (TYLENOL) tablet 650 mg  650 mg Oral Q6H PRN Hessie Knows, MD       Or  . acetaminophen (TYLENOL) suppository 650 mg  650 mg Rectal Q6H PRN Hessie Knows, MD      . bisacodyl (DULCOLAX) suppository 10 mg  10 mg Rectal Daily PRN Hessie Knows, MD      . diphenhydrAMINE (BENADRYL) 12.5 MG/5ML elixir 12.5-25 mg  12.5-25 mg Oral Q4H PRN Hessie Knows, MD      . docusate sodium (COLACE) capsule 100 mg  100 mg Oral BID Hessie Knows, MD      . Derrill Memo ON 08/31/2016] Influenza vac split quadrivalent PF (FLUARIX) injection 0.5 mL  0.5 mL Intramuscular Tomorrow-1000 Hessie Knows, MD      . magnesium citrate solution 1 Bottle  1 Bottle Oral Once PRN Hessie Knows, MD      . magnesium hydroxide (MILK OF MAGNESIA) suspension 30 mL  30 mL Oral Daily PRN Hessie Knows, MD      . methocarbamol (ROBAXIN) tablet 500 mg  500 mg Oral Q6H PRN Legrand Como  Rudene Christians, MD       Or  . methocarbamol (ROBAXIN) 500 mg in dextrose 5 % 50 mL IVPB  500 mg Intravenous Q6H PRN Hessie Knows, MD      . morphine 2 MG/ML injection 2 mg  2 mg Intravenous Q2H PRN Hessie Knows, MD      . ondansetron [facility]) tablet 4 mg  4 mg Oral Q6H PRN Hessie Knows, MD       Or  . ondansetron [facility]) injection 4 mg  4 mg Intravenous Q6H PRN Hessie Knows, MD      . oxyCODONE (Oxy IR/ROXICODONE) immediate release tablet 5-10 mg  5-10 mg Oral Q3H PRN Hessie Knows, MD      . vancomycin (VANCOCIN) IVPB 1000 mg/200 mL premix  1,000 mg Intravenous Once Lenis Noon, RPH      . zolpidem (AMBIEN) tablet 5 mg  5 mg Oral QHS PRN Hessie Knows, MD         Discharge Medications: Please see discharge summary for a list of discharge  medications.  Relevant Imaging Results:  Relevant Lab Results:   Additional Information  (SSN: [id number] Will possible need IV ABX. )  Kurt Hill, Kurt Beets, LCSW

## 2016-08-30 NOTE — Progress Notes (Signed)
Pharmacy Antibiotic Note  Kurt Hill is a 82 y.o. male admitted on 08/30/2016 with cellulitis.  Pharmacy has been consulted for vancomycin dosing.  Plan: Vancomycin 1000 mg one time dose followed by vancomycin 1000 mg IV q24h (8 hour stacked dose) Goal vancomycin trough 10-15 mcg/mL Vancomycin trough on 9/25 @ 2030 which is prior to 5th dose and should represent steady state  Kinetics: Using adjusted body weight = 71 kg Ke: 0.048 Half-life: ~15 hrs VD: ~50 L  Cmin (estimated) ~ 12 mcg/mL   Height: 5\' 6"  (167.6 cm) Weight: 178 lb 9.2 oz (81 kg) IBW/kg (Calculated) : 63.8  Temp (24hrs), Avg:98.3 F (36.8 C), Min:98.3 F (36.8 C), Max:98.3 F (36.8 C)   Recent Labs Lab 08/30/16 1217  WBC 8.8  CREATININE 1.10    Estimated Creatinine Clearance: 51.8 mL/min (by C-G formula based on SCr of 1.1 mg/dL).    Allergies  Allergen Reactions  . Ivp Dye [Iodinated Diagnostic Agents] Itching and Rash  . Sulfa Antibiotics Rash    Antimicrobials this admission: vancomycin 9/22 >>   Dose adjustments this admission:  Microbiology results: Wound: Sent  Thank you for allowing pharmacy to be a part of this patient's care.  Lenis Noon, PharmD 08/30/2016 1:55 PM

## 2016-08-30 NOTE — Clinical Social Work Note (Signed)
Clinical Social Work Assessment  Patient Details  Name: Kurt Hill MRN: [id number] Date of Birth: 05/13/1932  Date of referral:  08/30/16               Reason for consult:  Facility Placement, Other (Comment Required) (From [facility]. )                Permission sought to share information with:  Chartered certified accountant granted to share information::  Yes, Verbal Permission Granted  Name::        Agency::     Relationship::     Contact Information:     Housing/Transportation Living arrangements for the past 2 months:  [place], [place] of Information:  Patient, Facility Patient Interpreter Needed:  None Criminal Activity/Legal Involvement Pertinent to Current Situation/Hospitalization:  No - Comment as needed Significant Relationships:  Adult Children Lives with:  Self Do you feel safe going back to the place where you live?  Yes Need for family participation in patient care:  Yes (Comment)  Care giving concerns:  Patient is from [facility].    Social Worker assessment / plan:  Holiday representative (CSW) reviewed chart and noted that patient is a readmit from [facility]. Patient was a direct admit from Dr. Theodore Demark office for a possible infection. Per PA cultures are pending and patient may need IV ABX. Per PA patient will not be ready until Monday at the earliest. CSW met with patient alone at bedside to discuss D/C plan. Patient reported that he had a bad experience at [facility] and does not want to go back there. Patient reported that he lives in [place] and his sons [name] and Antony Haste check on him. 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 Patient reported that he would like to consider going home with home health if possible. Patient is agreeable to SNF search in [facility].  FL2 complete and faxed out. CSW presented bed offers. Patient reported he would consider [facility].  Patient reported that he would like to see CSW on Monday to formulate a plan because he really wants to go home if possible. CSW will continue to follow and assist as needed.    Employment status:  Retired Forensic scientist:  Medicare PT Recommendations:  Not assessed at this time [place] / Referral to community resources:  [place]  Patient/Family's Response to care:  Patient is undecided about going home to rehab. CSW will meet with patient on Monday to discuss plan.   Patient/Family's Understanding of and Emotional Response to Diagnosis, Current Treatment, and Prognosis:  Patient was very pleasant and thanked CSW for assistance.   Emotional Assessment Appearance:  Appears stated age Attitude/Demeanor/Rapport:    Affect (typically observed):  Accepting, Adaptable, Pleasant Orientation:  Oriented to Self, Oriented to Place, Oriented to  Time, Oriented to Situation Alcohol / Substance use:  Not Applicable Psych involvement (Current and /or in the community):  No (Comment)  Discharge Needs  Concerns to be addressed:  Discharge Planning Concerns Readmission within the last 30 days:  No Current discharge risk:  Dependent with Mobility Barriers to Discharge:  Continued Medical Work up   [employer], [name], LCSW 08/30/2016, 2:39 PM

## 2016-08-30 NOTE — H&P (Signed)
Subjective:   Patient is a 82 y.o. male presents with drainage from left knee inciison. Onset of symptoms was gradual starting 3 days ago with gradually worsening course since that time. The pain is located anterior knee.. Patient describes the pain as sharp at times intermittent and rated as moderate. Pain has been associated with recent surgery for patella fracture. Patient denies chills. Symptoms are aggravated by standing. Symptoms improve with nothing. Past history includes prior ORIF/repeat ORIF.  Previous studies include xrays..  Patient Active Problem List   Diagnosis Date Noted  . Cellulitis of left leg 08/30/2016  . Post op infection 08/30/2016  . Left patella fracture 08/15/2016  . Patella fracture 07/30/2016  . COPD exacerbation ([place]) 12/14/2015   Past Medical History:  Diagnosis Date  . Anemia   . Asthma   . BPH (benign prostatic hyperplasia)   . COPD (chronic obstructive pulmonary disease) (HCC)    not on home oxygen  . Hypotension   . Hypothyroidism   . Skin abnormalities   . Skin cancer     Past Surgical History:  Procedure Laterality Date  . APPENDECTOMY    . BONE RESECTION     on nose  . MOHS SURGERY    . ORIF PATELLA Left 07/30/2016   Procedure: OPEN REDUCTION INTERNAL (ORIF) FIXATION PATELLA;  Surgeon: Hessie Knows, MD;  Location: [facility] ORS;  Service: Orthopedics;  Laterality: Left;  . ORIF PATELLA Left 08/15/2016   Procedure: OPEN REDUCTION INTERNAL (ORIF) FIXATION PATELLA;  Surgeon: Hessie Knows, MD;  Location: [facility] ORS;  Service: Orthopedics;  Laterality: Left;  . TONSILLECTOMY      Prescriptions Prior to Admission  Medication Sig Dispense Refill Last Dose  . albuterol (PROAIR HFA) 108 (90 Base) MCG/ACT inhaler Inhale 2 puffs into the lungs every 6 (six) hours as needed for wheezing or shortness of breath.    Past Week at Unknown time  . albuterol (PROVENTIL) (2.5 MG/3ML) 0.083% nebulizer solution Take 2.5 mg by nebulization every 6 (six) hours as needed for  wheezing or shortness of breath.   08/14/2016 at Unknown time  . aspirin EC 325 MG tablet Take 1 tablet (325 mg total) by mouth daily. Hold this prescription until the Lovenox is complete in 2 weeks. 30 tablet 0   . azithromycin (ZITHROMAX) 500 MG tablet Take 0.5 tablets (250 mg total) by mouth daily. (Patient not taking: Reported on 01/11/2016) 5 tablet 0 Not Taking at Unknown time  . budesonide-formoterol (SYMBICORT) 80-4.5 MCG/ACT inhaler Inhale 2 puffs into the lungs 2 (two) times daily. 1 Inhaler 12 08/14/2016 at Unknown time  . enoxaparin (LOVENOX) 40 MG/0.4ML injection Inject 0.4 mLs (40 mg total) into the skin daily. 14 Syringe 0   . finasteride (PROSCAR) 5 MG tablet Take 5 mg by mouth daily.   08/14/2016 at Unknown time  . HYDROcodone-acetaminophen (NORCO/VICODIN) 5-325 MG tablet Take 1-2 tablets by mouth every 4 (four) hours as needed (breakthrough pain). 30 tablet 0   . levothyroxine (SYNTHROID, LEVOTHROID) 100 MCG tablet Take 1 tablet by mouth daily.   08/14/2016 at Unknown time  . predniSONE (DELTASONE) 10 MG tablet Label  & dispense according to the schedule below. 5 Pills PO for 1 day then, 4 Pills PO for 1 day, 3 Pills PO for 1 day, 2 Pills PO for 1 day, 1 Pill PO for 1 days then STOP. (Patient not taking: Reported on 01/11/2016) 15 tablet 0 Not Taking at Unknown time  . Vitamin D, Ergocalciferol, (DRISDOL) 50000 units CAPS  capsule Take 1 capsule by mouth every 7 (seven) days.   08/14/2016 at Unknown time   Allergies  Allergen Reactions  . Ivp Dye [Iodinated Diagnostic Agents] Itching and Rash  . Sulfa Antibiotics Rash    Social History  Substance Use Topics  . Smoking status: Never Smoker  . Smokeless tobacco: Never Used     Comment: tried cigarettes when young- but never really smoked  . Alcohol use No    No family history on file.  Review of Systems Pertinent items are noted in HPI.  Objective:   Patient Vitals for the past 8 hrs:  BP Temp Temp src Pulse Resp SpO2  08/30/16 1059  140/73 98.3 F (36.8 C) Oral 78 18 100 %   No intake/output data recorded. No intake/output data recorded.    BP 140/73 (BP Location: Left Arm)   Pulse 78   Temp 98.3 F (36.8 C) (Oral)   Resp 18   Ht 5\' 6"  (1.676 m)   Wt 81 kg (178 lb 9.2 oz)   SpO2 100%   BMI 28.82 kg/m  General appearance: alert and mild distress Lungs: clear to auscultation bilaterally Heart: regular rate and rhythm, S1, S2 normal, no murmur, click, rub or gallop Extremities: erytema around left knee. drainage is copious, somewhat purulent.   Data Reviewlabs pending  Assessment:   Active Problems:   Post op infection   Plan:   vanco per pharmacy until cultures return.  Wound vac to incision.  May require surgical debridement.

## 2016-08-30 NOTE — Clinical Social Work Placement (Signed)
   CLINICAL SOCIAL WORK PLACEMENT  NOTE  Date:  08/30/2016  Patient Details  Name: Kurt Hill MRN: [id number] Date of Birth: [birth year]  Clinical Social Work is seeking post-discharge placement for this patient at the [place] level of care (*CSW will initial, date and re-position this form in  chart as items are completed):  Yes   Patient/family provided with [place] Work Department's list of facilities offering this level of care within the geographic area requested by the patient (or if unable, by the patient's family).  Yes   Patient/family informed of their freedom to choose among providers that offer the needed level of care, that participate in Medicare, Medicaid or managed care program needed by the patient, have an available bed and are willing to accept the patient.  Yes   Patient/family informed of [facility]'s ownership interest in [facility] and [facility], as well as of the fact that they are under no obligation to receive care at these facilities.  PASRR submitted to EDS on       PASRR number received on       Existing PASRR number confirmed on 08/30/16     FL2 transmitted to all facilities in geographic area requested by pt/family on 08/30/16     FL2 transmitted to all facilities within larger geographic area on       Patient informed that his/her managed care company has contracts with or will negotiate with certain facilities, including the following:        Yes   Patient/family informed of bed offers received.  Patient chooses bed at       Physician recommends and patient chooses bed at      Patient to be transferred to   on  .  Patient to be transferred to facility by       Patient family notified on   of transfer.  Name of family member notified:        PHYSICIAN       Additional Comment:    _______________________________________________ Aarion Metzgar, [name], LCSW 08/30/2016, 2:36 PM

## 2016-08-30 NOTE — Progress Notes (Signed)
I visited the patient upon the request of a nurse. Patient was in the good spirit and talked about his treatment and family. He was very pleased with my visit and requested for prayer and spiritual support for quick recovery.

## 2016-08-31 LAB — CREATININE, SERUM: Creatinine, Ser: 0.88 mg/dL (ref 0.61–1.24)

## 2016-08-31 NOTE — Progress Notes (Signed)
Pt has been cooperative wood vac to left knee, elevated. Dr. Rudene Christians in to see him. Intake and output good. Pian meds given for knee.

## 2016-08-31 NOTE — Progress Notes (Signed)
  Subjective:   Cellulitis of left leg, s/p ORIF Patient reports pain as mild.   Patient is well, and has had no acute complaints or problems Care management to assist with discharge. Negative for chest pain and shortness of breath Fever: no Gastrointestinal:Negative for nausea and vomiting  Objective: Vital signs in last 24 hours: Temp:  [97.5 F (36.4 C)-98.4 F (36.9 C)] 97.5 F (36.4 C) (09/23 0727) Pulse Rate:  [71-80] 73 (09/23 0727) Resp:  [14-18] 18 (09/23 0727) BP: (109-140)/(54-73) 128/62 (09/23 0727) SpO2:  [79 %-100 %] 99 % (09/23 0727) Weight:  [81 kg (178 lb 9.2 oz)] 81 kg (178 lb 9.2 oz) (09/22 1200)  Intake/Output from previous day:  Intake/Output Summary (Last 24 hours) at 08/31/16 0731 Last data filed at 08/31/16 0700  Gross per 24 hour  Intake           379.17 ml  Output              750 ml  Net          -370.83 ml    Intake/Output this shift: No intake/output data recorded.  Labs:  Recent Labs  08/30/16 1217  HGB 10.8*    Recent Labs  08/30/16 1217  WBC 8.8  RBC 3.25*  HCT 31.7*  PLT 669*    Recent Labs  08/30/16 1217 08/31/16 0409  NA 136  --   K 5.0  --   CL 100*  --   CO2 32  --   BUN 24*  --   CREATININE 1.10 0.88  GLUCOSE 116*  --   CALCIUM 8.9  --    No results for input(s): LABPT, INR in the last 72 hours.  EXAM General - Patient is Alert, Appropriate and Oriented Extremity - ABD soft Sensation intact distally Intact pulses distally Incision: Woundvac in place, minimal drainage  No erythema visualized around the wound, Knee ROM brace in place Dressing/Incision - clean Motor Function - intact, moving foot and toes well on exam.   Pt able to plantarflex and dorsiflex foot without pain.  Past Medical History:  Diagnosis Date  . Anemia   . Asthma   . BPH (benign prostatic hyperplasia)   . COPD (chronic obstructive pulmonary disease) (HCC)    not on home oxygen  . Coronary artery disease   . Hypotension   .  Hypothyroidism   . Skin abnormalities   . Skin cancer     Assessment/Plan:    Active Problems:   Post op infection   Cellulitis  Estimated body mass index is 28.82 kg/m as calculated from the following:   Height as of this encounter: 5\' 6"  (1.676 m).   Weight as of this encounter: 81 kg (178 lb 9.2 oz).  Awaiting wound cultures, still in process, will continue Vanc until cultures return. Woundvac to remain on knee. May require surgical debridement.  DVT Prophylaxis - Foot Pumps and TED hose Patient is currently on bed rest.  Raquel Vernell Back, PA-C [facility] Orthopaedic Surgery 08/31/2016, 7:31 AM

## 2016-09-01 LAB — CREATININE, SERUM
Creatinine, Ser: 0.93 mg/dL (ref 0.61–1.24)
GFR calc non Af Amer: >60 mL/min (ref >60)

## 2016-09-01 LAB — CBC
HCT: 29.7 % — ABNORMAL LOW (ref 40.0–52.0)
Hemoglobin: 10 g/dL — ABNORMAL LOW (ref 13.0–18.0)
MCH: 33.2 pg (ref 26.0–34.0)
MCHC: 33.6 g/dL (ref 32.0–36.0)
MCV: 98.6 fL (ref 80.0–100.0)
PLATELETS: 659 10*3/uL — AB (ref 150–440)
RBC: 3.01 MIL/uL — AB (ref 4.40–5.90)
RDW: 14.4 % (ref 11.5–14.5)
WBC: 6.6 10*3/uL (ref 3.8–10.6)

## 2016-09-01 NOTE — Progress Notes (Addendum)
Subjective:   Cellulitis of left leg, s/p ORIF Patient reports pain as mild.   Patient is well, and has had no acute complaints or problems Care management to assist with discharge. Negative for chest pain and shortness of breath Fever: no Gastrointestinal:Negative for nausea and vomiting  Objective: Vital signs in last 24 hours: Temp:  [98.2 F (36.8 C)-99.7 F (37.6 C)] 98.2 F (36.8 C) (09/24 0313) Pulse Rate:  [63-74] 63 (09/24 0313) Resp:  [18-19] 19 (09/24 0313) BP: (123-148)/(60-70) 123/65 (09/24 0313) SpO2:  [98 %-100 %] 100 % (09/24 0313)  Intake/Output from previous day:  Intake/Output Summary (Last 24 hours) at 09/01/16 0840 Last data filed at 09/01/16 0718  Gross per 24 hour  Intake          2590.83 ml  Output             2575 ml  Net            15.83 ml    Intake/Output this shift: Total I/O In: -  Out: 225 [Urine:225]  Labs:  Recent Labs  08/30/16 1217  HGB 10.8*    Recent Labs  08/30/16 1217  WBC 8.8  RBC 3.25*  HCT 31.7*  PLT 669*    Recent Labs  08/30/16 1217 08/31/16 0409 09/01/16 0330  NA 136  --   --   K 5.0  --   --   CL 100*  --   --   CO2 32  --   --   BUN 24*  --   --   CREATININE 1.10 0.88 0.93  GLUCOSE 116*  --   --   CALCIUM 8.9  --   --    No results for input(s): LABPT, INR in the last 72 hours.  EXAM General - Patient is Alert, Appropriate and Oriented Extremity - ABD soft Sensation intact distally Intact pulses distally Incision: Woundvac in place, minimal drainage  No erythema visualized around the wound, Knee ROM brace in place Dressing/Incision - clean Motor Function - intact, moving foot and toes well on exam.   Pt able to plantarflex and dorsiflex foot without pain.  Past Medical History:  Diagnosis Date  . Anemia   . Asthma   . BPH (benign prostatic hyperplasia)   . COPD (chronic obstructive pulmonary disease) (HCC)    not on home oxygen  . Coronary artery disease   . Hypotension   .  Hypothyroidism   . Skin abnormalities   . Skin cancer     Assessment/Plan:    Active Problems:   Post op infection   Cellulitis  Estimated body mass index is 28.82 kg/m as calculated from the following:   Height as of this encounter: 5\' 6"  (1.676 m).   Weight as of this encounter: 81 kg (178 lb 9.2 oz).  Awaiting wound cultures, will continue Vanc until cultures return.  Gram stain demonstrated rare WBC, PMN and Mononuclear.  No organisms seen.  Culture is still in process. Woundvac to remain on knee. Will place order for CBC today to check WBC. Spoke with nurse about assisting the patient to the bedside toilet when a bowel movement occurs so long as he stays non-weightbearing to the left leg. May require surgical debridement.  DVT Prophylaxis - Foot Pumps and TED hose Patient is currently on bed rest.  Non-weightbearing to the left leg.  Raquel James, PA-C [facility] Orthopaedic Surgery 09/01/2016, 8:40 AM   Wound vac dressing changed, marked improvement in erythema  and drainage..  Will need wound vac for 2-3 weeks.

## 2016-09-01 NOTE — Clinical Social Work Note (Signed)
CSW visited patient at bedside to make bed offers for SNF placement. The patient has appropriate anxiety concerning his care and worry about PT causing a need for a third surgery. CSW gave the patient emotional support and empathy regarding the situation and confirmed that his SNF choice is [facility]. CSW will con't to follow.  Santiago Bumpers, MSW, LCSW-A [phone]

## 2016-09-02 MED ORDER — VANCOMYCIN HCL IN DEXTROSE 1-5 GM/200ML-% IV SOLN: 1000.0000 mg | INTRAVENOUS | 0 refills | Status: AC

## 2016-09-02 MED ORDER — MUPIROCIN 2 % EX OINT: 1.0000 "application " | TOPICAL_OINTMENT | Freq: Two times a day (BID) | CUTANEOUS | 0 refills | Status: DC

## 2016-09-02 MED ORDER — OXYCODONE HCL 5 MG PO TABS: 5.0000 mg | ORAL_TABLET | ORAL | 0 refills | Status: DC | PRN

## 2016-09-02 MED ORDER — VANCOMYCIN HCL IN DEXTROSE 1-5 GM/200ML-% IV SOLN: 1000.0000 mg | INTRAVENOUS | 0 refills | Status: DC

## 2016-09-02 NOTE — Progress Notes (Signed)
Patient refused flu shot.  Deri Fuelling, RN

## 2016-09-02 NOTE — Progress Notes (Signed)
Patient is medically stable for D/C to [facility] today. Per Editor, commissioning at [employer] patient can come today to room 33-B. Per Jonathon a wound vac will be delivered to [facility] today. RN will call report and arrange EMS for transport. Clinical Education officer, museum (CSW) sent D/C orders to [facility] via [employer]. Patient is aware of above. CSW left patient's son [name] a Advertising account executive. Please reconsult if future social work needs arise. CSW signing off.   [employer], LCSW [phone]

## 2016-09-02 NOTE — Progress Notes (Signed)
  Subjective:   Cellulitis of left leg, s/p ORIF Patient reports pain as mild.   Patient is well, and has had no acute complaints or problems Care management to assist with discharge. Negative for chest pain and shortness of breath Fever: no Gastrointestinal:Negative for nausea and vomiting  Objective: Vital signs in last 24 hours: Temp:  [97.7 F (36.5 C)-99 F (37.2 C)] 99 F (37.2 C) (09/25 0423) Pulse Rate:  [65-88] 65 (09/25 0423) Resp:  [18] 18 (09/25 0423) BP: (92-124)/(53-62) 124/62 (09/25 0423) SpO2:  [96 %-100 %] 98 % (09/25 0423)  Intake/Output from previous day:  Intake/Output Summary (Last 24 hours) at 09/02/16 0756 Last data filed at 09/02/16 0408  Gross per 24 hour  Intake          1125.84 ml  Output             1650 ml  Net          -524.16 ml    Intake/Output this shift: No intake/output data recorded.  Labs:  Recent Labs  08/30/16 1217 09/01/16 0330  HGB 10.8* 10.0*    Recent Labs  08/30/16 1217 09/01/16 0330  WBC 8.8 6.6  RBC 3.25* 3.01*  HCT 31.7* 29.7*  PLT 669* 659*    Recent Labs  08/30/16 1217 08/31/16 0409 09/01/16 0330  NA 136  --   --   K 5.0  --   --   CL 100*  --   --   CO2 32  --   --   BUN 24*  --   --   CREATININE 1.10 0.88 0.93  GLUCOSE 116*  --   --   CALCIUM 8.9  --   --    No results for input(s): LABPT, INR in the last 72 hours.  EXAM General - Patient is Alert, Appropriate and Oriented Extremity - ABD soft Sensation intact distally Intact pulses distally Incision: Woundvac in place, minimal drainage  No erythema visualized around the wound, Knee ROM brace in place Dressing/Incision - clean Motor Function - intact, moving foot and toes well on exam. Able to plantarflex and dorsiflex foot without pain.  Past Medical History:  Diagnosis Date  . Anemia   . Asthma   . BPH (benign prostatic hyperplasia)   . COPD (chronic obstructive pulmonary disease) (HCC)    not on home oxygen  . Coronary artery  disease   . Hypotension   . Hypothyroidism   . Skin abnormalities   . Skin cancer     Assessment/Plan:    Active Problems:   Post op infection   Cellulitis  Estimated body mass index is 28.82 kg/m as calculated from the following:   Height as of this encounter: 5\' 6"  (1.676 m).   Weight as of this encounter: 81 kg (178 lb 9.2 oz).  Continue with wound vac and IV abx. No organisms seen on cultures Non weightbearing Left lower extremity. Must keep LLE in extension at all times. Plan on discharge to rehab facility with wound vac, will need wound vac for 2-3 weeks. Change wound vac every 3 days.    DVT Prophylaxis - Foot Pumps and TED hose Patient is currently on bed rest.  Non-weightbearing to the left leg.  Duanne Guess, PA-C [facility] Orthopaedic Surgery 09/02/2016, 7:56 AM

## 2016-09-02 NOTE — Care Management Important Message (Signed)
Important Message  Patient Details  Name: Kurt Hill MRN: [id number] Date of Birth: Mar 02, 1932   Medicare Important Message Given:  Yes    Jolly Mango, RN 09/02/2016, 9:17 AM

## 2016-09-02 NOTE — Discharge Instructions (Signed)
Diet: As you were doing prior to hospitalization   Shower:  Keep left leg clean and dry.  Dressing:  Wound vac. Change every 3 days. Keep knee immobilizer on and knee straight at all times    Activity:  NO LEFT KNEE ROM. NON-WEIGHT BEARING LEFT LOWER EXTREMITY  Weight Bearing:  Non-Weight bearing left lower extremity  To prevent constipation: you may use a stool softener such as -  Colace (over the counter) 100 mg by mouth twice a day  Drink plenty of fluids (prune juice may be helpful) and high fiber foods Miralax (over the counter) for constipation as needed.    Itching:  If you experience itching with your medications, try taking only a single pain pill, or even half a pain pill at a time.  You may take up to 10 pain pills per day, and you can also use benadryl over the counter for itching or also to help with sleep.   Precautions:  If you experience chest pain or shortness of breath - call 911 immediately for transfer to the hospital emergency department!!  If you develop a fever greater that 101 F, purulent drainage from wound, increased redness or drainage from wound, or calf pain-Call [place]                                              Follow- Up Appointment:  Please call for an appointment to be seen in 2 weeks at [facility]

## 2016-09-02 NOTE — Progress Notes (Signed)
Patient concerned about left knee being injured, patient requested a x-ray of the knee. Asked PA Arvella Nigh and he said that Dr. Rudene Christians physically examined the knee and did not see a need for x-ray. Patient was told and he stated he understood.  Deri Fuelling, RN

## 2016-09-02 NOTE — Clinical Social Work Placement (Signed)
   CLINICAL SOCIAL WORK PLACEMENT  NOTE  Date:  09/02/2016  Patient Details  Name: Kurt Hill MRN: [id number] Date of Birth: [birth year]  Clinical Social Work is seeking post-discharge placement for this patient at the [place] level of care (*CSW will initial, date and re-position this form in  chart as items are completed):  Yes   Patient/family provided with [place] Work Department's list of facilities offering this level of care within the geographic area requested by the patient (or if unable, by the patient's family).  Yes   Patient/family informed of their freedom to choose among providers that offer the needed level of care, that participate in Medicare, Medicaid or managed care program needed by the patient, have an available bed and are willing to accept the patient.  Yes   Patient/family informed of [facility]'s ownership interest in [facility] and [facility], as well as of the fact that they are under no obligation to receive care at these facilities.  PASRR submitted to EDS on       PASRR number received on       Existing PASRR number confirmed on 08/30/16     FL2 transmitted to all facilities in geographic area requested by pt/family on 08/30/16     FL2 transmitted to all facilities within larger geographic area on       Patient informed that his/her managed care company has contracts with or will negotiate with certain facilities, including the following:        Yes   Patient/family informed of bed offers received.  Patient chooses bed at  [facility] )     Physician recommends and patient chooses bed at      Patient to be transferred to  [facility] ) on 09/02/16.  Patient to be transferred to facility by  [facility] )     Patient family notified on 09/02/16 of transfer.  Name of family member notified:   ([place] left patient's son [name] a Advertising account executive. )     PHYSICIAN       Additional  Comment:    _______________________________________________ Abby Stines, [name], LCSW 09/02/2016, 12:11 PM

## 2016-09-02 NOTE — Progress Notes (Signed)
Report called and given to RN at [facility]. All questions answered. EMS called for transport.  Deri Fuelling, RN

## 2016-09-02 NOTE — Discharge Summary (Signed)
Physician Discharge Summary  Patient ID: Kurt Hill MRN: [id number] DOB/AGE: July 07, 1932 82 y.o.  Admit date: 08/30/2016 Discharge date: 09/02/2016  Admission Diagnoses:  Left knee post op infection   Discharge Diagnoses: Patient Active Problem List   Diagnosis Date Noted  . Cellulitis of left leg 08/30/2016  . Post op infection 08/30/2016  . Cellulitis 08/30/2016  . Left patella fracture 08/15/2016  . Patella fracture 07/30/2016  . COPD exacerbation ([place]) 12/14/2015    Past Medical History:  Diagnosis Date  . Anemia   . Asthma   . BPH (benign prostatic hyperplasia)   . COPD (chronic obstructive pulmonary disease) (HCC)    not on home oxygen  . Coronary artery disease   . Hypotension   . Hypothyroidism   . Skin abnormalities   . Skin cancer      Transfusion: none   Consultants (if any):   Discharged Condition: Improved  Hospital Course: Kurt Hill is an 82 y.o. male who was admitted 08/30/2016 with a diagnosis of left Knee cellulitis with wound dehiscence. Cultures were obtained, showing no growth after 48 hours. Patient was started on IV antibiotics. Wound VAC was applied. Wound VAC was changed after 2 days with significant improvement of the wound. Patient is discharged on 09/02/2016 to rehabilitation facility with continuation of IV antibiotics and wound VAC. Wound that will need to be changed every 3 days. Patient is made nonweightbearing to the left lower extremity and he is unable to perform any range of motion to the left knee. Patient will follow-up with [facility] orthopedics in 2 weeks  He was given perioperative antibiotics:  Anti-infectives    Start     Dose/Rate Route Frequency Ordered Stop   09/02/16 0000  vancomycin (VANCOCIN) 1-5 GM/200ML-% SOLN     1,000 mg 200 mL/hr over 60 Minutes Intravenous Every 24 hours 09/02/16 0814     08/30/16 2100  vancomycin (VANCOCIN) IVPB 1000 mg/200 mL premix     1,000 mg 200 mL/hr over 60 Minutes Intravenous Every 24 hours  08/30/16 1355     08/30/16 1300  vancomycin (VANCOCIN) IVPB 1000 mg/200 mL premix     1,000 mg 200 mL/hr over 60 Minutes Intravenous  Once 08/30/16 1208 08/30/16 1353    .  He was given sequential compression devices, early ambulation, and 81 mg of aspirin for DVT prophylaxis.  He benefited maximally from the hospital stay and there were no complications.    Recent vital signs:  Vitals:   09/01/16 1956 09/02/16 0423  BP: (!) 93/53 124/62  Pulse: 85 65  Resp: 18 18  Temp: 98.7 F (37.1 C) 99 F (37.2 C)    Recent laboratory studies:  Lab Results  Component Value Date   HGB 10.0 (L) 09/01/2016   HGB 10.8 (L) 08/30/2016   HGB 9.4 (L) 08/15/2016   Lab Results  Component Value Date   WBC 6.6 09/01/2016   PLT 659 (H) 09/01/2016   Lab Results  Component Value Date   INR 1.1 01/29/2013   Lab Results  Component Value Date   NA 136 08/30/2016   K 5.0 08/30/2016   CL 100 (L) 08/30/2016   CO2 32 08/30/2016   BUN 24 (H) 08/30/2016   CREATININE 0.93 09/01/2016   GLUCOSE 116 (H) 08/30/2016    Discharge Medications:     Medication List    STOP taking these medications   enoxaparin 40 MG/0.4ML injection Commonly known as:  LOVENOX     TAKE these medications  aspirin EC 81 MG tablet Take 81 mg by mouth every morning.   aspirin EC 325 MG tablet Take 1 tablet (325 mg total) by mouth daily. Hold this prescription until the Lovenox is complete in 2 weeks.   azithromycin 500 MG tablet Commonly known as:  ZITHROMAX Take 0.5 tablets (250 mg total) by mouth daily.   SYMBICORT 80-4.5 MCG/ACT inhaler Generic drug:  budesonide-formoterol Inhale 2 puffs into the lungs 2 (two) times daily.   budesonide-formoterol 80-4.5 MCG/ACT inhaler Commonly known as:  SYMBICORT Inhale 2 puffs into the lungs 2 (two) times daily.   finasteride 5 MG tablet Commonly known as:  PROSCAR Take 5 mg by mouth daily.   finasteride 5 MG tablet Commonly known as:  PROSCAR Take 5 mg by  mouth every morning.   HYDROcodone-acetaminophen 5-325 MG tablet Commonly known as:  NORCO/VICODIN Take 1-2 tablets by mouth every 4 (four) hours as needed (breakthrough pain).   levothyroxine 100 MCG tablet Commonly known as:  SYNTHROID, LEVOTHROID Take 1 tablet by mouth daily.   mupirocin ointment 2 % Commonly known as:  BACTROBAN Place 1 application into the nose 2 (two) times daily.   oxyCODONE 5 MG immediate release tablet Commonly known as:  Oxy IR/ROXICODONE Take 1-2 tablets (5-10 mg total) by mouth every 3 (three) hours as needed for breakthrough pain.   predniSONE 10 MG tablet Commonly known as:  DELTASONE Label  & dispense according to the schedule below. 5 Pills PO for 1 day then, 4 Pills PO for 1 day, 3 Pills PO for 1 day, 2 Pills PO for 1 day, 1 Pill PO for 1 days then STOP.   albuterol (2.5 MG/3ML) 0.083% nebulizer solution Commonly known as:  PROVENTIL Take 2.5 mg by nebulization every 6 (six) hours as needed for wheezing or shortness of breath.   PROAIR HFA 108 (90 Base) MCG/ACT inhaler Generic drug:  albuterol Inhale 2 puffs into the lungs every 6 (six) hours as needed for wheezing or shortness of breath.   albuterol 108 (90 Base) MCG/ACT inhaler Commonly known as:  PROVENTIL HFA;VENTOLIN HFA Inhale 2 puffs into the lungs 4 (four) times daily as needed for wheezing.   vancomycin 1-5 GM/200ML-% Soln Commonly known as:  VANCOCIN Inject 200 mLs (1,000 mg total) into the vein daily.   Vitamin D (Ergocalciferol) 50000 units Caps capsule Commonly known as:  DRISDOL Take 1 capsule by mouth every 7 (seven) days.       Diagnostic Studies: Dg Knee 1-2 Views Left  Result Date: 08/15/2016 CLINICAL DATA:  Revision of ORIF of the patella. Reported fluoro time is 2 minutes 27 seconds EXAM: LEFT KNEE - 1-2 VIEW; DG C-ARM 61-120 MIN COMPARISON:  Intraoperative fluoro spot images from a procedure on the knee dated July 30, 2016 FINDINGS: A single fluoro spot images  submitted. There are 3 screws with a connected wire present within the body of the patella. Two hemostats are visible. IMPRESSION: The patient has undergone screw and wire fixation revision of a previously fractured patella. Electronically Signed   By: David  Martinique M.D.   On: 08/15/2016 14:03   Dg Knee Left Port  Result Date: 08/15/2016 CLINICAL DATA:  82 year old male with a history of postop left patella ORIF EXAM: PORTABLE LEFT KNEE - 1-2 VIEW COMPARISON:  08/15/2016, 07/30/2016, 07/30/2016 FINDINGS: Overlying bracing material somewhat obscures the details. Surgical changes of open reduction internal fixation of left patellar fracture. Interval placement of 3 interference screws with new cerclage wire fixation noted. Interval  removal of the prior K-wires and cerclage fixation. Surgical staples project over the soft tissues the anterior midline. Joint effusion present. The fracture line is still noted at the patella, similar appearance to comparison. IMPRESSION: Interval removal of cerclage wire and K-wire fixation, with placement of 3 interference screws and new cerclage fixation of known patellar fracture. Soft tissue swelling and surgical staples. Signed, Dulcy Fanny. Earleen Newport, DO [facility] Radiology Electronically Signed   By: Corrie Mckusick D.O.   On: 08/15/2016 15:07   Dg C-arm 61-120 Min  Result Date: 08/15/2016 CLINICAL DATA:  Revision of ORIF of the patella. Reported fluoro time is 2 minutes 27 seconds EXAM: LEFT KNEE - 1-2 VIEW; DG C-ARM 61-120 MIN COMPARISON:  Intraoperative fluoro spot images from a procedure on the knee dated July 30, 2016 FINDINGS: A single fluoro spot images submitted. There are 3 screws with a connected wire present within the body of the patella. Two hemostats are visible. IMPRESSION: The patient has undergone screw and wire fixation revision of a previously fractured patella. Electronically Signed   By: David  Martinique M.D.   On:  08/15/2016 14:03    Disposition: 03-Skilled Nursing Facility     Contact information for follow-up providers    MENZ,MICHAEL, MD Follow up in 2 week(s).   Specialty:  Orthopedic Surgery Contact information: [place] [address] [phone]            Contact information for after-discharge care    [facility] .   Specialty:  Skilled Nursing Facility Contact information: [place] [phone]                   Signed: [name] 09/02/2016, 8:17 AM

## 2016-09-05 LAB — AEROBIC/ANAEROBIC CULTURE W GRAM STAIN (SURGICAL/DEEP WOUND)

## 2016-09-05 LAB — AEROBIC/ANAEROBIC CULTURE (SURGICAL/DEEP WOUND)

## 2016-09-17 ENCOUNTER — Emergency Department
Admission: EM | Admit: 2016-09-17 | Discharge: 2016-09-17 | Disposition: A | Discharge status: Home / Self Care | Payer: Medicare Other | Attending: Emergency Medicine | Admitting: Emergency Medicine

## 2016-09-17 ENCOUNTER — Encounter: Payer: Self-pay | Admitting: Emergency Medicine

## 2016-09-17 ENCOUNTER — Emergency Department: Payer: Medicare Other

## 2016-09-17 DIAGNOSIS — Z79899 Other long term (current) drug therapy: Secondary | ICD-10-CM | POA: Diagnosis not present

## 2016-09-17 DIAGNOSIS — Z7952 Long term (current) use of systemic steroids: Secondary | ICD-10-CM | POA: Insufficient documentation

## 2016-09-17 DIAGNOSIS — Z792 Long term (current) use of antibiotics: Secondary | ICD-10-CM | POA: Insufficient documentation

## 2016-09-17 DIAGNOSIS — Z85828 Personal history of other malignant neoplasm of skin: Secondary | ICD-10-CM | POA: Insufficient documentation

## 2016-09-17 DIAGNOSIS — K5900 Constipation, unspecified: Secondary | ICD-10-CM | POA: Diagnosis not present

## 2016-09-17 DIAGNOSIS — J441 Chronic obstructive pulmonary disease with (acute) exacerbation: Secondary | ICD-10-CM | POA: Diagnosis not present

## 2016-09-17 DIAGNOSIS — E039 Hypothyroidism, unspecified: Secondary | ICD-10-CM | POA: Diagnosis not present

## 2016-09-17 DIAGNOSIS — J45909 Unspecified asthma, uncomplicated: Secondary | ICD-10-CM | POA: Diagnosis not present

## 2016-09-17 DIAGNOSIS — Z791 Long term (current) use of non-steroidal anti-inflammatories (NSAID): Secondary | ICD-10-CM | POA: Insufficient documentation

## 2016-09-17 DIAGNOSIS — I2510 Atherosclerotic heart disease of native coronary artery without angina pectoris: Secondary | ICD-10-CM | POA: Diagnosis not present

## 2016-09-17 DIAGNOSIS — R339 Retention of urine, unspecified: Secondary | ICD-10-CM | POA: Diagnosis not present

## 2016-09-17 DIAGNOSIS — Z7982 Long term (current) use of aspirin: Secondary | ICD-10-CM | POA: Insufficient documentation

## 2016-09-17 LAB — BASIC METABOLIC PANEL
ANION GAP: 6 (ref 5–15)
BUN: 20 mg/dL (ref 6–20)
CALCIUM: 8.9 mg/dL (ref 8.9–10.3)
CHLORIDE: 98 mmol/L — AB (ref 101–111)
CO2: 32 mmol/L (ref 22–32)
Creatinine, Ser: 1 mg/dL (ref 0.61–1.24)
GFR calc non Af Amer: >60 mL/min (ref >60)
Glucose, Bld: 126 mg/dL — ABNORMAL HIGH (ref 65–99)
Potassium: 4.6 mmol/L (ref 3.5–5.1)
Sodium: 136 mmol/L (ref 135–145)

## 2016-09-17 LAB — CBC WITH DIFFERENTIAL/PLATELET
BASOS ABS: 0.1 10*3/uL (ref 0–0.1)
BASOS PCT: 1 %
Eosinophils Absolute: 0.4 10*3/uL (ref 0–0.7)
Eosinophils Relative: 4 %
HEMATOCRIT: 33.7 % — AB (ref 40.0–52.0)
HEMOGLOBIN: 11.2 g/dL — AB (ref 13.0–18.0)
Lymphocytes Relative: 7 %
Lymphs Abs: 0.7 10*3/uL — ABNORMAL LOW (ref 1.0–3.6)
MCH: 31.4 pg (ref 26.0–34.0)
MCHC: 33.3 g/dL (ref 32.0–36.0)
MCV: 94.5 fL (ref 80.0–100.0)
Monocytes Absolute: 0.7 10*3/uL (ref 0.2–1.0)
Monocytes Relative: 7 %
NEUTROS ABS: 8 10*3/uL — AB (ref 1.4–6.5)
NEUTROS PCT: 81 %
Platelets: 403 10*3/uL (ref 150–440)
RBC: 3.56 MIL/uL — AB (ref 4.40–5.90)
RDW: 14.8 % — ABNORMAL HIGH (ref 11.5–14.5)
WBC: 9.8 10*3/uL (ref 3.8–10.6)

## 2016-09-17 LAB — URINALYSIS COMPLETE WITH MICROSCOPIC (ARMC ONLY)
BACTERIA UA: NONE SEEN
BILIRUBIN URINE: NEGATIVE
Glucose, UA: NEGATIVE mg/dL
HGB URINE DIPSTICK: NEGATIVE
Ketones, ur: NEGATIVE mg/dL
LEUKOCYTES UA: NEGATIVE
NITRITE: NEGATIVE
PH: 6 (ref 5.0–8.0)
Protein, ur: NEGATIVE mg/dL
Specific Gravity, Urine: 1.014 (ref 1.005–1.030)
Squamous Epithelial / LPF: NONE SEEN

## 2016-09-17 MED ORDER — CEPHALEXIN 500 MG PO CAPS: 500.0000 mg | ORAL_CAPSULE | Freq: Three times a day (TID) | ORAL | 0 refills | Status: DC

## 2016-09-17 MED ORDER — LACTULOSE 10 GM/15ML PO SOLN: 30.0000 g | Freq: Every day | ORAL | 0 refills | Status: AC | PRN

## 2016-09-17 MED ORDER — LACTULOSE 10 GM/15ML PO SOLN
30.0000 g | Freq: Once | ORAL | Status: AC
  Administered 2016-09-17: 30 g via ORAL
  Filled 2016-09-17: qty 60

## 2016-09-17 NOTE — ED Notes (Signed)
Report called to [place] at [facility] health care.

## 2016-09-17 NOTE — ED Notes (Signed)
Pt resting in bed, eyes closed, resp even and unlabored, pt appears in no acute distress

## 2016-09-17 NOTE — ED Notes (Signed)
Kurt Hill told me that EMS told her it may be 8am before patient is picked up.

## 2016-09-17 NOTE — ED Notes (Signed)
[employer] - patient thought they offered a shuttle service that would get him home but after talking to [place] at the facility this is a service for doctor appointments only and has to be scheduled in advance.  Patient must be taken back by EMS.

## 2016-09-17 NOTE — ED Provider Notes (Addendum)
[facility] Emergency Department Provider Note   ____________________________________________   First MD Initiated Contact with Patient 09/17/16 [phone]     (approximate)  I have reviewed the triage vital signs and the nursing notes.   HISTORY  Chief Complaint Urinary Retention    HPI Kurt Hill is a 82 y.o. male sent to the ED from [facility] health care with a chief complaint of urinary retention. Patient had knee surgery in August with wound VAC in place; states he has been constipated for the past 5 days. Reports he has been unable to urinate since 12:30 yesterday afternoon. Planes of suprapubic abdominal pain and urinary retention. States he has had similar issues before but he was always able to walk around and urinate thereafter. Since he has been in a knee brace with decreased mobility, he was unable to walk around yesterday to alleviate his symptoms. Denies associated fever, chills, chest pain, shortness of breath, nausea, vomiting, diarrhea. Denies recent travel or trauma. Nothing makes his symptoms better or worse.   Past Medical History:  Diagnosis Date  . Anemia   . Asthma   . BPH (benign prostatic hyperplasia)   . COPD (chronic obstructive pulmonary disease) (HCC)    not on home oxygen  . Coronary artery disease   . Hypotension   . Hypothyroidism   . Skin abnormalities   . Skin cancer     Patient Active Problem List   Diagnosis Date Noted  . Cellulitis of left leg 08/30/2016  . Post op infection 08/30/2016  . Cellulitis 08/30/2016  . Left patella fracture 08/15/2016  . Patella fracture 07/30/2016  . COPD exacerbation ([place]) 12/14/2015    Past Surgical History:  Procedure Laterality Date  . APPENDECTOMY    . BONE RESECTION     on nose  . MOHS SURGERY    . ORIF PATELLA Left 07/30/2016   Procedure: OPEN REDUCTION INTERNAL (ORIF) FIXATION PATELLA;  Surgeon: Hessie Knows, MD;  Location: [facility] ORS;  Service: Orthopedics;  Laterality:  Left;  . ORIF PATELLA Left 08/15/2016   Procedure: OPEN REDUCTION INTERNAL (ORIF) FIXATION PATELLA;  Surgeon: Hessie Knows, MD;  Location: [facility] ORS;  Service: Orthopedics;  Laterality: Left;  . TONSILLECTOMY      Prior to Admission medications   Medication Sig Start Date End Date Taking? Authorizing Provider  albuterol (PROAIR HFA) 108 (90 Base) MCG/ACT inhaler Inhale 2 puffs into the lungs every 6 (six) hours as needed for wheezing or shortness of breath.  12/06/15 12/05/16  Historical Provider, MD  albuterol (PROVENTIL HFA;VENTOLIN HFA) 108 (90 Base) MCG/ACT inhaler Inhale 2 puffs into the lungs 4 (four) times daily as needed for wheezing. 06/20/16 06/20/17  Historical Provider, MD  albuterol (PROVENTIL) (2.5 MG/3ML) 0.083% nebulizer solution Take 2.5 mg by nebulization every 6 (six) hours as needed for wheezing or shortness of breath.    Historical Provider, MD  aspirin EC 325 MG tablet Take 1 tablet (325 mg total) by mouth daily. Hold this prescription until the Lovenox is complete in 2 weeks. 08/17/16   Reche Dixon, PA-C  aspirin EC 81 MG tablet Take 81 mg by mouth every morning.    Historical Provider, MD  azithromycin (ZITHROMAX) 500 MG tablet Take 0.5 tablets (250 mg total) by mouth daily. Patient not taking: Reported on 01/11/2016 12/15/15   Henreitta Leber, MD  budesonide-formoterol [facility]) 80-4.5 MCG/ACT inhaler Inhale 2 puffs into the lungs 2 (two) times daily. 12/15/15   Henreitta Leber, MD  budesonide-formoterol (  SYMBICORT) 80-4.5 MCG/ACT inhaler Inhale 2 puffs into the lungs 2 (two) times daily.    Historical Provider, MD  cephALEXin (KEFLEX) 500 MG capsule Take 1 capsule (500 mg total) by mouth 3 (three) times daily. 09/17/16   Paulette Blanch, MD  finasteride (PROSCAR) 5 MG tablet Take 5 mg by mouth daily.    Historical Provider, MD  finasteride (PROSCAR) 5 MG tablet Take 5 mg by mouth every morning. 03/12/16 03/12/17  Historical Provider, MD  HYDROcodone-acetaminophen (NORCO/VICODIN) 5-325 MG  tablet Take 1-2 tablets by mouth every 4 (four) hours as needed (breakthrough pain). 08/17/16   Reche Dixon, PA-C  lactulose (CHRONULAC) 10 GM/15ML solution Take 45 mLs (30 g total) by mouth daily as needed for moderate constipation. 09/17/16   Paulette Blanch, MD  levothyroxine (SYNTHROID, LEVOTHROID) 100 MCG tablet Take 1 tablet by mouth daily. 09/04/15   Historical Provider, MD  mupirocin ointment (BACTROBAN) 2 % Place 1 application into the nose 2 (two) times daily. 09/02/16   Duanne Guess, PA-C  oxyCODONE (OXY IR/ROXICODONE) 5 MG immediate release tablet Take 1-2 tablets (5-10 mg total) by mouth every 3 (three) hours as needed for breakthrough pain. 09/02/16   Duanne Guess, PA-C  predniSONE (DELTASONE) 10 MG tablet Label  & dispense according to the schedule below. 5 Pills PO for 1 day then, 4 Pills PO for 1 day, 3 Pills PO for 1 day, 2 Pills PO for 1 day, 1 Pill PO for 1 days then STOP. Patient not taking: Reported on 01/11/2016 12/15/15   Henreitta Leber, MD  Vitamin D, Ergocalciferol, (DRISDOL) 50000 units CAPS capsule Take 1 capsule by mouth every 7 (seven) days. 09/04/15   Historical Provider, MD    Allergies Ivp dye [iodinated diagnostic agents] and Sulfa antibiotics  History reviewed. No pertinent family history.  Social History Social History  Substance Use Topics  . Smoking status: Never Smoker  . Smokeless tobacco: Never Used     Comment: tried cigarettes when young- but never really smoked  . Alcohol use No    Review of Systems  Constitutional: No fever/chills. Eyes: No visual changes. ENT: No sore throat. Cardiovascular: Denies chest pain. Respiratory: Denies shortness of breath. Gastrointestinal: No abdominal pain.  No nausea, no vomiting.  No diarrhea.  No constipation. Genitourinary: Positive for urinary retention. Negative for dysuria. Musculoskeletal: Negative for back pain. Skin: Negative for rash. Neurological: Negative for headaches, focal weakness or  numbness.  10-point ROS otherwise negative.  ____________________________________________   PHYSICAL EXAM:  VITAL SIGNS: ED Triage Vitals  Enc Vitals Group     BP 09/17/16 0407 117/69     Pulse Rate 09/17/16 0407 84     Resp 09/17/16 0407 16     Temp 09/17/16 0407 98.2 F (36.8 C)     Temp Source 09/17/16 0407 Oral     SpO2 09/17/16 0407 100 %     Weight 09/17/16 0408 153 lb 12.8 oz (69.8 kg)     Height --      Head Circumference --      Peak Flow --      Pain Score --      Pain Loc --      Pain Edu? --      Excl. in [place]? --    Examined after Foley placement: Constitutional: Alert and oriented. Frail appearing and in no acute distress. Eyes: Conjunctivae are normal. PERRL. EOMI. Head: Atraumatic. Nose: No congestion/rhinnorhea. Mouth/Throat: Mucous membranes are moist.  Oropharynx  non-erythematous. Neck: No stridor.   Cardiovascular: Normal rate, regular rhythm. Grossly normal heart sounds.  Good peripheral circulation. Respiratory: Normal respiratory effort.  No retractions. Lungs CTAB. Gastrointestinal: Soft and nontender to light or deep palpation. No distention. No abdominal bruits. No CVA tenderness. Musculoskeletal: LLE in knee brace with wound VAC.  No joint effusions. Neurologic:  Normal speech and language. No gross focal neurologic deficits are appreciated.  Skin:  Skin is warm, dry and intact. No rash noted. Psychiatric: Mood and affect are normal. Speech and behavior are normal.  ____________________________________________   LABS (all labs ordered are listed, but only abnormal results are displayed)  Labs Reviewed  CBC WITH DIFFERENTIAL/PLATELET - Abnormal; Notable for the following:       Result Value   RBC 3.56 (*)    Hemoglobin 11.2 (*)    HCT 33.7 (*)    RDW 14.8 (*)    Neutro Abs 8.0 (*)    Lymphs Abs 0.7 (*)    All other components within normal limits  BASIC METABOLIC PANEL - Abnormal; Notable for the following:    Chloride 98 (*)     Glucose, Bld 126 (*)    All other components within normal limits  URINALYSIS COMPLETEWITH MICROSCOPIC ([facility] ONLY) - Abnormal; Notable for the following:    Color, Urine YELLOW (*)    APPearance CLEAR (*)    All other components within normal limits   ____________________________________________  EKG  None ____________________________________________  RADIOLOGY  KUB (viewed by me, interpreted per Dr. Marisue Humble): Large colonic stool burden without evidence of fecal impaction. ____________________________________________   PROCEDURES  Procedure(s) performed: None  Procedures  Critical Care performed: No  ____________________________________________   INITIAL IMPRESSION / ASSESSMENT AND PLAN / ED COURSE  Pertinent labs & imaging results that were available during my care of the patient were reviewed by me and considered in my medical decision making (see chart for details).  82 year old male who presents with urinary retention in the setting of constipation secondary to analgesia status post knee surgery. Bladder scan revealed 809 mL; Foley inserted with immediate relief of symptoms. Reportedly patient had not urinated since noon; will check renal function, urinalysis and obtain KUB.  Clinical Course  Comment By Time  Updated patient on laboratory and imaging results. Will leave Foley in place and refer patient to urology. Laxative prescribed for constipation. Strict return precautions given. Patient verbalizes understanding and agrees with plan of care. Paulette Blanch, MD 10/10 [phone]  Reviewed facility [facility]; patient already on Keflex for leg wound. Keflex should also prophylactically cover patient for UTI secondary to Foley catheter. Paulette Blanch, MD 10/10 [phone]     ____________________________________________   FINAL CLINICAL IMPRESSION(S) / ED DIAGNOSES  Final diagnoses:  Urinary retention  Constipation, unspecified constipation type      NEW MEDICATIONS STARTED  DURING THIS VISIT:  New Prescriptions   CEPHALEXIN (KEFLEX) 500 MG CAPSULE    Take 1 capsule (500 mg total) by mouth 3 (three) times daily.   LACTULOSE (CHRONULAC) 10 GM/15ML SOLUTION    Take 45 mLs (30 g total) by mouth daily as needed for moderate constipation.     Note:  This document was prepared using Dragon voice recognition software and may include unintentional dictation errors.    Paulette Blanch, MD 09/17/16 [id number]    Paulette Blanch, MD 09/17/16 [phone]

## 2016-09-17 NOTE — ED Notes (Signed)
XR at bedside

## 2016-09-17 NOTE — ED Notes (Signed)
EMS at bedside for transfer

## 2016-09-17 NOTE — ED Notes (Signed)
Attempted to call report to [facility], no answer. 

## 2016-09-17 NOTE — Discharge Instructions (Signed)
1. Keep Foley catheter in place until you are seen by the urologist. 2. Take antibiotic to prevent urinary tract infection (Keflex 500 mg 3 times daily 7 days). 3. Take lactulose daily as needed to have bowel movements. 4. Drink plenty of fluids daily. 5. Return to the ER for worsening symptoms, persistent vomiting, difficulty breathing or other concerns.

## 2016-09-17 NOTE — ED Notes (Signed)
Pt waiting on transport back to [facility]

## 2016-09-17 NOTE — ED Notes (Signed)
Keep foley catheter in place per MD verbal order.

## 2016-09-17 NOTE — ED Notes (Signed)
Patient had bowel movement and stated he needed to be changed. I cleaned up patient and put on new diaper for him

## 2016-09-17 NOTE — ED Triage Notes (Addendum)
Per EMS patient c/o inability to urinate, although [place] says he urinates at 1230 pm yesterday.  EMS reports patient had a lot of negative comments about the way he is cared for there.  Pt states he takes medicine for his prostate but it is not listed on his MAR from the facility.  He has a left knee brace on from where he broke his patella back in August.  VS are WNL during triage.  Patient does have negative pressure therapy active from surgery on his left patella.

## 2016-09-17 NOTE — ED Notes (Addendum)
Attempted to call report to  health care with no answer when transferred.

## 2016-10-02 ENCOUNTER — Ambulatory Visit: Payer: Self-pay

## 2016-10-13 ENCOUNTER — Emergency Department: Payer: Medicare Other

## 2016-10-13 ENCOUNTER — Inpatient Hospital Stay
Admission: EM | Admit: 2016-10-13 | Discharge: 2016-10-21 | DRG: 908 | Disposition: A | Discharge status: Skilled Nursing Facility | Payer: Medicare Other | Attending: Internal Medicine | Admitting: Internal Medicine

## 2016-10-13 ENCOUNTER — Encounter
Admission: EM | Disposition: A | Discharge status: Skilled Nursing Facility | Payer: Self-pay | Source: Home / Self Care | Attending: Internal Medicine

## 2016-10-13 ENCOUNTER — Encounter: Payer: Self-pay | Admitting: Emergency Medicine

## 2016-10-13 DIAGNOSIS — Z7951 Long term (current) use of inhaled steroids: Secondary | ICD-10-CM | POA: Diagnosis not present

## 2016-10-13 DIAGNOSIS — R0902 Hypoxemia: Secondary | ICD-10-CM | POA: Diagnosis not present

## 2016-10-13 DIAGNOSIS — Z7952 Long term (current) use of systemic steroids: Secondary | ICD-10-CM

## 2016-10-13 DIAGNOSIS — Z7982 Long term (current) use of aspirin: Secondary | ICD-10-CM | POA: Diagnosis not present

## 2016-10-13 DIAGNOSIS — S0101XA Laceration without foreign body of scalp, initial encounter: Secondary | ICD-10-CM | POA: Diagnosis present

## 2016-10-13 DIAGNOSIS — S01312A Laceration without foreign body of left ear, initial encounter: Secondary | ICD-10-CM

## 2016-10-13 DIAGNOSIS — Z91041 Radiographic dye allergy status: Secondary | ICD-10-CM | POA: Diagnosis not present

## 2016-10-13 DIAGNOSIS — E039 Hypothyroidism, unspecified: Secondary | ICD-10-CM | POA: Diagnosis present

## 2016-10-13 DIAGNOSIS — L8990 Pressure ulcer of unspecified site, unspecified stage: Secondary | ICD-10-CM | POA: Insufficient documentation

## 2016-10-13 DIAGNOSIS — W010XXA Fall on same level from slipping, tripping and stumbling without subsequent striking against object, initial encounter: Secondary | ICD-10-CM | POA: Diagnosis present

## 2016-10-13 DIAGNOSIS — Z882 Allergy status to sulfonamides status: Secondary | ICD-10-CM

## 2016-10-13 DIAGNOSIS — D62 Acute posthemorrhagic anemia: Secondary | ICD-10-CM | POA: Diagnosis not present

## 2016-10-13 DIAGNOSIS — L03116 Cellulitis of left lower limb: Secondary | ICD-10-CM | POA: Diagnosis present

## 2016-10-13 DIAGNOSIS — S82002A Unspecified fracture of left patella, initial encounter for closed fracture: Secondary | ICD-10-CM | POA: Diagnosis present

## 2016-10-13 DIAGNOSIS — L89620 Pressure ulcer of left heel, unstageable: Secondary | ICD-10-CM | POA: Diagnosis present

## 2016-10-13 DIAGNOSIS — I2510 Atherosclerotic heart disease of native coronary artery without angina pectoris: Secondary | ICD-10-CM | POA: Diagnosis present

## 2016-10-13 DIAGNOSIS — T8132XA Disruption of internal operation (surgical) wound, not elsewhere classified, initial encounter: Secondary | ICD-10-CM | POA: Diagnosis present

## 2016-10-13 DIAGNOSIS — Z79899 Other long term (current) drug therapy: Secondary | ICD-10-CM | POA: Diagnosis not present

## 2016-10-13 DIAGNOSIS — T814XXA Infection following a procedure, initial encounter: Secondary | ICD-10-CM | POA: Diagnosis present

## 2016-10-13 DIAGNOSIS — J45909 Unspecified asthma, uncomplicated: Secondary | ICD-10-CM | POA: Diagnosis present

## 2016-10-13 DIAGNOSIS — N400 Enlarged prostate without lower urinary tract symptoms: Secondary | ICD-10-CM | POA: Diagnosis present

## 2016-10-13 DIAGNOSIS — Z79891 Long term (current) use of opiate analgesic: Secondary | ICD-10-CM | POA: Diagnosis not present

## 2016-10-13 DIAGNOSIS — Z419 Encounter for procedure for purposes other than remedying health state, unspecified: Secondary | ICD-10-CM

## 2016-10-13 DIAGNOSIS — J449 Chronic obstructive pulmonary disease, unspecified: Secondary | ICD-10-CM | POA: Diagnosis present

## 2016-10-13 DIAGNOSIS — R262 Difficulty in walking, not elsewhere classified: Secondary | ICD-10-CM

## 2016-10-13 DIAGNOSIS — I959 Hypotension, unspecified: Secondary | ICD-10-CM | POA: Diagnosis not present

## 2016-10-13 DIAGNOSIS — M6281 Muscle weakness (generalized): Secondary | ICD-10-CM

## 2016-10-13 DIAGNOSIS — L0390 Cellulitis, unspecified: Secondary | ICD-10-CM | POA: Diagnosis present

## 2016-10-13 DIAGNOSIS — K5900 Constipation, unspecified: Secondary | ICD-10-CM | POA: Diagnosis present

## 2016-10-13 DIAGNOSIS — W19XXXA Unspecified fall, initial encounter: Secondary | ICD-10-CM

## 2016-10-13 LAB — BASIC METABOLIC PANEL
ANION GAP: 9 (ref 5–15)
BUN: 23 mg/dL — ABNORMAL HIGH (ref 6–20)
CHLORIDE: 96 mmol/L — AB (ref 101–111)
CO2: 29 mmol/L (ref 22–32)
CREATININE: 1.2 mg/dL (ref 0.61–1.24)
Calcium: 9.1 mg/dL (ref 8.9–10.3)
GFR calc non Af Amer: 54 mL/min — ABNORMAL LOW (ref >60)
Glucose, Bld: 138 mg/dL — ABNORMAL HIGH (ref 65–99)
POTASSIUM: 4.3 mmol/L (ref 3.5–5.1)
SODIUM: 134 mmol/L — AB (ref 135–145)

## 2016-10-13 LAB — CBC WITH DIFFERENTIAL/PLATELET
Basophils Absolute: 0 10*3/uL (ref 0–0.1)
Basophils Relative: 1 %
EOS ABS: 0.2 10*3/uL (ref 0–0.7)
Eosinophils Relative: 3 %
HEMATOCRIT: 32 % — AB (ref 40.0–52.0)
HEMOGLOBIN: 10.8 g/dL — AB (ref 13.0–18.0)
LYMPHS ABS: 0.8 10*3/uL — AB (ref 1.0–3.6)
LYMPHS PCT: 10 %
MCH: 31 pg (ref 26.0–34.0)
MCHC: 33.8 g/dL (ref 32.0–36.0)
MCV: 91.8 fL (ref 80.0–100.0)
MONOS PCT: 8 %
Monocytes Absolute: 0.7 10*3/uL (ref 0.2–1.0)
NEUTROS ABS: 6.8 10*3/uL — AB (ref 1.4–6.5)
NEUTROS PCT: 80 %
Platelets: 318 10*3/uL (ref 150–440)
RBC: 3.49 MIL/uL — AB (ref 4.40–5.90)
RDW: 14.7 % — ABNORMAL HIGH (ref 11.5–14.5)
WBC: 8.5 10*3/uL (ref 3.8–10.6)

## 2016-10-13 LAB — TSH: TSH: 28.442 u[IU]/mL — ABNORMAL HIGH (ref 0.350–4.500)

## 2016-10-13 LAB — SURGICAL PCR SCREEN
MRSA, PCR: POSITIVE — AB
Staphylococcus aureus: POSITIVE — AB

## 2016-10-13 LAB — C-REACTIVE PROTEIN: CRP: 11.1 mg/dL — AB (ref <1.0)

## 2016-10-13 LAB — SEDIMENTATION RATE: Sed Rate: 91 mm/hr — ABNORMAL HIGH (ref 0–20)

## 2016-10-13 SURGERY — IRRIGATION AND DEBRIDEMENT KNEE
Anesthesia: General | Laterality: Left

## 2016-10-13 MED ORDER — ALBUTEROL SULFATE (2.5 MG/3ML) 0.083% IN NEBU: 2.5000 mg | INHALATION_SOLUTION | Freq: Four times a day (QID) | RESPIRATORY_TRACT | Status: DC | PRN

## 2016-10-13 MED ORDER — OXYCODONE HCL 5 MG PO TABS
5.0000 mg | ORAL_TABLET | ORAL | Status: DC | PRN
  Administered 2016-10-13: 5 mg via ORAL
  Administered 2016-10-14 – 2016-10-15 (×4): 10 mg via ORAL
  Administered 2016-10-15 – 2016-10-16 (×3): 5 mg via ORAL
  Administered 2016-10-17: 10 mg via ORAL
  Administered 2016-10-17: 5 mg via ORAL
  Administered 2016-10-18 (×2): 10 mg via ORAL
  Administered 2016-10-19: 5 mg via ORAL
  Administered 2016-10-19 – 2016-10-20 (×2): 10 mg via ORAL
  Administered 2016-10-20: 5 mg via ORAL
  Administered 2016-10-20 – 2016-10-21 (×2): 10 mg via ORAL
  Filled 2016-10-13 (×3): qty 2
  Filled 2016-10-13: qty 1
  Filled 2016-10-13 (×4): qty 2
  Filled 2016-10-13: qty 1
  Filled 2016-10-13: qty 2
  Filled 2016-10-13: qty 1
  Filled 2016-10-13: qty 2
  Filled 2016-10-13: qty 1
  Filled 2016-10-13: qty 2
  Filled 2016-10-13: qty 1
  Filled 2016-10-13: qty 2
  Filled 2016-10-13: qty 1

## 2016-10-13 MED ORDER — ACETAMINOPHEN 325 MG PO TABS
650.0000 mg | ORAL_TABLET | Freq: Four times a day (QID) | ORAL | Status: DC | PRN
  Administered 2016-10-13: 650 mg via ORAL
  Filled 2016-10-13 (×2): qty 2

## 2016-10-13 MED ORDER — VANCOMYCIN HCL IN DEXTROSE 1-5 GM/200ML-% IV SOLN
1000.0000 mg | Freq: Once | INTRAVENOUS | Status: AC
  Administered 2016-10-13: 1000 mg via INTRAVENOUS
  Filled 2016-10-13: qty 200

## 2016-10-13 MED ORDER — SODIUM CHLORIDE 0.9 % IV SOLN
INTRAVENOUS | Status: AC
  Administered 2016-10-13 – 2016-10-14 (×2): via INTRAVENOUS

## 2016-10-13 MED ORDER — ONDANSETRON HCL 4 MG/2ML IJ SOLN
INTRAMUSCULAR | Status: AC
  Filled 2016-10-13: qty 2

## 2016-10-13 MED ORDER — ONDANSETRON HCL 4 MG/2ML IJ SOLN
4.0000 mg | Freq: Four times a day (QID) | INTRAMUSCULAR | Status: DC | PRN
  Administered 2016-10-15: 4 mg via INTRAVENOUS
  Filled 2016-10-13: qty 2

## 2016-10-13 MED ORDER — ALBUTEROL SULFATE (2.5 MG/3ML) 0.083% IN NEBU
2.5000 mg | INHALATION_SOLUTION | RESPIRATORY_TRACT | Status: DC | PRN
  Administered 2016-10-16: 2.5 mg via RESPIRATORY_TRACT
  Filled 2016-10-13: qty 3

## 2016-10-13 MED ORDER — VANCOMYCIN HCL IN DEXTROSE 1-5 GM/200ML-% IV SOLN
1000.0000 mg | INTRAVENOUS | Status: DC
  Administered 2016-10-13 – 2016-10-18 (×8): 1000 mg via INTRAVENOUS
  Filled 2016-10-13 (×9): qty 200

## 2016-10-13 MED ORDER — ACETAMINOPHEN 650 MG RE SUPP: 650.0000 mg | Freq: Four times a day (QID) | RECTAL | Status: DC | PRN

## 2016-10-13 MED ORDER — VITAMIN D (ERGOCALCIFEROL) 1.25 MG (50000 UNIT) PO CAPS
50000.0000 [IU] | ORAL_CAPSULE | ORAL | Status: DC
  Administered 2016-10-13: 50000 [IU] via ORAL
  Filled 2016-10-13 (×2): qty 1

## 2016-10-13 MED ORDER — ASPIRIN EC 81 MG PO TBEC
81.0000 mg | DELAYED_RELEASE_TABLET | Freq: Every morning | ORAL | Status: DC
  Administered 2016-10-13 – 2016-10-21 (×8): 81 mg via ORAL
  Filled 2016-10-13 (×8): qty 1

## 2016-10-13 MED ORDER — LEVOTHYROXINE SODIUM 50 MCG PO TABS: 100.0000 ug | ORAL_TABLET | Freq: Every day | ORAL | Status: DC

## 2016-10-13 MED ORDER — ONDANSETRON HCL 4 MG PO TABS: 4.0000 mg | ORAL_TABLET | Freq: Four times a day (QID) | ORAL | Status: DC | PRN

## 2016-10-13 MED ORDER — LIDOCAINE-EPINEPHRINE (PF) 1 %-1:200000 IJ SOLN
INTRAMUSCULAR | Status: AC
  Filled 2016-10-13: qty 30

## 2016-10-13 MED ORDER — FINASTERIDE 5 MG PO TABS: 5.0000 mg | ORAL_TABLET | Freq: Every morning | ORAL | Status: DC

## 2016-10-13 MED ORDER — MUPIROCIN 2 % EX OINT
1.0000 "application " | TOPICAL_OINTMENT | Freq: Two times a day (BID) | CUTANEOUS | Status: DC
  Administered 2016-10-13 – 2016-10-21 (×17): 1 via NASAL
  Filled 2016-10-13: qty 22

## 2016-10-13 MED ORDER — MOMETASONE FURO-FORMOTEROL FUM 100-5 MCG/ACT IN AERO
2.0000 | INHALATION_SPRAY | Freq: Two times a day (BID) | RESPIRATORY_TRACT | Status: DC
  Administered 2016-10-13 – 2016-10-21 (×17): 2 via RESPIRATORY_TRACT
  Filled 2016-10-13: qty 8.8

## 2016-10-13 MED ORDER — MORPHINE SULFATE (PF) 2 MG/ML IV SOLN: 2.0000 mg | INTRAVENOUS | Status: DC | PRN

## 2016-10-13 MED ORDER — DOCUSATE SODIUM 100 MG PO CAPS
100.0000 mg | ORAL_CAPSULE | Freq: Two times a day (BID) | ORAL | Status: DC
  Administered 2016-10-13 – 2016-10-21 (×17): 100 mg via ORAL
  Filled 2016-10-13 (×16): qty 1

## 2016-10-13 MED ORDER — MORPHINE SULFATE (PF) 2 MG/ML IV SOLN
INTRAVENOUS | Status: AC
  Filled 2016-10-13: qty 2

## 2016-10-13 MED ORDER — MORPHINE SULFATE (PF) 2 MG/ML IV SOLN
4.0000 mg | Freq: Once | INTRAVENOUS | Status: AC
  Administered 2016-10-13: 4 mg via INTRAVENOUS
  Filled 2016-10-13: qty 2

## 2016-10-13 MED ORDER — FINASTERIDE 5 MG PO TABS
5.0000 mg | ORAL_TABLET | Freq: Every day | ORAL | Status: DC
  Administered 2016-10-13 – 2016-10-21 (×8): 5 mg via ORAL
  Filled 2016-10-13 (×8): qty 1

## 2016-10-13 MED ORDER — LEVOTHYROXINE SODIUM 75 MCG PO TABS
150.0000 ug | ORAL_TABLET | Freq: Every day | ORAL | Status: DC
  Administered 2016-10-13 – 2016-10-21 (×8): 150 ug via ORAL
  Filled 2016-10-13 (×9): qty 2

## 2016-10-13 MED ORDER — COLLAGENASE 250 UNIT/GM EX OINT
TOPICAL_OINTMENT | Freq: Every day | CUTANEOUS | Status: DC
  Administered 2016-10-13 – 2016-10-21 (×9): via TOPICAL
  Filled 2016-10-13: qty 30

## 2016-10-13 MED ORDER — LACTULOSE 10 GM/15ML PO SOLN
30.0000 g | Freq: Every day | ORAL | Status: DC | PRN
  Administered 2016-10-15 – 2016-10-16 (×2): 30 g via ORAL
  Filled 2016-10-13 (×3): qty 60

## 2016-10-13 MED ORDER — SODIUM CHLORIDE 0.9% FLUSH
3.0000 mL | Freq: Two times a day (BID) | INTRAVENOUS | Status: DC
Start: 2016-10-13 — End: 2016-10-21
  Administered 2016-10-15 – 2016-10-20 (×10): 3 mL via INTRAVENOUS

## 2016-10-13 MED ORDER — SODIUM CHLORIDE 0.9 % IV SOLN
INTRAVENOUS | Status: DC
  Administered 2016-10-13: 07:00:00 via INTRAVENOUS

## 2016-10-13 NOTE — ED Notes (Signed)
Report called to floor, given to [facility].

## 2016-10-13 NOTE — ED Notes (Signed)
Resting quietly.  PA student in suturing laceration.

## 2016-10-13 NOTE — Progress Notes (Signed)
Pharmacy Antibiotic Note  Kurt Hill is a 82 y.o. male admitted on 10/13/2016 with cellulitis.  Pharmacy has been consulted for vancomycin dosing.  Plan: Vancomycin 1 g IV every 18 hours.  Goal trough 10-15 mcg/mL.  Trough level ordered prior to 5th dose on 11/7 at 21:30.  Height: 5\' 10"  (177.8 cm) Weight: 155 lb 4 oz (70.4 kg) IBW/kg (Calculated) : 73  Temp (24hrs), Avg:98.7 F (37.1 C), Min:98.7 F (37.1 C), Max:98.7 F (37.1 C)   Recent Labs Lab 10/13/16 0142  WBC 8.5  CREATININE 1.20    Estimated Creatinine Clearance: 47.3 mL/min (by C-G formula based on SCr of 1.2 mg/dL).    Allergies  Allergen Reactions  . Ivp Dye [Iodinated Diagnostic Agents] Itching and Rash  . Sulfa Antibiotics Rash    Antimicrobials this admission: Vancomycin 11/5 >>     Dose adjustments this admission: N/A  Microbiology results: 11/5 BCx: pending  Thank you for allowing pharmacy to be a part of this patient's care.  Olivia Canter, RPh Clinical Pharmacist 10/13/2016 7:31 AM

## 2016-10-13 NOTE — Clinical Social Work Note (Signed)
CSW received consult to assess for needs at home. Patient is known to CSW from last admission. Patient refuses STR unless "doctors can guarantee that rehab won't happen until I am ready". Patient indicated that he would not return to [facility] for STR. At this time he has not given permission for a bed search until he consents to further surgery if needed. Patient indicated that he has no needs at home and reports that his family helps him as much as they can. CSW will con't to follow pending dc planning needs.  Santiago Bumpers, MSW, LCSW-A [phone]

## 2016-10-13 NOTE — Consult Note (Signed)
ORTHOPAEDIC CONSULTATION  REQUESTING PHYSICIAN: Hillary Bow, MD  Chief Complaint: Left knee pain status post fall  HPI: Kurt Hill is a 82 y.o. male who complains of  increased left knee pain status post fall at home last night. Patient lives with his son with his son is away on a trip according to the patient. Patient initially sustained a fracture to his left patella which was fixed by Dr. Rudene Christians on 07/30/16.  Patient had loss of fixation and was reoperated on to fix the patella on 08/15/2016.    Postoperatively the patient had a wound VAC placed and started on antibiotics for postop infection.  Past Medical History:  Diagnosis Date  . Anemia   . Asthma   . BPH (benign prostatic hyperplasia)   . COPD (chronic obstructive pulmonary disease) (HCC)    not on home oxygen  . Coronary artery disease   . Hypotension   . Hypothyroidism   . Skin abnormalities   . Skin cancer    Past Surgical History:  Procedure Laterality Date  . APPENDECTOMY    . BONE RESECTION     on nose  . MOHS SURGERY    . ORIF PATELLA Left 07/30/2016   Procedure: OPEN REDUCTION INTERNAL (ORIF) FIXATION PATELLA;  Surgeon: Hessie Knows, MD;  Location: [facility] OR[facility];  Service: Orthopedics;  Laterality: Left;  . ORIF PATELLA Left 08/15/2016   Procedure: OPEN REDUCTION INTERNAL (ORIF) FIXATION PATELLA;  Surgeon: Hessie Knows, MD;  Location: [facility];  Service: Orthopedics;  Laterality: Left;  . TONSILLECTOMY     Social History   Social History  . Marital status: Widowed    Spouse name: N/A  . Number of children: N/A  . Years of education: N/A   Social History Main Topics  . Smoking status: Never Smoker  . Smokeless tobacco: Never Used     Comment: tried cigarettes when young- but never really smoked  . Alcohol use No  . Drug use: No  . Sexual activity: Not Asked   Other Topics Concern  . None   Social History Narrative   Lives at home with son. Independent at baseline.   History reviewed. No pertinent  family history. Allergies  Allergen Reactions  . Ivp Dye [Iodinated Diagnostic Agents] Itching and Rash  . Sulfa Antibiotics Rash   Prior to Admission medications   Medication Sig Start Date End Date Taking? Authorizing Provider  albuterol (PROAIR HFA) 108 (90 Base) MCG/ACT inhaler Inhale 2 puffs into the lungs every 6 (six) hours as needed for wheezing or shortness of breath.  12/06/15 12/05/16 Yes Historical Provider, MD  albuterol (PROVENTIL HFA;VENTOLIN HFA) 108 (90 Base) MCG/ACT inhaler Inhale 2 puffs into the lungs 4 (four) times daily as needed for wheezing. 06/20/16 06/20/17 Yes Historical Provider, MD  finasteride (PROSCAR) 5 MG tablet Take 5 mg by mouth every morning. 03/12/16 03/12/17 Yes Historical Provider, MD  HYDROcodone-acetaminophen (NORCO/VICODIN) 5-325 MG tablet Take 1-2 tablets by mouth every 4 (four) hours as needed (breakthrough pain). 08/17/16  Yes Reche Dixon, PA-C  levothyroxine (SYNTHROID, LEVOTHROID) 100 MCG tablet Take 1 tablet by mouth daily. 09/04/15  Yes Historical Provider, MD  Vitamin D, Ergocalciferol, (DRISDOL) 50000 units CAPS capsule Take 1 capsule by mouth every 7 (seven) days. 09/04/15  Yes Historical Provider, MD  albuterol (PROVENTIL) (2.5 MG/3ML) 0.083% nebulizer solution Take 2.5 mg by nebulization every 6 (six) hours as needed for wheezing or shortness of breath.    Historical Provider, MD  aspirin EC 325 MG tablet  Take 1 tablet (325 mg total) by mouth daily. Hold this prescription until the Lovenox is complete in 2 weeks. 08/17/16   Reche Dixon, PA-C  aspirin EC 81 MG tablet Take 81 mg by mouth every morning.    Historical Provider, MD  azithromycin (ZITHROMAX) 500 MG tablet Take 0.5 tablets (250 mg total) by mouth daily. Patient not taking: Reported on 01/11/2016 12/15/15   Henreitta Leber, MD  budesonide-formoterol [facility]) 80-4.5 MCG/ACT inhaler Inhale 2 puffs into the lungs 2 (two) times daily. 12/15/15   Henreitta Leber, MD  budesonide-formoterol (SYMBICORT)  80-4.5 MCG/ACT inhaler Inhale 2 puffs into the lungs 2 (two) times daily.    Historical Provider, MD  cephALEXin (KEFLEX) 500 MG capsule Take 1 capsule (500 mg total) by mouth 3 (three) times daily. 09/17/16   Paulette Blanch, MD  finasteride (PROSCAR) 5 MG tablet Take 5 mg by mouth daily.    Historical Provider, MD  lactulose (CHRONULAC) 10 GM/15ML solution Take 45 mLs (30 g total) by mouth daily as needed for moderate constipation. 09/17/16   Paulette Blanch, MD  mupirocin ointment (BACTROBAN) 2 % Place 1 application into the nose 2 (two) times daily. 09/02/16   Duanne Guess, PA-C  oxyCODONE (OXY IR/ROXICODONE) 5 MG immediate release tablet Take 1-2 tablets (5-10 mg total) by mouth every 3 (three) hours as needed for breakthrough pain. 09/02/16   Duanne Guess, PA-C  predniSONE (DELTASONE) 10 MG tablet Label  & dispense according to the schedule below. 5 Pills PO for 1 day then, 4 Pills PO for 1 day, 3 Pills PO for 1 day, 2 Pills PO for 1 day, 1 Pill PO for 1 days then STOP. Patient not taking: Reported on 01/11/2016 12/15/15   Henreitta Leber, MD   Ct Head Wo Contrast  Result Date: 10/13/2016 CLINICAL DATA:  Fall from wheelchair. Hit left side of head. Head pain. Initial encounter. EXAM: CT HEAD WITHOUT CONTRAST CT CERVICAL SPINE WITHOUT CONTRAST TECHNIQUE: Multidetector CT imaging of the head and cervical spine was performed following the standard protocol without intravenous contrast. Multiplanar CT image reconstructions of the cervical spine were also generated. COMPARISON:  Head and cervical spine CT 12/23/2014 FINDINGS: CT HEAD FINDINGS Brain: There is no evidence of acute cortical infarct, intracranial hemorrhage, mass, midline shift, or extra-axial fluid collection. Mild cerebral atrophy is unchanged. Cerebral white matter hypodensities are unchanged and compatible with moderate chronic small vessel ischemic disease. Vascular: Calcified atherosclerosis at the skullbase. No hyperdense vessel. Skull: No  fracture or focal osseous lesion. Sinuses/Orbits: The visualized paranasal sinuses and mastoid air cells are clear. Visualized orbits are unremarkable. Other: Left temporal scalp and periauricular soft tissue swelling and gas consistent with history of laceration. CT CERVICAL SPINE FINDINGS Alignment: Cervical spine straightening.  No subluxation. Skull base and vertebrae: No acute fracture. Soft tissues and spinal canal: No prevertebral fluid or swelling. No visible canal hematoma. Disc levels: Multilevel disc degeneration, advanced from C5-6 to C7-T1 with degenerative endplate changes and Schmorl's nodes including a large Schmorl's node involving the C7 superior endplate, unchanged. Broad-based posterior disc osteophyte complex at C5-6 results in mild spinal stenosis. Upper chest: Mild scarring in the lung apices. Other: None. IMPRESSION: 1. No evidence of acute intracranial abnormality. 2. Left temporal scalp and periauricular soft tissue swelling. 3. No evidence of acute cervical spine fracture or subluxation. Advanced disc degeneration. Electronically Signed   By: Logan Bores M.D.   On: 10/13/2016 07:38   Ct Cervical Spine  Wo Contrast  Result Date: 10/13/2016 CLINICAL DATA:  Fall from wheelchair. Hit left side of head. Head pain. Initial encounter. EXAM: CT HEAD WITHOUT CONTRAST CT CERVICAL SPINE WITHOUT CONTRAST TECHNIQUE: Multidetector CT imaging of the head and cervical spine was performed following the standard protocol without intravenous contrast. Multiplanar CT image reconstructions of the cervical spine were also generated. COMPARISON:  Head and cervical spine CT 12/23/2014 FINDINGS: CT HEAD FINDINGS Brain: There is no evidence of acute cortical infarct, intracranial hemorrhage, mass, midline shift, or extra-axial fluid collection. Mild cerebral atrophy is unchanged. Cerebral white matter hypodensities are unchanged and compatible with moderate chronic small vessel ischemic disease. Vascular:  Calcified atherosclerosis at the skullbase. No hyperdense vessel. Skull: No fracture or focal osseous lesion. Sinuses/Orbits: The visualized paranasal sinuses and mastoid air cells are clear. Visualized orbits are unremarkable. Other: Left temporal scalp and periauricular soft tissue swelling and gas consistent with history of laceration. CT CERVICAL SPINE FINDINGS Alignment: Cervical spine straightening.  No subluxation. Skull base and vertebrae: No acute fracture. Soft tissues and spinal canal: No prevertebral fluid or swelling. No visible canal hematoma. Disc levels: Multilevel disc degeneration, advanced from C5-6 to C7-T1 with degenerative endplate changes and Schmorl's nodes including a large Schmorl's node involving the C7 superior endplate, unchanged. Broad-based posterior disc osteophyte complex at C5-6 results in mild spinal stenosis. Upper chest: Mild scarring in the lung apices. Other: None. IMPRESSION: 1. No evidence of acute intracranial abnormality. 2. Left temporal scalp and periauricular soft tissue swelling. 3. No evidence of acute cervical spine fracture or subluxation. Advanced disc degeneration. Electronically Signed   By: Logan Bores M.D.   On: 10/13/2016 07:38    Positive ROS: All other systems have been reviewed and were otherwise negative with the exception of those mentioned in the HPI and as above.  Physical Exam: General: Alert, no acute distress  MUSCULOSKELETAL: Left lower extremity: Patient has erythema and slight wound dehiscence overlying the left knee. He has no significant active drainage.  Patient has no significant effusion. There is deformity of the anterior knee indicating patella fracture. Patient has dried skin in both lower extremities with faint erythema. The left heel has a near full-thickness ulcer which is malodorous. Patient has intact sensation to light touch in both feet. He has intact motor function in both lower extremities and can dorsiflex and plantarflex  his ankles.  Patient's pulses are difficult to palpate.  Assessment: Left knee failed patellar fixation after a fall last night Left knee postoperative infection Left heel ulcer  Plan: Patient was admitted overnight to the medicine service. He has been started on vancomycin. The patient unfortunately has refractured his patella. The wire has broken and screws are prominent in the fracture site.  There is significant diastases of the superior and inferior poles of the patella on x-ray. Patient has evidence of a postoperative infection. For now continue IV antibiotics. I will attempt to get a wound VAC for the patient. I will discuss the case with Dr. Rudene Christians and will let him decide about definitive management. The patient may need a surgical washout with hardware removal. I will also consult including podiatry have spoken with Dr. Elvina Mattes regarding this patient. He will see the patient for the left heel ulcer. Patient understood and agreed with this plan.    Thornton Park, MD    10/13/2016 8:58 AM

## 2016-10-13 NOTE — H&P (Signed)
Kurt Hill is an 82 y.o. male.   Chief Complaint: Fall HPI: The patient with past medical history of hypotension and hypothyroidism presents to the emergency department after suffering a mechanical fall. The patient has been having difficulty walking following knee replacement surgery. He's had 2 revisions to the operation due to recurrent infection. In the emergency department the patient was found to have dehiscence of his surgical incision as well as a severe laceration to his left ear. Following discussion with orthopedic service the department staff requested admission from the hospitalist service for guidance on infection and management of comorbidities.  Past Medical History:  Diagnosis Date  . Anemia   . Asthma   . BPH (benign prostatic hyperplasia)   . COPD (chronic obstructive pulmonary disease) (HCC)    not on home oxygen  . Coronary artery disease   . Hypotension   . Hypothyroidism   . Skin abnormalities   . Skin cancer     Past Surgical History:  Procedure Laterality Date  . APPENDECTOMY    . BONE RESECTION     on nose  . MOHS SURGERY    . ORIF PATELLA Left 07/30/2016   Procedure: OPEN REDUCTION INTERNAL (ORIF) FIXATION PATELLA;  Surgeon: Hessie Knows, MD;  Location: [facility] OR[facility];  Service: Orthopedics;  Laterality: Left;  . ORIF PATELLA Left 08/15/2016   Procedure: OPEN REDUCTION INTERNAL (ORIF) FIXATION PATELLA;  Surgeon: Hessie Knows, MD;  Location: [facility] OR[facility];  Service: Orthopedics;  Laterality: Left;  . TONSILLECTOMY      History reviewed. No pertinent family history. None Social History:  reports that he has never smoked. He has never used smokeless tobacco. He reports that he does not drink alcohol or use drugs.  Allergies:  Allergies  Allergen Reactions  . Ivp Dye [Iodinated Diagnostic Agents] Itching and Rash  . Sulfa Antibiotics Rash    Medications Prior to Admission  Medication Sig Dispense Refill  . albuterol (PROAIR HFA) 108 (90 Base) MCG/ACT inhaler  Inhale 2 puffs into the lungs every 6 (six) hours as needed for wheezing or shortness of breath.     Marland Kitchen albuterol (PROVENTIL HFA;VENTOLIN HFA) 108 (90 Base) MCG/ACT inhaler Inhale 2 puffs into the lungs 4 (four) times daily as needed for wheezing.    . finasteride (PROSCAR) 5 MG tablet Take 5 mg by mouth every morning.    Marland Kitchen HYDROcodone-acetaminophen (NORCO/VICODIN) 5-325 MG tablet Take 1-2 tablets by mouth every 4 (four) hours as needed (breakthrough pain). 30 tablet 0  . levothyroxine (SYNTHROID, LEVOTHROID) 100 MCG tablet Take 1 tablet by mouth daily.    . Vitamin D, Ergocalciferol, (DRISDOL) 50000 units CAPS capsule Take 1 capsule by mouth every 7 (seven) days.    Marland Kitchen albuterol (PROVENTIL) (2.5 MG/3ML) 0.083% nebulizer solution Take 2.5 mg by nebulization every 6 (six) hours as needed for wheezing or shortness of breath.    Marland Kitchen aspirin EC 325 MG tablet Take 1 tablet (325 mg total) by mouth daily. Hold this prescription until the Lovenox is complete in 2 weeks. 30 tablet 0  . aspirin EC 81 MG tablet Take 81 mg by mouth every morning.    Marland Kitchen azithromycin (ZITHROMAX) 500 MG tablet Take 0.5 tablets (250 mg total) by mouth daily. (Patient not taking: Reported on 01/11/2016) 5 tablet 0  . budesonide-formoterol (SYMBICORT) 80-4.5 MCG/ACT inhaler Inhale 2 puffs into the lungs 2 (two) times daily. 1 Inhaler 12  . budesonide-formoterol (SYMBICORT) 80-4.5 MCG/ACT inhaler Inhale 2 puffs into the lungs 2 (two) times  daily.    . cephALEXin (KEFLEX) 500 MG capsule Take 1 capsule (500 mg total) by mouth 3 (three) times daily. 21 capsule 0  . finasteride (PROSCAR) 5 MG tablet Take 5 mg by mouth daily.    Marland Kitchen lactulose (CHRONULAC) 10 GM/15ML solution Take 45 mLs (30 g total) by mouth daily as needed for moderate constipation. 473 mL 0  . mupirocin ointment (BACTROBAN) 2 % Place 1 application into the nose 2 (two) times daily. 22 g 0  . oxyCODONE (OXY IR/ROXICODONE) 5 MG immediate release tablet Take 1-2 tablets (5-10 mg  total) by mouth every 3 (three) hours as needed for breakthrough pain. 30 tablet 0  . predniSONE (DELTASONE) 10 MG tablet Label  & dispense according to the schedule below. 5 Pills PO for 1 day then, 4 Pills PO for 1 day, 3 Pills PO for 1 day, 2 Pills PO for 1 day, 1 Pill PO for 1 days then STOP. (Patient not taking: Reported on 01/11/2016) 15 tablet 0    Results for orders placed or performed during the hospital encounter of 10/13/16 (from the past 48 hour(s))  Basic metabolic panel     Status: Abnormal   Collection Time: 10/13/16  1:42 AM  Result Value Ref Range   Sodium 134 (L) 135 - 145 mmol/L   Potassium 4.3 3.5 - 5.1 mmol/L   Chloride 96 (L) 101 - 111 mmol/L   CO2 29 22 - 32 mmol/L   Glucose, Bld 138 (H) 65 - 99 mg/dL   BUN 23 (H) 6 - 20 mg/dL   Creatinine, Ser 1.20 0.61 - 1.24 mg/dL   Calcium 9.1 8.9 - 10.3 mg/dL   GFR calc non Af Amer 54 (L) >60 mL/min   GFR calc Af Amer >60 >60 mL/min    Comment: (NOTE) The eGFR has been calculated using the CKD EPI equation. This calculation has not been validated in all clinical situations. eGFR's persistently <60 mL/min signify possible Chronic Kidney Disease.    Anion gap 9 5 - 15  CBC with Differential/Platelet     Status: Abnormal   Collection Time: 10/13/16  1:42 AM  Result Value Ref Range   WBC 8.5 3.8 - 10.6 K/uL   RBC 3.49 (L) 4.40 - 5.90 MIL/uL   Hemoglobin 10.8 (L) 13.0 - 18.0 g/dL   HCT 32.0 (L) 40.0 - 52.0 %   MCV 91.8 80.0 - 100.0 fL   MCH 31.0 26.0 - 34.0 pg   MCHC 33.8 32.0 - 36.0 g/dL   RDW 14.7 (H) 11.5 - 14.5 %   Platelets 318 150 - 440 K/uL   Neutrophils Relative % 80 %   Neutro Abs 6.8 (H) 1.4 - 6.5 K/uL   Lymphocytes Relative 10 %   Lymphs Abs 0.8 (L) 1.0 - 3.6 K/uL   Monocytes Relative 8 %   Monocytes Absolute 0.7 0.2 - 1.0 K/uL   Eosinophils Relative 3 %   Eosinophils Absolute 0.2 0 - 0.7 K/uL   Basophils Relative 1 %   Basophils Absolute 0.0 0 - 0.1 K/uL  Sedimentation rate     Status: Abnormal    Collection Time: 10/13/16  1:42 AM  Result Value Ref Range   Sed Rate 91 (H) 0 - 20 mm/hr  TSH     Status: Abnormal   Collection Time: 10/13/16  1:42 AM  Result Value Ref Range   TSH 28.442 (H) 0.350 - 4.500 uIU/mL    Comment: Performed by a 3rd Generation assay with  a functional sensitivity of <=0.01 uIU/mL.   No results found.  Review of Systems  Constitutional: Negative for chills and fever.  HENT: Negative for sore throat and tinnitus.   Eyes: Negative for blurred vision and redness.  Respiratory: Negative for cough and shortness of breath.   Cardiovascular: Negative for chest pain, palpitations, orthopnea and PND.  Gastrointestinal: Negative for abdominal pain, diarrhea, nausea and vomiting.  Genitourinary: Negative for dysuria, frequency and urgency.  Musculoskeletal: Negative for joint pain and myalgias.       Left leg and ankle/foot pain  Skin: Negative for rash.       No lesions  Neurological: Negative for speech change, focal weakness and weakness.  Endo/Heme/Allergies: Does not bruise/bleed easily.       No temperature intolerance  Psychiatric/Behavioral: Negative for depression and suicidal ideas.    Blood pressure 124/62, pulse 79, temperature 98.7 F (37.1 C), temperature source Oral, resp. rate 20, height 5' 10"  (1.778 m), weight 70.4 kg (155 lb 4 oz), SpO2 94 %. Physical Exam  Constitutional: He is oriented to person, place, and time. He appears well-developed and well-nourished. No distress.  HENT:  Head: Normocephalic and atraumatic.  Mouth/Throat: Oropharynx is clear and moist.  Laceration to left ear  Eyes: Conjunctivae and EOM are normal. Pupils are equal, round, and reactive to light. No scleral icterus.  Neck: Normal range of motion. Neck supple. No JVD present. No tracheal deviation present. No thyromegaly present.  Cardiovascular: Normal rate, regular rhythm and normal heart sounds.  Exam reveals no gallop and no friction rub.   No murmur  heard. Respiratory: Effort normal and breath sounds normal.  GI: Soft. Bowel sounds are normal. He exhibits no distension. There is no tenderness.  Genitourinary:  Genitourinary Comments: Deferred  Musculoskeletal: He exhibits no edema.  Decreased range of motion of left knee  Lymphadenopathy:    He has no cervical adenopathy.  Neurological: He is alert and oriented to person, place, and time. No cranial nerve deficit.  Skin: Skin is warm and dry. No rash noted. No erythema.  Erythema warmth and swelling of left knee; dehiscence of surgical incision with visible sutures. Also chronic dry skin changes to the lower extremities  Psychiatric: He has a normal mood and affect. His behavior is normal. Judgment and thought content normal.     Assessment/Plan This is an 82 year old male admitted for recurrent cellulitis. 1. Cellulitis: Recurrent; failed outpatient treatment. The patient will likely need surgical cleanout of the wound plus or minus impregnation of antibiotic beads. No signs or symptoms of sepsis at this time. 2. COPD: Stable; continue inhaled corticosteroid and albuterol as needed 3. Hypothyroidism: Continue Synthroid 4. BPH: Continue Proscar 5. DVT prophylaxis: Aspirin for now neck line  6. GI prophylaxis: None The patient is a full code. Time spent on admission orders and patient care approximately 45 minutes  Harrie Foreman, MD 10/13/2016, 6:53 AM

## 2016-10-13 NOTE — Progress Notes (Signed)
dsg changed to wet to dry dsg after applying santyl ointment to left heel as ordered

## 2016-10-13 NOTE — ED Notes (Signed)
Patient reports pain is better now 7/10 prior to pain medication was 10/10.

## 2016-10-13 NOTE — Consult Note (Signed)
Patient Demographics  Kurt Hill, is a 82 y.o. male   MRN: [id number]   DOB - [birth year]  Admit Date - 10/13/2016    Outpatient Primary MD for the patient is Tracie Harrier, MD  Consult requested in the Hospital by Hillary Bow, MD, On 10/13/2016    Reason for consult decubitus ulcer left heel   With History of -  Past Medical History:  Diagnosis Date  . Anemia   . Asthma   . BPH (benign prostatic hyperplasia)   . COPD (chronic obstructive pulmonary disease) (HCC)    not on home oxygen  . Coronary artery disease   . Hypotension   . Hypothyroidism   . Skin abnormalities   . Skin cancer       Past Surgical History:  Procedure Laterality Date  . APPENDECTOMY    . BONE RESECTION     on nose  . MOHS SURGERY    . ORIF PATELLA Left 07/30/2016   Procedure: OPEN REDUCTION INTERNAL (ORIF) FIXATION PATELLA;  Surgeon: Hessie Knows, MD;  Location: [facility] ORS;  Service: Orthopedics;  Laterality: Left;  . ORIF PATELLA Left 08/15/2016   Procedure: OPEN REDUCTION INTERNAL (ORIF) FIXATION PATELLA;  Surgeon: Hessie Knows, MD;  Location: [facility] ORS;  Service: Orthopedics;  Laterality: Left;  . TONSILLECTOMY      in for   Chief Complaint  Patient presents with  . Fall  . Ear Laceration     HPI  Kurt Hill  is a 82 y.o. male, Patient has had issues with infection status post knee replacement surgery has fallen recently and was admitted to the emergency room. He has developed a decubitus ulceration on his left heel that I been asked to evaluate.   .   Social History Social History  Substance Use Topics  . Smoking status: Never Smoker  . Smokeless tobacco: Never Used     Comment: tried cigarettes when young- but never really smoked  . Alcohol use No     Family History History reviewed. No pertinent  family history.  Prior to Admission medications   Medication Sig Start Date End Date Taking? Authorizing Provider  albuterol (PROAIR HFA) 108 (90 Base) MCG/ACT inhaler Inhale 2 puffs into the lungs every 6 (six) hours as needed for wheezing or shortness of breath.  12/06/15 12/05/16 Yes Historical Provider, MD  albuterol (PROVENTIL HFA;VENTOLIN HFA) 108 (90 Base) MCG/ACT inhaler Inhale 2 puffs into the lungs 4 (four) times daily as needed for wheezing. 06/20/16 06/20/17 Yes Historical Provider, MD  finasteride (PROSCAR) 5 MG tablet Take 5 mg by mouth every morning. 03/12/16 03/12/17 Yes Historical Provider, MD  HYDROcodone-acetaminophen (NORCO/VICODIN) 5-325 MG tablet Take 1-2 tablets by mouth every 4 (four) hours as needed (breakthrough pain). 08/17/16  Yes Reche Dixon, PA-C  levothyroxine (SYNTHROID, LEVOTHROID) 100 MCG tablet Take 1 tablet by mouth daily. 09/04/15  Yes Historical Provider, MD  Vitamin D, Ergocalciferol, (DRISDOL) 50000 units CAPS capsule Take 1 capsule by mouth every 7 (seven) days. 09/04/15  Yes Historical Provider, MD  albuterol (PROVENTIL) (2.5 MG/3ML) 0.083% nebulizer solution Take 2.5 mg by nebulization every 6 (six) hours as needed for wheezing or shortness of breath.    Historical  Provider, MD  aspirin EC 325 MG tablet Take 1 tablet (325 mg total) by mouth daily. Hold this prescription until the Lovenox is complete in 2 weeks. 08/17/16   Reche Dixon, PA-C  aspirin EC 81 MG tablet Take 81 mg by mouth every morning.    Historical Provider, MD  azithromycin (ZITHROMAX) 500 MG tablet Take 0.5 tablets (250 mg total) by mouth daily. Patient not taking: Reported on 01/11/2016 12/15/15   Henreitta Leber, MD  budesonide-formoterol [facility]) 80-4.5 MCG/ACT inhaler Inhale 2 puffs into the lungs 2 (two) times daily. 12/15/15   Henreitta Leber, MD  budesonide-formoterol (SYMBICORT) 80-4.5 MCG/ACT inhaler Inhale 2 puffs into the lungs 2 (two) times daily.    Historical Provider, MD  cephALEXin (KEFLEX)  500 MG capsule Take 1 capsule (500 mg total) by mouth 3 (three) times daily. 09/17/16   Paulette Blanch, MD  lactulose (CHRONULAC) 10 GM/15ML solution Take 45 mLs (30 g total) by mouth daily as needed for moderate constipation. 09/17/16   Paulette Blanch, MD  mupirocin ointment (BACTROBAN) 2 % Place 1 application into the nose 2 (two) times daily. 09/02/16   Duanne Guess, PA-C  oxyCODONE (OXY IR/ROXICODONE) 5 MG immediate release tablet Take 1-2 tablets (5-10 mg total) by mouth every 3 (three) hours as needed for breakthrough pain. 09/02/16   Duanne Guess, PA-C  predniSONE (DELTASONE) 10 MG tablet Label  & dispense according to the schedule below. 5 Pills PO for 1 day then, 4 Pills PO for 1 day, 3 Pills PO for 1 day, 2 Pills PO for 1 day, 1 Pill PO for 1 days then STOP. Patient not taking: Reported on 01/11/2016 12/15/15   Henreitta Leber, MD    Anti-infectives    Start     Dose/Rate Route Frequency Ordered Stop   10/13/16 1600  vancomycin (VANCOCIN) IVPB 1000 mg/200 mL premix     1,000 mg 200 mL/hr over 60 Minutes Intravenous Every 18 hours 10/13/16 0729     10/13/16 0330  vancomycin (VANCOCIN) IVPB 1000 mg/200 mL premix     1,000 mg 200 mL/hr over 60 Minutes Intravenous  Once 10/13/16 0327 10/13/16 0510      Scheduled Meds: . aspirin EC  81 mg Oral q morning - 10a  . docusate sodium  100 mg Oral BID  . finasteride  5 mg Oral Daily  . levothyroxine  150 mcg Oral QAC breakfast  . lidocaine-EPINEPHrine      . mometasone-formoterol  2 puff Inhalation BID  . morphine      . mupirocin ointment  1 application Nasal BID  . ondansetron      . sodium chloride flush  3 mL Intravenous Q12H  . vancomycin  1,000 mg Intravenous Q18H  . Vitamin D (Ergocalciferol)  50,000 Units Oral Q7 days   Continuous Infusions: PRN Meds:.acetaminophen **OR** acetaminophen, albuterol, lactulose, morphine injection, ondansetron **OR** ondansetron (ZOFRAN) IV, oxyCODONE  Allergies  Allergen Reactions  . Ivp Dye  [Iodinated Diagnostic Agents] Itching and Rash  . Sulfa Antibiotics Rash    Physical Exam: Patient is alert seems to be oriented fairly well. No apparent distress.  Vitals  Blood pressure (!) 109/52, pulse (!) 58, temperature 97.8 F (36.6 C), temperature source Oral, resp. rate 18, height 5\' 10"  (1.778 m), weight 70.4 kg (155 lb 4 oz), SpO2 96 %.  Lower Extremity exam:  Vascular: Diminished but +1 over 4 to the dorsalis pedis posterior tibial arteries.  Dermatological: Patient has a  wound on his left heels posteriorly on the calcaneal region approximately 3.2 cm in length and about 3.5 cm in width. There is an eschar on the back of it that does not appear to be infected at this point but there is lack of healing to the region and minimal progression. Patient's foot is resting on the bed at this timeframe. The redness erythema or progressing cellulitis is noted around the heel.  Neurological: Patient has good bit of pain with lifting of the legs mostly emanating from his knee. States she does feel things in his feet and on that heel.  Ortho: No gross deformities at this timeframe. Wound does not appear to be involving bone but there is a possibility.  Data Review  CBC  Recent Labs Lab 10/13/16 0142  WBC 8.5  HGB 10.8*  HCT 32.0*  PLT 318  MCV 91.8  MCH 31.0  MCHC 33.8  RDW 14.7*  LYMPHSABS 0.8*  MONOABS 0.7  EOSABS 0.2  BASOSABS 0.0   ------------------------------------------------------------------------------------------------------------------  Chemistries   Recent Labs Lab 10/13/16 0142  NA 134*  K 4.3  CL 96*  CO2 29  GLUCOSE 138*  BUN 23*  CREATININE 1.20  CALCIUM 9.1   -----------------------------------------------------------------------------------------------------Imaging results:   Ct Head Wo Contrast  Result Date: 10/13/2016 CLINICAL DATA:  Fall from wheelchair. Hit left side of head. Head pain. Initial encounter. EXAM: CT HEAD WITHOUT  CONTRAST CT CERVICAL SPINE WITHOUT CONTRAST TECHNIQUE: Multidetector CT imaging of the head and cervical spine was performed following the standard protocol without intravenous contrast. Multiplanar CT image reconstructions of the cervical spine were also generated. COMPARISON:  Head and cervical spine CT 12/23/2014 FINDINGS: CT HEAD FINDINGS Brain: There is no evidence of acute cortical infarct, intracranial hemorrhage, mass, midline shift, or extra-axial fluid collection. Mild cerebral atrophy is unchanged. Cerebral white matter hypodensities are unchanged and compatible with moderate chronic small vessel ischemic disease. Vascular: Calcified atherosclerosis at the skullbase. No hyperdense vessel. Skull: No fracture or focal osseous lesion. Sinuses/Orbits: The visualized paranasal sinuses and mastoid air cells are clear. Visualized orbits are unremarkable. Other: Left temporal scalp and periauricular soft tissue swelling and gas consistent with history of laceration. CT CERVICAL SPINE FINDINGS Alignment: Cervical spine straightening.  No subluxation. Skull base and vertebrae: No acute fracture. Soft tissues and spinal canal: No prevertebral fluid or swelling. No visible canal hematoma. Disc levels: Multilevel disc degeneration, advanced from C5-6 to C7-T1 with degenerative endplate changes and Schmorl's nodes including a large Schmorl's node involving the C7 superior endplate, unchanged. Broad-based posterior disc osteophyte complex at C5-6 results in mild spinal stenosis. Upper chest: Mild scarring in the lung apices. Other: None. IMPRESSION: 1. No evidence of acute intracranial abnormality. 2. Left temporal scalp and periauricular soft tissue swelling. 3. No evidence of acute cervical spine fracture or subluxation. Advanced disc degeneration. Electronically Signed   By: Logan Bores M.D.   On: 10/13/2016 07:38   Ct Cervical Spine Wo Contrast  Result Date: 10/13/2016 CLINICAL DATA:  Fall from wheelchair. Hit  left side of head. Head pain. Initial encounter. EXAM: CT HEAD WITHOUT CONTRAST CT CERVICAL SPINE WITHOUT CONTRAST TECHNIQUE: Multidetector CT imaging of the head and cervical spine was performed following the standard protocol without intravenous contrast. Multiplanar CT image reconstructions of the cervical spine were also generated. COMPARISON:  Head and cervical spine CT 12/23/2014 FINDINGS: CT HEAD FINDINGS Brain: There is no evidence of acute cortical infarct, intracranial hemorrhage, mass, midline shift, or extra-axial fluid collection. Mild cerebral atrophy  is unchanged. Cerebral white matter hypodensities are unchanged and compatible with moderate chronic small vessel ischemic disease. Vascular: Calcified atherosclerosis at the skullbase. No hyperdense vessel. Skull: No fracture or focal osseous lesion. Sinuses/Orbits: The visualized paranasal sinuses and mastoid air cells are clear. Visualized orbits are unremarkable. Other: Left temporal scalp and periauricular soft tissue swelling and gas consistent with history of laceration. CT CERVICAL SPINE FINDINGS Alignment: Cervical spine straightening.  No subluxation. Skull base and vertebrae: No acute fracture. Soft tissues and spinal canal: No prevertebral fluid or swelling. No visible canal hematoma. Disc levels: Multilevel disc degeneration, advanced from C5-6 to C7-T1 with degenerative endplate changes and Schmorl's nodes including a large Schmorl's node involving the C7 superior endplate, unchanged. Broad-based posterior disc osteophyte complex at C5-6 results in mild spinal stenosis. Upper chest: Mild scarring in the lung apices. Other: None. IMPRESSION: 1. No evidence of acute intracranial abnormality. 2. Left temporal scalp and periauricular soft tissue swelling. 3. No evidence of acute cervical spine fracture or subluxation. Advanced disc degeneration. Electronically Signed   By: Logan Bores M.D.   On: 10/13/2016 07:38   Assessment & Plan to keep  this ulcer left heel dimensions as mentioned above. Patient needs better pressure relief from the region and I will write a prescription for Santyl and wet-to-dry saline dressings daily along with a heel protecting boot. Dressing should be changed daily. This will need to be followed over the next few weeks to see if it is progressing and what further debridement was necessary.  Active Problems:   Cellulitis   Pressure injury of skin   Family Communication: Plan discussed with patient    Perry Mount M.D on 10/13/2016 at 1:55 PM  Thank you for the consult, we will follow the patient with you in the Hospital.

## 2016-10-13 NOTE — Care Management Note (Addendum)
Case Management Note  Patient Details  Name: Kurt Hill MRN: [id number] Date of Birth: 08-20-1932  Subjective/Objective:   Kurt Kurt Hill lives at home with his son. He fell at home and fractured his left patella for a third time.. Dr Rudene Christians operated on this same left patella in 07/30/16 and again on 08/15/16. Kurt Hill also has a left heel decubiti followed by Dr Elvina Mattes. Kurt Kurt Hill has thus far resisted going to rehab. He has a RW and a BSC at home. He has a ramp to get into his house. His PCP=Dr Hande. Case management will follow for discharge planning. Kurt Kurt Hill is currently open to [facility] and is receiving RN and PT services per Ardeen Fillers at [place].                  Action/Plan:   Expected Discharge Date:                  Expected Discharge Plan:     In-House Referral:     Discharge planning Services     Post Acute Care Choice:    Choice offered to:     DME Arranged:    DME Agency:     HH Arranged:    HH Agency:     Status of Service:     If discussed at [employer] of Stay Meetings, dates discussed:    Additional Comments:  Theressa Piedra A, RN 10/13/2016, 3:37 PM

## 2016-10-13 NOTE — ED Notes (Signed)
Resting quietly at this time.

## 2016-10-13 NOTE — Progress Notes (Signed)
[facility] [place] NAME: Kurt Hill    MR#:  [id number]  DATE OF BIRTH:  1931-12-20  SUBJECTIVE:  CHIEF COMPLAINT:   Chief Complaint  Patient presents with  . Fall  . Ear Laceration   Admitted after a fall. Found to have left knee postop infection. Left heel ulcer. Afebrile.  REVIEW OF SYSTEMS:    Review of Systems  Constitutional: Positive for malaise/fatigue. Negative for chills and fever.  HENT: Negative for sore throat.   Eyes: Negative for blurred vision, double vision and pain.  Respiratory: Negative for cough, hemoptysis, shortness of breath and wheezing.   Cardiovascular: Negative for chest pain, palpitations, orthopnea and leg swelling.  Gastrointestinal: Negative for abdominal pain, constipation, diarrhea, heartburn, nausea and vomiting.  Genitourinary: Negative for dysuria and hematuria.  Musculoskeletal: Positive for back pain, falls and joint pain.  Skin: Negative for rash.  Neurological: Positive for weakness. Negative for sensory change, speech change, focal weakness and headaches.  Endo/Heme/Allergies: Does not bruise/bleed easily.  Psychiatric/Behavioral: Negative for depression. The patient is not nervous/anxious.     DRUG ALLERGIES:   Allergies  Allergen Reactions  . Ivp Dye [Iodinated Diagnostic Agents] Itching and Rash  . Sulfa Antibiotics Rash    VITALS:  Blood pressure (!) 105/48, pulse 80, temperature 98.5 F (36.9 C), temperature source Oral, resp. rate 18, height 5\' 10"  (1.778 m), weight 70.4 kg (155 lb 4 oz), SpO2 98 %.  PHYSICAL EXAMINATION:   Physical Exam  GENERAL:  82 y.o.-year-old patient lying in the bed with no acute distress.  EYES: Pupils equal, round, reactive to light and accommodation. No scleral icterus. Extraocular muscles intact.  LUNGS: Normal breath sounds bilaterally, no wheezing, rales, rhonchi. No use of accessory muscles of respiration.  CARDIOVASCULAR: S1, S2 normal. No  murmurs, rubs, or gallops.  ABDOMEN: Soft, nontender, nondistended. Bowel sounds present. No organomegaly or mass.  EXTREMITIES: No cyanosis, clubbing or edema b/l.    PSYCHIATRIC: The patient is alert and oriented x 3.   Left knee wound and left heel ulcer and a dressing  LABORATORY PANEL:   CBC  Recent Labs Lab 10/13/16 0142  WBC 8.5  HGB 10.8*  HCT 32.0*  PLT 318   ------------------------------------------------------------------------------------------------------------------ Chemistries   Recent Labs Lab 10/13/16 0142  NA 134*  K 4.3  CL 96*  CO2 29  GLUCOSE 138*  BUN 23*  CREATININE 1.20  CALCIUM 9.1   ------------------------------------------------------------------------------------------------------------------  Cardiac Enzymes No results for input(s): TROPONINI in the last 168 hours. ------------------------------------------------------------------------------------------------------------------  RADIOLOGY:  Ct Head Wo Contrast  Result Date: 10/13/2016 CLINICAL DATA:  Fall from wheelchair. Hit left side of head. Head pain. Initial encounter. EXAM: CT HEAD WITHOUT CONTRAST CT CERVICAL SPINE WITHOUT CONTRAST TECHNIQUE: Multidetector CT imaging of the head and cervical spine was performed following the standard protocol without intravenous contrast. Multiplanar CT image reconstructions of the cervical spine were also generated. COMPARISON:  Head and cervical spine CT 12/23/2014 FINDINGS: CT HEAD FINDINGS Brain: There is no evidence of acute cortical infarct, intracranial hemorrhage, mass, midline shift, or extra-axial fluid collection. Mild cerebral atrophy is unchanged. Cerebral white matter hypodensities are unchanged and compatible with moderate chronic small vessel ischemic disease. Vascular: Calcified atherosclerosis at the skullbase. No hyperdense vessel. Skull: No fracture or focal osseous lesion. Sinuses/Orbits: The visualized paranasal sinuses and mastoid  air cells are clear. Visualized orbits are unremarkable. Other: Left temporal scalp and periauricular soft tissue swelling and gas consistent with history of  laceration. CT CERVICAL SPINE FINDINGS Alignment: Cervical spine straightening.  No subluxation. Skull base and vertebrae: No acute fracture. Soft tissues and spinal canal: No prevertebral fluid or swelling. No visible canal hematoma. Disc levels: Multilevel disc degeneration, advanced from C5-6 to C7-T1 with degenerative endplate changes and Schmorl's nodes including a large Schmorl's node involving the C7 superior endplate, unchanged. Broad-based posterior disc osteophyte complex at C5-6 results in mild spinal stenosis. Upper chest: Mild scarring in the lung apices. Other: None. IMPRESSION: 1. No evidence of acute intracranial abnormality. 2. Left temporal scalp and periauricular soft tissue swelling. 3. No evidence of acute cervical spine fracture or subluxation. Advanced disc degeneration. Electronically Signed   By: Logan Bores M.D.   On: 10/13/2016 07:38   Ct Cervical Spine Wo Contrast  Result Date: 10/13/2016 CLINICAL DATA:  Fall from wheelchair. Hit left side of head. Head pain. Initial encounter. EXAM: CT HEAD WITHOUT CONTRAST CT CERVICAL SPINE WITHOUT CONTRAST TECHNIQUE: Multidetector CT imaging of the head and cervical spine was performed following the standard protocol without intravenous contrast. Multiplanar CT image reconstructions of the cervical spine were also generated. COMPARISON:  Head and cervical spine CT 12/23/2014 FINDINGS: CT HEAD FINDINGS Brain: There is no evidence of acute cortical infarct, intracranial hemorrhage, mass, midline shift, or extra-axial fluid collection. Mild cerebral atrophy is unchanged. Cerebral white matter hypodensities are unchanged and compatible with moderate chronic small vessel ischemic disease. Vascular: Calcified atherosclerosis at the skullbase. No hyperdense vessel. Skull: No fracture or focal  osseous lesion. Sinuses/Orbits: The visualized paranasal sinuses and mastoid air cells are clear. Visualized orbits are unremarkable. Other: Left temporal scalp and periauricular soft tissue swelling and gas consistent with history of laceration. CT CERVICAL SPINE FINDINGS Alignment: Cervical spine straightening.  No subluxation. Skull base and vertebrae: No acute fracture. Soft tissues and spinal canal: No prevertebral fluid or swelling. No visible canal hematoma. Disc levels: Multilevel disc degeneration, advanced from C5-6 to C7-T1 with degenerative endplate changes and Schmorl's nodes including a large Schmorl's node involving the C7 superior endplate, unchanged. Broad-based posterior disc osteophyte complex at C5-6 results in mild spinal stenosis. Upper chest: Mild scarring in the lung apices. Other: None. IMPRESSION: 1. No evidence of acute intracranial abnormality. 2. Left temporal scalp and periauricular soft tissue swelling. 3. No evidence of acute cervical spine fracture or subluxation. Advanced disc degeneration. Electronically Signed   By: Logan Bores M.D.   On: 10/13/2016 07:38     ASSESSMENT AND PLAN:   This is an 82 year old male admitted for recurrent cellulitis.  1. Left knee post-op infection On IV abx Wound vac to be placed today OR tomorrow Discussed with Dr. Mack Guise  # Left heel ulcer Podiatry consult.  2. COPD: Stable; continue inhaled corticosteroid and albuterol as needed  3. Hypothyroidism: Continue Synthroid  4. BPH: Continue Proscar  All the records are reviewed and case discussed with Care Management/Social Workerr. Management plans discussed with the patient, family and they are in agreement.  CODE STATUS: FULL  DVT Prophylaxis: SCDs  TOTAL TIME TAKING CARE OF THIS PATIENT:30 minutes.   POSSIBLE D/C IN 3-4 DAYS, DEPENDING ON CLINICAL CONDITION.  Hillary Bow R M.D on 10/13/2016 at 10:18 AM  Between 7am to 6pm - Pager - [phone]  After 6pm go  to [email or web address] - password [email or web address] [facility]  Office  [phone]  CC: Primary care physician; Tracie Harrier, MD  Note: This dictation was prepared with Dragon dictation along with smaller phrase technology. Any transcriptional errors that  result from this process are unintentional.

## 2016-10-13 NOTE — Progress Notes (Signed)
Patient BP 74/42. MD notified. Orders received.

## 2016-10-13 NOTE — ED Provider Notes (Signed)
[facility] Emergency Department Provider Note   ____________________________________________   First MD Initiated Contact with Patient 10/13/16 0112     (approximate)  I have reviewed the triage vital signs and the nursing notes.   HISTORY  Chief Complaint Fall and Ear Laceration    HPI Kurt Hill is a 82 y.o. male who comes into the hospital today after a fall. The patient reports that he was getting a midnight snack when he fell. He was using his wheelchair initially but then when he transitions to his walker he fell somehow. He thinks his walker may have hit something or got tangled in his chair. The patient reports that he tripped and hit his head on a harth. The patient had to drag himself to find the phone before he called 911. He denies any dizziness or visual changes. He's had no chest pain. The patient denies any loss of consciousness. He thinks that he really just lost his balance. According to EMS the area did bleed a lot. He is on aspirin but hasn't taken it in a few days. He denies any air pain but reports that he did land on his knee which he previously had surgery. The patient rates his pain a 3 out of 10 in intensity. His surgery was done by Dr. Rudene Christians.   Past Medical History:  Diagnosis Date  . Anemia   . Asthma   . BPH (benign prostatic hyperplasia)   . COPD (chronic obstructive pulmonary disease) (HCC)    not on home oxygen  . Coronary artery disease   . Hypotension   . Hypothyroidism   . Skin abnormalities   . Skin cancer     Patient Active Problem List   Diagnosis Date Noted  . Cellulitis of left leg 08/30/2016  . Post op infection 08/30/2016  . Cellulitis 08/30/2016  . Left patella fracture 08/15/2016  . Patella fracture 07/30/2016  . COPD exacerbation ([place]) 12/14/2015    Past Surgical History:  Procedure Laterality Date  . APPENDECTOMY    . BONE RESECTION     on nose  . MOHS SURGERY    . ORIF PATELLA Left  07/30/2016   Procedure: OPEN REDUCTION INTERNAL (ORIF) FIXATION PATELLA;  Surgeon: Hessie Knows, MD;  Location: [facility] ORS;  Service: Orthopedics;  Laterality: Left;  . ORIF PATELLA Left 08/15/2016   Procedure: OPEN REDUCTION INTERNAL (ORIF) FIXATION PATELLA;  Surgeon: Hessie Knows, MD;  Location: [facility] ORS;  Service: Orthopedics;  Laterality: Left;  . TONSILLECTOMY      Prior to Admission medications   Medication Sig Start Date End Date Taking? Authorizing Provider  albuterol (PROAIR HFA) 108 (90 Base) MCG/ACT inhaler Inhale 2 puffs into the lungs every 6 (six) hours as needed for wheezing or shortness of breath.  12/06/15 12/05/16 Yes Historical Provider, MD  albuterol (PROVENTIL HFA;VENTOLIN HFA) 108 (90 Base) MCG/ACT inhaler Inhale 2 puffs into the lungs 4 (four) times daily as needed for wheezing. 06/20/16 06/20/17 Yes Historical Provider, MD  finasteride (PROSCAR) 5 MG tablet Take 5 mg by mouth every morning. 03/12/16 03/12/17 Yes Historical Provider, MD  HYDROcodone-acetaminophen (NORCO/VICODIN) 5-325 MG tablet Take 1-2 tablets by mouth every 4 (four) hours as needed (breakthrough pain). 08/17/16  Yes Reche Dixon, PA-C  levothyroxine (SYNTHROID, LEVOTHROID) 100 MCG tablet Take 1 tablet by mouth daily. 09/04/15  Yes Historical Provider, MD  Vitamin D, Ergocalciferol, (DRISDOL) 50000 units CAPS capsule Take 1 capsule by mouth every 7 (seven) days. 09/04/15  Yes Historical  Provider, MD  albuterol (PROVENTIL) (2.5 MG/3ML) 0.083% nebulizer solution Take 2.5 mg by nebulization every 6 (six) hours as needed for wheezing or shortness of breath.    Historical Provider, MD  aspirin EC 325 MG tablet Take 1 tablet (325 mg total) by mouth daily. Hold this prescription until the Lovenox is complete in 2 weeks. 08/17/16   Reche Dixon, PA-C  aspirin EC 81 MG tablet Take 81 mg by mouth every morning.    Historical Provider, MD  azithromycin (ZITHROMAX) 500 MG tablet Take 0.5 tablets (250 mg total) by mouth daily. Patient not  taking: Reported on 01/11/2016 12/15/15   Henreitta Leber, MD  budesonide-formoterol [facility]) 80-4.5 MCG/ACT inhaler Inhale 2 puffs into the lungs 2 (two) times daily. 12/15/15   Henreitta Leber, MD  budesonide-formoterol (SYMBICORT) 80-4.5 MCG/ACT inhaler Inhale 2 puffs into the lungs 2 (two) times daily.    Historical Provider, MD  cephALEXin (KEFLEX) 500 MG capsule Take 1 capsule (500 mg total) by mouth 3 (three) times daily. 09/17/16   Paulette Blanch, MD  finasteride (PROSCAR) 5 MG tablet Take 5 mg by mouth daily.    Historical Provider, MD  lactulose (CHRONULAC) 10 GM/15ML solution Take 45 mLs (30 g total) by mouth daily as needed for moderate constipation. 09/17/16   Paulette Blanch, MD  mupirocin ointment (BACTROBAN) 2 % Place 1 application into the nose 2 (two) times daily. 09/02/16   Duanne Guess, PA-C  oxyCODONE (OXY IR/ROXICODONE) 5 MG immediate release tablet Take 1-2 tablets (5-10 mg total) by mouth every 3 (three) hours as needed for breakthrough pain. 09/02/16   Duanne Guess, PA-C  predniSONE (DELTASONE) 10 MG tablet Label  & dispense according to the schedule below. 5 Pills PO for 1 day then, 4 Pills PO for 1 day, 3 Pills PO for 1 day, 2 Pills PO for 1 day, 1 Pill PO for 1 days then STOP. Patient not taking: Reported on 01/11/2016 12/15/15   Henreitta Leber, MD    Allergies Ivp dye [iodinated diagnostic agents] and Sulfa antibiotics  History reviewed. No pertinent family history.  Social History Social History  Substance Use Topics  . Smoking status: Never Smoker  . Smokeless tobacco: Never Used     Comment: tried cigarettes when young- but never really smoked  . Alcohol use No    Review of Systems Constitutional: No fever/chills Eyes: No visual changes. ENT: No sore throat. Cardiovascular: Denies chest pain. Respiratory: Denies shortness of breath. Gastrointestinal: No abdominal pain.  No nausea, no vomiting.  No diarrhea.  No constipation. Genitourinary: Negative for  dysuria. Musculoskeletal:F knee pain Skin: Laceration. Neurological: Negative for headaches, focal weakness or numbness.  10-point ROS otherwise negative.  ____________________________________________   PHYSICAL EXAM:  VITAL SIGNS: ED Triage Vitals  Enc Vitals Group     BP 10/13/16 0130 130/63     Pulse Rate 10/13/16 0130 95     Resp 10/13/16 0136 20     Temp 10/13/16 0136 98.7 F (37.1 C)     Temp Source 10/13/16 0136 Oral     SpO2 10/13/16 0130 98 %     Weight 10/13/16 0136 155 lb 4 oz (70.4 kg)     Height 10/13/16 0136 5\' 10"  (1.778 m)     Head Circumference --      [facility] Flow --      Pain Score 10/13/16 0137 3     Pain Loc --      Pain Edu? --  Excl. in [place]? --     Constitutional: Alert and oriented. Well appearing and in Moderate distress. Eyes: Conjunctivae are normal. PERRL. EOMI. Head: Approximately 3 cm laceration to the left temporal area along with a laceration to the left preauricular area Nose: No congestion/rhinnorhea. Mouth/Throat: Mucous membranes are moist.  Oropharynx non-erythematous. Neck: No cervical spine tenderness to palpation. Cardiovascular: Normal rate, regular rhythm. Grossly normal heart sounds.  Good peripheral circulation. Respiratory: Normal respiratory effort.  No retractions. Lungs CTAB. Gastrointestinal: Soft and nontender. Positive bowel sounds Musculoskeletal: No lower extremity tenderness nor edema.   Neurologic:  Normal speech and language.  Skin:  Skin is warm, dry and intact. Erythema over left knee with some warmth and swelling. Patient has a laceration with some drainage. Ears some pain with palpation. Psychiatric: Mood and affect are normal. Speech and behavior are normal.  ____________________________________________   LABS (all labs ordered are listed, but only abnormal results are displayed)  Labs Reviewed  BASIC METABOLIC PANEL - Abnormal; Notable for the following:       Result Value   Sodium 134 (*)    Chloride  96 (*)    Glucose, Bld 138 (*)    BUN 23 (*)    GFR calc non Af Amer 54 (*)    All other components within normal limits  CBC WITH DIFFERENTIAL/PLATELET - Abnormal; Notable for the following:    RBC 3.49 (*)    Hemoglobin 10.8 (*)    HCT 32.0 (*)    RDW 14.7 (*)    Neutro Abs 6.8 (*)    Lymphs Abs 0.8 (*)    All other components within normal limits  SEDIMENTATION RATE - Abnormal; Notable for the following:    Sed Rate 91 (*)    All other components within normal limits  TSH - Abnormal; Notable for the following:    TSH 28.442 (*)    All other components within normal limits  CULTURE, BLOOD (ROUTINE X 2) W REFLEX TO ID PANEL  CULTURE, BLOOD (ROUTINE X 2) W REFLEX TO ID PANEL  SURGICAL PCR SCREEN  C-REACTIVE PROTEIN  URINALYSIS COMPLETEWITH MICROSCOPIC ([facility] ONLY)  HEMOGLOBIN A1C   ____________________________________________  EKG  none ____________________________________________  RADIOLOGY  CT head and cervical spine ____________________________________________   PROCEDURES  Procedure(s) performed: please, see procedure note(s).  Marland Kitchen.Laceration Repair Date/Time: 10/13/2016 4:48 AM Performed by: Loney Hering Authorized by: Loney Hering   Anesthesia (see MAR for exact dosages):    Anesthesia method:  Local infiltration   Local anesthetic:  Lidocaine 1% WITH epi Laceration details:    Location:  Scalp   Scalp location:  L temporal   Length (cm):  3 Repair type:    Repair type:  Simple Pre-procedure details:    Preparation:  Patient was prepped and draped in usual sterile fashion and imaging obtained to evaluate for foreign bodies Exploration:    Contaminated: no   Treatment:    Area cleansed with:  Saline   Amount of cleaning:  Standard Skin repair:    Repair method:  Sutures   Suture size:  5-0   Suture material:  Prolene   Suture technique:  Simple interrupted   Number of sutures:  4 Post-procedure details:    Dressing:  Open (no  dressing)   Patient tolerance of procedure:  Tolerated well, no immediate complications .Marland KitchenLaceration Repair Date/Time: 10/13/2016 5:00 AM Performed by: Loney Hering Authorized by: Loney Hering   Anesthesia (see MAR for exact dosages):  Anesthesia method:  Local infiltration   Local anesthetic:  Lidocaine 1% WITH epi Laceration details:    Location:  Ear   Ear location:  L ear   Length (cm):  5 Repair type:    Repair type:  Simple Pre-procedure details:    Preparation:  Patient was prepped and draped in usual sterile fashion and imaging obtained to evaluate for foreign bodies Exploration:    Hemostasis achieved with:  Direct pressure   Wound exploration: entire depth of wound probed and visualized     Contaminated: no   Treatment:    Area cleansed with:  Saline   Amount of cleaning:  Standard Skin repair:    Repair method:  Sutures   Suture size:  5-0   Suture material:  Prolene   Number of sutures:  9 Approximation:    [place] border: well-aligned      Critical Care performed: No  ____________________________________________   INITIAL IMPRESSION / ASSESSMENT AND PLAN / ED COURSE  Pertinent labs & imaging results that were available during my care of the patient were reviewed by me and considered in my medical decision making (see chart for details).  This is an 82 year old male who comes into the hospital today with a fall. He hit his head and does have some bleeding from his ear as well as his temporal area. We will send the patient for a CT scan of his head and the cervical spine. The patient also has some significant erythema and drainage from his left knee wound. I'm concerned for cellulitis which he said he has been diagnosed with previously. I will send some blood work and give the patient a dose of morphine for his pain. The patient's lacerations will be sutured and he'll be reassessed.  Clinical Course    CT cervical spine and head: No evidence  of acute intracranial abnormality, left temporal scalp and periauricular soft tissue swelling, no evidence of acute cervical spine fracture or subluxation, advanced degenerative disc disease  The patient has no intracranial injury. Although his white count is normal he does have an elevated sedimentation rate. I did contact Dr.Krasinski asked for the patient to be admitted to medicine and he will follow-up with the patient. I will give the patient dose of vancomycin for his injury. I contacted the hospitalist service who will admit the patient. The patient did receive a second dose of morphine but otherwise has no other concerns. He will be admitted to the hospital for evaluation and treatment.  ____________________________________________   FINAL CLINICAL IMPRESSION(S) / ED DIAGNOSES  Final diagnoses:  Cellulitis of left lower extremity  Laceration of scalp, initial encounter  Laceration of left earlobe, initial encounter      NEW MEDICATIONS STARTED DURING THIS VISIT:  Current Discharge Medication List       Note:  This document was prepared using Dragon voice recognition software and may include unintentional dictation errors.    Loney Hering, MD 10/13/16 [id number]

## 2016-10-13 NOTE — ED Notes (Signed)
Patient reports he had gotten up and was using his walker to get around and he fell and struck the left side of his head.  Patient denies loss of consciousness.  Patient with dried blood over left side of head and face.

## 2016-10-13 NOTE — ED Triage Notes (Addendum)
Patient to [facility] 18 from home EMS after fall.  Patient struck left side of head on marble fireplace.  Patient also has open area to left knee.

## 2016-10-13 NOTE — Care Management Important Message (Signed)
Important Message  Patient Details  Name: Kurt Hill MRN: [id number] Date of Birth: 1932-09-06   Medicare Important Message Given:  Yes    Aftin Lye A, RN 10/13/2016, 1:09 PM

## 2016-10-13 NOTE — ED Notes (Addendum)
Pt transported to room 136

## 2016-10-14 ENCOUNTER — Inpatient Hospital Stay: Payer: Medicare Other | Admitting: Anesthesiology

## 2016-10-14 ENCOUNTER — Inpatient Hospital Stay: Payer: Medicare Other

## 2016-10-14 ENCOUNTER — Encounter: Payer: Self-pay | Admitting: *Deleted

## 2016-10-14 ENCOUNTER — Encounter
Admission: EM | Disposition: A | Discharge status: Skilled Nursing Facility | Payer: Self-pay | Source: Home / Self Care | Attending: Internal Medicine

## 2016-10-14 HISTORY — PX: ORIF PATELLA: SHX5033

## 2016-10-14 LAB — CBC
HCT: 30.2 % — ABNORMAL LOW (ref 40.0–52.0)
Hemoglobin: 9.9 g/dL — ABNORMAL LOW (ref 13.0–18.0)
MCH: 30 pg (ref 26.0–34.0)
MCHC: 32.6 g/dL (ref 32.0–36.0)
MCV: 92.1 fL (ref 80.0–100.0)
PLATELETS: 251 10*3/uL (ref 150–440)
RBC: 3.28 MIL/uL — AB (ref 4.40–5.90)
RDW: 15.2 % — AB (ref 11.5–14.5)
WBC: 8 10*3/uL (ref 3.8–10.6)

## 2016-10-14 LAB — CREATININE, SERUM
Creatinine, Ser: 0.92 mg/dL (ref 0.61–1.24)
GFR calc Af Amer: >60 mL/min (ref >60)
GFR calc non Af Amer: >60 mL/min (ref >60)

## 2016-10-14 LAB — HEMOGLOBIN A1C
Hgb A1c MFr Bld: 6 % — ABNORMAL HIGH (ref 4.8–5.6)
Mean Plasma Glucose: 126 mg/dL

## 2016-10-14 SURGERY — OPEN REDUCTION INTERNAL FIXATION (ORIF) PATELLA
Anesthesia: General | Site: Knee | Laterality: Left | Wound class: Contaminated

## 2016-10-14 MED ORDER — IPRATROPIUM-ALBUTEROL 0.5-2.5 (3) MG/3ML IN SOLN
3.0000 mL | Freq: Once | RESPIRATORY_TRACT | Status: AC
  Administered 2016-10-14: 3 mL via RESPIRATORY_TRACT

## 2016-10-14 MED ORDER — EPHEDRINE SULFATE 50 MG/ML IJ SOLN
INTRAMUSCULAR | Status: DC | PRN
Start: 2016-10-14 — End: 2016-10-14
  Administered 2016-10-14 (×2): 10 mg via INTRAVENOUS
  Administered 2016-10-14 (×2): 5 mg via INTRAVENOUS

## 2016-10-14 MED ORDER — METOCLOPRAMIDE HCL 5 MG/ML IJ SOLN: 5.0000 mg | Freq: Three times a day (TID) | INTRAMUSCULAR | Status: DC | PRN

## 2016-10-14 MED ORDER — FENTANYL CITRATE (PF) 100 MCG/2ML IJ SOLN
INTRAMUSCULAR | Status: AC
  Administered 2016-10-14: 25 ug via INTRAVENOUS
  Filled 2016-10-14: qty 2

## 2016-10-14 MED ORDER — IPRATROPIUM-ALBUTEROL 0.5-2.5 (3) MG/3ML IN SOLN
RESPIRATORY_TRACT | Status: AC
  Administered 2016-10-14: 3 mL via RESPIRATORY_TRACT
  Filled 2016-10-14: qty 3

## 2016-10-14 MED ORDER — ONDANSETRON HCL 4 MG/2ML IJ SOLN: 4.0000 mg | Freq: Once | INTRAMUSCULAR | Status: DC | PRN

## 2016-10-14 MED ORDER — PROPOFOL 10 MG/ML IV BOLUS
INTRAVENOUS | Status: DC | PRN
  Administered 2016-10-14: 130 mg via INTRAVENOUS
  Administered 2016-10-14: 50 mg via INTRAVENOUS

## 2016-10-14 MED ORDER — FENTANYL CITRATE (PF) 100 MCG/2ML IJ SOLN
INTRAMUSCULAR | Status: DC | PRN
  Administered 2016-10-14 (×2): 25 ug via INTRAVENOUS
  Administered 2016-10-14: 50 ug via INTRAVENOUS

## 2016-10-14 MED ORDER — ONDANSETRON HCL 4 MG/2ML IJ SOLN
INTRAMUSCULAR | Status: DC | PRN
  Administered 2016-10-14: 4 mg via INTRAVENOUS

## 2016-10-14 MED ORDER — LIDOCAINE HCL (CARDIAC) 20 MG/ML IV SOLN
INTRAVENOUS | Status: DC | PRN
  Administered 2016-10-14: 100 mg via INTRAVENOUS

## 2016-10-14 MED ORDER — GLYCOPYRROLATE 0.2 MG/ML IJ SOLN
INTRAMUSCULAR | Status: DC | PRN
Start: 2016-10-14 — End: 2016-10-14
  Administered 2016-10-14: 0.2 mg via INTRAVENOUS

## 2016-10-14 MED ORDER — FENTANYL CITRATE (PF) 100 MCG/2ML IJ SOLN
25.0000 ug | INTRAMUSCULAR | Status: DC | PRN
  Administered 2016-10-14 (×4): 25 ug via INTRAVENOUS

## 2016-10-14 MED ORDER — VANCOMYCIN HCL 1000 MG IV SOLR
INTRAVENOUS | Status: AC
  Filled 2016-10-14: qty 1000

## 2016-10-14 MED ORDER — ENOXAPARIN SODIUM 40 MG/0.4ML ~~LOC~~ SOLN
40.0000 mg | SUBCUTANEOUS | Status: DC
  Administered 2016-10-15 – 2016-10-21 (×7): 40 mg via SUBCUTANEOUS
  Filled 2016-10-14 (×7): qty 0.4

## 2016-10-14 MED ORDER — DEXMEDETOMIDINE HCL IN NACL 200 MCG/50ML IV SOLN
INTRAVENOUS | Status: DC | PRN
  Administered 2016-10-14: 12 ug via INTRAVENOUS

## 2016-10-14 MED ORDER — VANCOMYCIN HCL 1000 MG IV SOLR
INTRAVENOUS | Status: DC | PRN
  Administered 2016-10-14: 1000 mg

## 2016-10-14 MED ORDER — PHENYLEPHRINE HCL 10 MG/ML IJ SOLN
INTRAMUSCULAR | Status: DC | PRN
  Administered 2016-10-14 (×8): 100 ug via INTRAVENOUS

## 2016-10-14 MED ORDER — NEOMYCIN-POLYMYXIN B GU 40-200000 IR SOLN
Status: DC | PRN
  Administered 2016-10-14: 4 mL

## 2016-10-14 MED ORDER — METOCLOPRAMIDE HCL 10 MG PO TABS: 5.0000 mg | ORAL_TABLET | Freq: Three times a day (TID) | ORAL | Status: DC | PRN

## 2016-10-14 SURGICAL SUPPLY — 48 items
BANDAGE ELASTIC 6 LF NS (GAUZE/BANDAGES/DRESSINGS) IMPLANT
BIT DRILL Q/COUPLING 1 (BIT) ×3 IMPLANT
CANISTER SUCT 1200ML W/VALVE (MISCELLANEOUS) ×3 IMPLANT
CATH IV ANGIO 16GX3.25 GREY (CATHETERS) IMPLANT
CHLORAPREP W/TINT 26ML (MISCELLANEOUS) ×3 IMPLANT
CUFF TOURN 24 STER (MISCELLANEOUS) IMPLANT
CUFF TOURN 30 STER DUAL PORT (MISCELLANEOUS) IMPLANT
DRAPE C-ARM XRAY 36X54 (DRAPES) ×3 IMPLANT
DRAPE C-ARMOR (DRAPES) ×3 IMPLANT
ELECT CAUTERY BLADE 6.4 (BLADE) ×3 IMPLANT
ELECT REM PT RETURN 9FT ADLT (ELECTROSURGICAL) ×3
ELECTRODE REM PT RTRN 9FT ADLT (ELECTROSURGICAL) ×1 IMPLANT
GAUZE PETRO XEROFOAM 1X8 (MISCELLANEOUS) IMPLANT
GAUZE SPONGE 4X4 12PLY STRL (GAUZE/BANDAGES/DRESSINGS) IMPLANT
GLOVE BIOGEL PI IND STRL 9 (GLOVE) ×1 IMPLANT
GLOVE BIOGEL PI INDICATOR 9 (GLOVE) ×2
GLOVE SURG SYN 9.0  PF PI (GLOVE) ×2
GLOVE SURG SYN 9.0 PF PI (GLOVE) ×1 IMPLANT
GOWN SRG 2XL LVL 4 RGLN SLV (GOWNS) ×1 IMPLANT
GOWN STRL NON-REIN 2XL LVL4 (GOWNS) ×2
GOWN STRL REUS W/ TWL LRG LVL3 (GOWN DISPOSABLE) ×1 IMPLANT
GOWN STRL REUS W/TWL LRG LVL3 (GOWN DISPOSABLE) ×2
HANDLE YANKAUER SUCT BULB TIP (MISCELLANEOUS) ×3 IMPLANT
HEMOVAC 400CC 10FR (MISCELLANEOUS) IMPLANT
IMMOB KNEE 24 THIGH 24 443303 (SOFTGOODS) IMPLANT
IV CATH ANGIO 16GX3.25 GREY (CATHETERS)
KIT RM TURNOVER STRD PROC AR (KITS) ×3 IMPLANT
KIT STIMULAN RAPID CURE 5CC (Orthopedic Implant) ×3 IMPLANT
NS IRRIG 500ML POUR BTL (IV SOLUTION) ×3 IMPLANT
PACK TOTAL KNEE (MISCELLANEOUS) ×3 IMPLANT
PAD ABD DERMACEA PRESS 5X9 (GAUZE/BANDAGES/DRESSINGS) IMPLANT
SCREW CANC 32T 6.5 55 (Screw) ×3 IMPLANT
SCREW CORTEX ST 4.5X52 (Screw) IMPLANT
SCREW CORTEX ST 4.5X56 (Screw) IMPLANT
SLEEVE CABLE 2MM VT (Orthopedic Implant) ×3 IMPLANT
STAPLER SKIN PROX 35W (STAPLE) ×3 IMPLANT
STRAP SAFETY BODY (MISCELLANEOUS) ×3 IMPLANT
SUT FIBERWIRE #5 38 CONV BLUE (SUTURE) ×3
SUT ORTHOCORD W/MULTIPK NDL (SUTURE) IMPLANT
SUT STEEL 7 (SUTURE) IMPLANT
SUT VIC AB 0 CT1 27 (SUTURE)
SUT VIC AB 0 CT1 27XCR 8 STRN (SUTURE) IMPLANT
SUT VIC AB 0 CT1 36 (SUTURE) IMPLANT
SUT VIC AB 2-0 CT1 27 (SUTURE) ×4
SUT VIC AB 2-0 CT1 TAPERPNT 27 (SUTURE) ×2 IMPLANT
SUTURE FIBERWR #5 38 CONV BLUE (SUTURE) ×1 IMPLANT
SYRINGE 10CC LL (SYRINGE) ×3 IMPLANT
WASHER FOR 5.0 SCREWS (Washer) ×3 IMPLANT

## 2016-10-14 NOTE — Clinical Social Work Note (Signed)
Clinical Social Work Assessment  Patient Details  Name: Kurt Hill MRN: [id number] Date of Birth: 1932-09-26  Date of referral:  10/14/16               Reason for consult:  Facility Placement, Discharge Planning                Permission sought to share information with:  Facility Art therapist granted to share information::  No  Name::        Agency::     Relationship::     Contact Information:     Housing/Transportation Living arrangements for the past 2 months:  Single Family Home, [place] of Information:  Patient Patient Interpreter Needed:  None Criminal Activity/Legal Involvement Pertinent to Current Situation/Hospitalization:  No - Comment as needed Significant Relationships:  Adult Children, Other Family Members Lives with:  Self, Adult Children Do you feel safe going back to the place where you live?  Yes Need for family participation in patient care:  Yes (Comment)  Care giving concerns:  Patient lives in [place] with his son Kurt Hill who is a Administrator.    Social Worker assessment / plan:  Holiday representative (CSW) received SNF consult. CSW is familiar with patient from previous admissions. Patient has been to [facility] in the past. Patient had surgery today. CSW met with patient and his sister in law Kurt Hill was at bedside. CSW introduced self and explained role of CSW department. Patient reported that he is not going to SNF and described his bad experiences. CSW explained that patient could go to another SNF besides the 2 he has been at. Patient continued to adamantly refused SNF and asked CSW to get out of the room. Patient did agree to home health services and his sister in law requested a transport wheel chair. RN case manager aware of above. CSW will continue to follow and assist as needed.   Employment status:  Retired Forensic scientist:  Medicare PT Recommendations:  Not assessed at this  time Kurt Hill / Referral to community resources:  Other (Comment Required) (Patient adamently refused SNF and wants to go home with home health. )  Patient/Family's Response to care:  Patient refused SNF.   Patient/Family's Understanding of and Emotional Response to Diagnosis, Current Treatment, and Prognosis:  Patient appeared agitated and asked CSW to leave the room. CSW provided emotional support.   Emotional Assessment Appearance:  Appears stated age Attitude/Demeanor/Rapport:  Angry, Complaining Affect (typically observed):  Agitated, Angry Orientation:  Oriented to Self, Oriented to Place, Oriented to  Time, Oriented to Situation Alcohol / Substance use:  Not Applicable Psych involvement (Current and /or in the community):  No (Comment)  Discharge Needs  Concerns to be addressed:  Discharge Planning Concerns Readmission within the last 30 days:  Yes Current discharge risk:  Dependent with Mobility, Chronically ill Barriers to Discharge:  Continued Medical Work up   [employer], Kurt Beets, LCSW 10/14/2016, 4:14 PM

## 2016-10-14 NOTE — Care Management (Signed)
Patient adamant about going home with resumption of care of home health services. He has a wheelchair and is requesting a compact wheelchair but states he thinks someone has already ordered it for him.

## 2016-10-14 NOTE — Op Note (Signed)
10/13/2016 - 10/14/2016  11:48 AM  PATIENT:  Dolores Frame  82 y.o. male  PRE-OPERATIVE DIAGNOSIS:  n/a  POST-OPERATIVE DIAGNOSIS:  wound dehisence and failure of fixation left knee  PROCEDURE:  Procedure(s): OPEN REDUCTION INTERNAL (ORIF) FIXATION PATELLA (Left)  SURGEON: Laurene Footman, MD  ASSISTANTS: None  ANESTHESIA:   general  EBL:  Total I/O In: 1380 [I.V.:1180; IV Piggyback:200] Out: 0   BLOOD ADMINISTERED:none   DRAINS: none wound VAC applied  LOCAL MEDICATIONS USED:  NONE  SPECIMEN:  No Specimen  DISPOSITION OF SPECIMEN:  N/A  COUNTS:  YES  TOURNIQUET:    IMPLANTS: Stimulan antibiotic beads  DICTATION: .Dragon Dictation patient brought the operating room and after adequate anesthesia was obtained the left leg was prepped and draped in sterile fashion. After patient identification and timeout procedures were completed, prior incision was opened. There were sutures from a prior suture anchor which was removed and the prior metal fixation wires were also removed. The knee was then irrigated and clot and scar tissue debrided. The gutters were opened and the antibiotic beads were placed in the gutters. This point a 5 heavy suture was placed circumferentially as a cerclage fixation and after that was it been placed by [place] cable was placed around the proximal patella. The arms of this were brought down distally and a post was placed over a screw hole first using a 45 cortical screw but this did not give adequate fixation was change up to 65 screw with washer after this was set the his bone was quite poor quality and so a staple was added as additional fixation to hold the wire in place proximal distally. After the fixation had been placed provisionally at the distal portion wire was tightened and near anatomic alignment of the fracture was obtained. C-arm views were used during the procedure to make sure the wires for the appropriate location. The staple was then placed  after tightening and cramping the [place]iles sleeve. The knee could be flexed about 30 because of tension in the quadriceps. This was not forced beyond this level. The 5 suture was then tightened as a backup to the [place]. The wound was thoroughly irrigated and closed with 2-0 Vicryl subcutaneously and skin staples followed by appropriate wound VAC.  PLAN OF CARE: Continue as inpatient  PATIENT DISPOSITION:  PACU - hemodynamically stable.

## 2016-10-14 NOTE — NC FL2 (Signed)
[place] LEVEL OF CARE SCREENING TOOL     IDENTIFICATION  Patient Name: Kurt Hill Birthdate: 12/31/1931 Sex: male Admission Date (Current Location): 10/13/2016  [place] and [place] Number:  Engineering geologist and Address:  [facility], [address], [place], [place] [address]      Provider Number: [id number]  Attending Physician Name and Address:  Epifanio Lesches, MD  Relative Name and Phone Number:       Current Level of Care: Hospital Recommended Level of Care: [place] Prior Approval Number:    Date Approved/Denied:   PASRR Number:  ( [id number] )  Discharge Plan: SNF    Current Diagnoses: Patient Active Problem List   Diagnosis Date Noted  . Pressure injury of skin 10/13/2016  . Cellulitis of left leg 08/30/2016  . Post op infection 08/30/2016  . Cellulitis 08/30/2016  . Left patella fracture 08/15/2016  . Patella fracture 07/30/2016  . COPD exacerbation ([place]) 12/14/2015    Orientation RESPIRATION BLADDER Height & Weight     Self, Time, Situation, Place  O2 (2 Liters Oxygen ) Continent Weight: 151 lb (68.5 kg) Height:  5\' 10"  (177.8 cm)  BEHAVIORAL SYMPTOMS/MOOD NEUROLOGICAL BOWEL NUTRITION STATUS   (none)  (none ) Continent Diet (Diet: Clear Liquid. )  AMBULATORY STATUS COMMUNICATION OF NEEDS Skin   Extensive Assist Verbally PU Stage and Appropriate Care, Other (Comment), Surgical wounds (Unstageable on left heel. Head and Left ear Laceration. Incision: Left Knee. )  Wound Vac                     Personal Care Assistance Level of Assistance  Bathing, Feeding, Dressing Bathing Assistance: Limited assistance Feeding assistance: Independent Dressing Assistance: Limited assistance     Functional Limitations Info  Sight, Hearing, Speech Sight Info: Adequate Hearing Info: Adequate Speech Info: Adequate    SPECIAL CARE FACTORS FREQUENCY  PT (By licensed PT), OT (By licensed OT)      PT Frequency:  (5) OT Frequency:  (5)            Contractures      Additional Factors Info  Code Status, Allergies, Isolation Precautions Code Status Info:  (Full Code. ) Allergies Info:  (Ivp Dye Iodinated Diagnostic Agents, Sulfa Antibiotics)     Isolation Precautions Info:  (MRSA Nasal Swab. )     Current Medications (10/14/2016):  This is the current hospital active medication list Current Facility-Administered Medications  Medication Dose Route Frequency Provider Last Rate Last Dose  . 0.9 %  sodium chloride infusion   Intravenous Continuous Dustin Flock, MD 100 mL/hr at 10/14/16 1331    . acetaminophen (TYLENOL) tablet 650 mg  650 mg Oral Q6H PRN Harrie Foreman, MD   650 mg at 10/13/16 2149   Or  . acetaminophen (TYLENOL) suppository 650 mg  650 mg Rectal Q6H PRN Harrie Foreman, MD      . albuterol (PROVENTIL) (2.5 MG/3ML) 0.083% nebulizer solution 2.5 mg  2.5 mg Nebulization Q4H PRN Harrie Foreman, MD      . aspirin EC tablet 81 mg  81 mg Oral q morning - 10a Harrie Foreman, MD   Stopped at 10/14/16 1000  . collagenase (SANTYL) ointment   Topical Daily Albertine Patricia, DPM   Stopped at 10/14/16 1000  . docusate sodium (COLACE) capsule 100 mg  100 mg Oral BID Harrie Foreman, MD   Stopped at 10/14/16 1000  . [START ON  10/15/2016] enoxaparin (LOVENOX) injection 40 mg  40 mg Subcutaneous Q24H Hessie Knows, MD      . finasteride (PROSCAR) tablet 5 mg  5 mg Oral Daily Harrie Foreman, MD   Stopped at 10/14/16 1000  . lactulose (CHRONULAC) 10 GM/15ML solution 30 g  30 g Oral Daily PRN Harrie Foreman, MD      . levothyroxine (SYNTHROID, LEVOTHROID) tablet 150 mcg  150 mcg Oral QAC breakfast Hillary Bow, MD   150 mcg at 10/14/16 0834  . metoCLOPramide (REGLAN) tablet 5-10 mg  5-10 mg Oral Q8H PRN Hessie Knows, MD       Or  . metoCLOPramide (REGLAN) injection 5-10 mg  5-10 mg Intravenous Q8H PRN Hessie Knows, MD      . mometasone-formoterol [facility]) 100-5  MCG/ACT inhaler 2 puff  2 puff Inhalation BID Harrie Foreman, MD   2 puff at 10/14/16 [phone]  . morphine 2 MG/ML injection 2 mg  2 mg Intravenous Q3H PRN Harrie Foreman, MD      . mupirocin ointment (BACTROBAN) 2 % 1 application  1 application Nasal BID Harrie Foreman, MD   1 application at [phone]  . ondansetron (ZOFRAN) tablet 4 mg  4 mg Oral Q6H PRN Harrie Foreman, MD       Or  . ondansetron [facility]) injection 4 mg  4 mg Intravenous Q6H PRN Harrie Foreman, MD      . oxyCODONE (Oxy IR/ROXICODONE) immediate release tablet 5-10 mg  5-10 mg Oral Q3H PRN Harrie Foreman, MD   5 mg at 10/13/16 0802  . sodium chloride flush (NS) 0.9 % injection 3 mL  3 mL Intravenous Q12H Harrie Foreman, MD   Stopped at 10/14/16 1000  . vancomycin (VANCOCIN) IVPB 1000 mg/200 mL premix  1,000 mg Intravenous Q18H Harrie Foreman, MD   1,000 mg at 10/14/16 [id number]  . Vitamin D (Ergocalciferol) (DRISDOL) capsule 50,000 Units  50,000 Units Oral Q7 days Harrie Foreman, MD   50,000 Units at 10/13/16 [id number]     Discharge Medications: Please see discharge summary for a list of discharge medications.  Relevant Imaging Results:  Relevant Lab Results:   Additional Information  (SSN: [id number])  Tysheka Fanguy, [name], LCSW

## 2016-10-14 NOTE — Transfer of Care (Signed)
Immediate Anesthesia Transfer of Care Note  Patient: Kurt Hill  Procedure(s) Performed: Procedure(s): OPEN REDUCTION INTERNAL (ORIF) FIXATION PATELLA (Left)  Patient Location: PACU  Anesthesia Type:General  Level of Consciousness: awake, alert  and oriented  Airway & Oxygen Therapy: Patient connected to face mask oxygen  Post-op Assessment: Post -op Vital signs reviewed and stable  Post vital signs: stable  Last Vitals:  Vitals:   10/14/16 0904 10/14/16 1144  BP: 119/77 (!) 92/50  Pulse: 70 72  Resp: 17 20  Temp: 36.6 C 36.7 C    Last Pain:  Vitals:   10/14/16 1144  TempSrc: Temporal  PainSc:       Patients Stated Pain Goal: 3 ([phone]  Complications: No apparent anesthesia complications

## 2016-10-14 NOTE — Anesthesia Preprocedure Evaluation (Signed)
Anesthesia Evaluation  Patient identified by MRN, date of birth, ID band Patient awake    Reviewed: Allergy & Precautions, H&P , NPO status , Patient's Chart, lab work & pertinent test results, reviewed documented beta blocker date and time   Airway Mallampati: II  TM Distance: >3 FB Neck ROM: full    Dental  (+) Teeth Intact   Pulmonary neg pulmonary ROS, asthma , COPD,    Pulmonary exam normal        Cardiovascular Exercise Tolerance: Good + CAD  negative cardio ROS Normal cardiovascular exam Rate:Normal     Neuro/Psych negative neurological ROS  negative psych ROS   GI/Hepatic negative GI ROS, Neg liver ROS,   Endo/Other  negative endocrine ROSHypothyroidism   Renal/GU negative Renal ROS  negative genitourinary   Musculoskeletal   Abdominal   Peds  Hematology negative hematology ROS (+) anemia ,   Anesthesia Other Findings   Reproductive/Obstetrics negative OB ROS                             Anesthesia Physical Anesthesia Plan  ASA: III  Anesthesia Plan: General LMA   Post-op Pain Management:    Induction:   Airway Management Planned:   Additional Equipment:   Intra-op Plan:   Post-operative Plan:   Informed Consent: I have reviewed the patients History and Physical, chart, labs and discussed the procedure including the risks, benefits and alternatives for the proposed anesthesia with the patient or authorized representative who has indicated his/her understanding and acceptance.     Plan Discussed with: CRNA  Anesthesia Plan Comments:         Anesthesia Quick Evaluation

## 2016-10-14 NOTE — Progress Notes (Signed)
Patient seen at bedside. He has had lost fixation and a fall with apparently his extensor mechanism not being followed adequately function along to ambulate well. Because of this my recommendation is to do a repair with Youlanda Mighty cable around the proximal pole of patella and attached that to his tibia with a screw as a post. This should allow him to do immediate weightbearing and range of motion active not passive. Additionally any hardware that is visible will be removed

## 2016-10-14 NOTE — Progress Notes (Signed)
Patient dressing changed on right heel. RN cleansed wound with saline and applied wet to dry dressing. Also applied santyl to eschar areas. Heel protection boot applied.   Deri Fuelling, RN

## 2016-10-14 NOTE — Anesthesia Procedure Notes (Signed)
Procedure Name: LMA Insertion Date/Time: 10/14/2016 9:46 AM Performed by: Aline Brochure Pre-anesthesia Checklist: Patient identified, Emergency Drugs available, Suction available and Patient being monitored Patient Re-evaluated:Patient Re-evaluated prior to inductionOxygen Delivery Method: Circle system utilized Preoxygenation: Pre-oxygenation with 100% oxygen Intubation Type: IV induction Ventilation: Mask ventilation with difficulty LMA: LMA inserted LMA Size: 5.0 Number of attempts: 2 Placement Confirmation: positive ETCO2 and breath sounds checked- equal and bilateral Tube secured with: Tape Dental Injury: Teeth and Oropharynx as per pre-operative assessment

## 2016-10-14 NOTE — Progress Notes (Signed)
[place] [place] NAME: Kurt Hill    MR#:  [id number]  DATE OF BIRTH:  1932/10/28  SUBJECTIVE: went for knee operation.  CHIEF COMPLAINT:   Chief Complaint  Patient presents with  . Fall  . Ear Laceration   Admitted after a fall. Found to have left knee postop infection. Left heel ulcer. Afebrile.  REVIEW OF SYSTEMS:    Review of Systems  Constitutional: Positive for malaise/fatigue. Negative for chills and fever.  HENT: Negative for sore throat.   Eyes: Negative for blurred vision, double vision and pain.  Respiratory: Negative for cough, hemoptysis, shortness of breath and wheezing.   Cardiovascular: Negative for chest pain, palpitations, orthopnea and leg swelling.  Gastrointestinal: Negative for abdominal pain, constipation, diarrhea, heartburn, nausea and vomiting.  Genitourinary: Negative for dysuria and hematuria.  Musculoskeletal: Positive for back pain, falls and joint pain.  Skin: Negative for rash.  Neurological: Positive for weakness. Negative for sensory change, speech change, focal weakness and headaches.  Endo/Heme/Allergies: Does not bruise/bleed easily.  Psychiatric/Behavioral: Negative for depression. The patient is not nervous/anxious.     DRUG ALLERGIES:   Allergies  Allergen Reactions  . Ivp Dye [Iodinated Diagnostic Agents] Itching and Rash  . Sulfa Antibiotics Rash    VITALS:  Blood pressure (!) 89/55, pulse 76, temperature 98 F (36.7 C), temperature source Temporal, resp. rate 15, height 5\' 10"  (1.778 m), weight 68.5 kg (151 lb), SpO2 96 %.  PHYSICAL EXAMINATION:   Physical Exam  GENERAL:  82 y.o.-year-old patient lying in the bed with no acute distress.  EYES: Pupils equal, round, reactive to light and accommodation. No scleral icterus. Extraocular muscles intact.  LUNGS: Normal breath sounds bilaterally, no wheezing, rales, rhonchi. No use of accessory muscles of respiration.  CARDIOVASCULAR:  S1, S2 normal. No murmurs, rubs, or gallops.  ABDOMEN: Soft, nontender, nondistended. Bowel sounds present. No organomegaly or mass.  EXTREMITIES: No cyanosis, clubbing or edema b/l.    PSYCHIATRIC: The patient is alert and oriented x 3.   Left knee wound and left heel ulcer and a dressing  LABORATORY PANEL:   CBC  Recent Labs Lab 10/13/16 0142  WBC 8.5  HGB 10.8*  HCT 32.0*  PLT 318   ------------------------------------------------------------------------------------------------------------------ Chemistries   Recent Labs Lab 10/13/16 0142  NA 134*  K 4.3  CL 96*  CO2 29  GLUCOSE 138*  BUN 23*  CREATININE 1.20  CALCIUM 9.1   ------------------------------------------------------------------------------------------------------------------  Cardiac Enzymes No results for input(s): TROPONINI in the last 168 hours. ------------------------------------------------------------------------------------------------------------------  RADIOLOGY:  Dg Knee 1-2 Views Left  Result Date: 10/14/2016 CLINICAL DATA:  Patellar repair . EXAM: LEFT KNEE - 1-2 VIEW; DG C-ARM 61-120 MIN COMPARISON:  08/15/2016. FINDINGS: Postsurgical changes noted of the patella. Right radiopacity is noted over the suprapatellar space. Orthopedic hardware intact . 2 images. 1 minutes 18 seconds fluoroscopy time. IMPRESSION: Postsurgical changes left patella. Electronically Signed   By: Marcello Moores  Register   On: 10/14/2016 11:35   Ct Head Wo Contrast  Result Date: 10/13/2016 CLINICAL DATA:  Fall from wheelchair. Hit left side of head. Head pain. Initial encounter. EXAM: CT HEAD WITHOUT CONTRAST CT CERVICAL SPINE WITHOUT CONTRAST TECHNIQUE: Multidetector CT imaging of the head and cervical spine was performed following the standard protocol without intravenous contrast. Multiplanar CT image reconstructions of the cervical spine were also generated. COMPARISON:  Head and cervical spine CT 12/23/2014 FINDINGS: CT  HEAD FINDINGS Brain: There is no evidence of acute  cortical infarct, intracranial hemorrhage, mass, midline shift, or extra-axial fluid collection. Mild cerebral atrophy is unchanged. Cerebral white matter hypodensities are unchanged and compatible with moderate chronic small vessel ischemic disease. Vascular: Calcified atherosclerosis at the skullbase. No hyperdense vessel. Skull: No fracture or focal osseous lesion. Sinuses/Orbits: The visualized paranasal sinuses and mastoid air cells are clear. Visualized orbits are unremarkable. Other: Left temporal scalp and periauricular soft tissue swelling and gas consistent with history of laceration. CT CERVICAL SPINE FINDINGS Alignment: Cervical spine straightening.  No subluxation. Skull base and vertebrae: No acute fracture. Soft tissues and spinal canal: No prevertebral fluid or swelling. No visible canal hematoma. Disc levels: Multilevel disc degeneration, advanced from C5-6 to C7-T1 with degenerative endplate changes and Schmorl's nodes including a large Schmorl's node involving the C7 superior endplate, unchanged. Broad-based posterior disc osteophyte complex at C5-6 results in mild spinal stenosis. Upper chest: Mild scarring in the lung apices. Other: None. IMPRESSION: 1. No evidence of acute intracranial abnormality. 2. Left temporal scalp and periauricular soft tissue swelling. 3. No evidence of acute cervical spine fracture or subluxation. Advanced disc degeneration. Electronically Signed   By: Logan Bores M.D.   On: 10/13/2016 07:38   Ct Cervical Spine Wo Contrast  Result Date: 10/13/2016 CLINICAL DATA:  Fall from wheelchair. Hit left side of head. Head pain. Initial encounter. EXAM: CT HEAD WITHOUT CONTRAST CT CERVICAL SPINE WITHOUT CONTRAST TECHNIQUE: Multidetector CT imaging of the head and cervical spine was performed following the standard protocol without intravenous contrast. Multiplanar CT image reconstructions of the cervical spine were also  generated. COMPARISON:  Head and cervical spine CT 12/23/2014 FINDINGS: CT HEAD FINDINGS Brain: There is no evidence of acute cortical infarct, intracranial hemorrhage, mass, midline shift, or extra-axial fluid collection. Mild cerebral atrophy is unchanged. Cerebral white matter hypodensities are unchanged and compatible with moderate chronic small vessel ischemic disease. Vascular: Calcified atherosclerosis at the skullbase. No hyperdense vessel. Skull: No fracture or focal osseous lesion. Sinuses/Orbits: The visualized paranasal sinuses and mastoid air cells are clear. Visualized orbits are unremarkable. Other: Left temporal scalp and periauricular soft tissue swelling and gas consistent with history of laceration. CT CERVICAL SPINE FINDINGS Alignment: Cervical spine straightening.  No subluxation. Skull base and vertebrae: No acute fracture. Soft tissues and spinal canal: No prevertebral fluid or swelling. No visible canal hematoma. Disc levels: Multilevel disc degeneration, advanced from C5-6 to C7-T1 with degenerative endplate changes and Schmorl's nodes including a large Schmorl's node involving the C7 superior endplate, unchanged. Broad-based posterior disc osteophyte complex at C5-6 results in mild spinal stenosis. Upper chest: Mild scarring in the lung apices. Other: None. IMPRESSION: 1. No evidence of acute intracranial abnormality. 2. Left temporal scalp and periauricular soft tissue swelling. 3. No evidence of acute cervical spine fracture or subluxation. Advanced disc degeneration. Electronically Signed   By: Logan Bores M.D.   On: 10/13/2016 07:38   Dg Knee Complete 4 Views Left  Result Date: 10/13/2016 CLINICAL DATA:  Patient reports he had gotten up and was using his walker to get around and he fell and struck the left side of his head and knee EXAM: LEFT KNEE - COMPLETE 4+ VIEW COMPARISON:  08/15/2016 FINDINGS: Postoperative changes with wire and screw fixation of a transverse fracture of the  patella. Since the previous study, the patellar fracture fragments have re- separated with significant distraction of fracture fragments now identified, leading about 3.8 cm gap. Small effusion and patellar soft tissue swelling. Diffuse bone demineralization. No destructive bone lesions.  IMPRESSION: Re- fracture of patella despite internal fixation with distraction of fracture fragments leaving about 3.8 cm gap. Electronically Signed   By: Lucienne Capers M.D.   On: 10/13/2016 22:22   Dg C-arm 61-120 Min  Result Date: 10/14/2016 CLINICAL DATA:  Patellar repair . EXAM: LEFT KNEE - 1-2 VIEW; DG C-ARM 61-120 MIN COMPARISON:  08/15/2016. FINDINGS: Postsurgical changes noted of the patella. Right radiopacity is noted over the suprapatellar space. Orthopedic hardware intact . 2 images. 1 minutes 18 seconds fluoroscopy time. IMPRESSION: Postsurgical changes left patella. Electronically Signed   By: Marcello Moores  Register   On: 10/14/2016 11:35     ASSESSMENT AND PLAN:   This is an 82 year old male admitted for recurrent cellulitis.  1. Left knee wound daily since, hardware infection: Status post surgery, open reduction and fixation, left patella. Antibiotic beads placed  Inside knee .  # Left heel ulcer Podiatry consulted.wound care consult , needs a pressure relieving mattress, operative dressing wet-to-dry with santyl.change dressign daily  2. COPD: Stable; continue inhaled corticosteroid and albuterol as needed  3. Hypothyroidism: Continue Synthroid   4. BPH: Continue Proscar  All the records are reviewed and case discussed with Care Management/Social Workerr. Management plans discussed with the patient, family and they are in agreement.  CODE STATUS: FULL  DVT Prophylaxis: SCDs  TOTAL TIME TAKING CARE OF THIS PATIENT:30 minutes.   POSSIBLE D/C IN 3-4 DAYS, DEPENDING ON CLINICAL CONDITION.  Epifanio Lesches M.D on 10/14/2016 at 1:01 PM  Between 7am to 6pm - Pager - [phone]  After  6pm go to [email or web address] - password [email or web address] [facility]  Office  [phone]  CC: Primary care physician; Tracie Harrier, MD  Note: This dictation was prepared with Dragon dictation along with smaller phrase technology. Any transcriptional errors that result from this process are unintentional.

## 2016-10-15 LAB — SURGICAL PATHOLOGY

## 2016-10-15 MED ORDER — POLYETHYLENE GLYCOL 3350 17 G PO PACK
17.0000 g | PACK | Freq: Every day | ORAL | Status: DC
  Administered 2016-10-15 – 2016-10-21 (×7): 17 g via ORAL
  Filled 2016-10-15 (×7): qty 1

## 2016-10-15 MED ORDER — ENSURE ENLIVE PO LIQD
237.0000 mL | Freq: Two times a day (BID) | ORAL | Status: DC
  Administered 2016-10-15 – 2016-10-21 (×11): 237 mL via ORAL

## 2016-10-15 NOTE — Progress Notes (Signed)
[facility] NAME: Kurt Hill    MR#:  [id number]  DATE OF BIRTH:  06-Jun-1932  SUBJECTIVE: And complains of pain in the left knee, left heel.  Dictating the dressing changes for the left heel ulcer. Wound VAC canister change it 2 times during last night shift. Patient  Says that  absolutely does not want to go to nursing home he says that nursing home staff ignored his left heel ulcer.   CHIEF COMPLAINT:   Chief Complaint  Patient presents with  . Fall  . Ear Laceration     REVIEW OF SYSTEMS:    Review of Systems  Constitutional: Positive for malaise/fatigue. Negative for chills and fever.  HENT: Negative for sore throat.   Eyes: Negative for blurred vision, double vision and pain.  Respiratory: Negative for cough, hemoptysis, shortness of breath and wheezing.   Cardiovascular: Negative for chest pain, palpitations, orthopnea and leg swelling.  Gastrointestinal: Negative for abdominal pain, constipation, diarrhea, heartburn, nausea and vomiting.  Genitourinary: Negative for dysuria and hematuria.  Musculoskeletal: Positive for back pain, falls and joint pain.  Skin: Negative for rash.  Neurological: Positive for weakness. Negative for sensory change, speech change, focal weakness and headaches.  Endo/Heme/Allergies: Does not bruise/bleed easily.  Psychiatric/Behavioral: Negative for depression. The patient is not nervous/anxious.     DRUG ALLERGIES:   Allergies  Allergen Reactions  . Ivp Dye [Iodinated Diagnostic Agents] Itching and Rash  . Sulfa Antibiotics Rash    VITALS:  Blood pressure (!) 106/56, pulse 92, temperature 98.4 F (36.9 C), temperature source Oral, resp. rate 18, height 5\' 10"  (1.778 m), weight 74.2 kg (163 lb 9.6 oz), SpO2 (!) 86 %.  PHYSICAL EXAMINATION:   Physical Exam  GENERAL:  82 y.o.-year-old patient lying in the bed with no acute distress.  EYES: Pupils equal, round, reactive to light and  accommodation. No scleral icterus. Extraocular muscles intact.  LUNGS: Normal breath sounds bilaterally, no wheezing, rales, rhonchi. No use of accessory muscles of respiration.  CARDIOVASCULAR: S1, S2 normal. No murmurs, rubs, or gallops.  ABDOMEN: Soft, nontender, nondistended. Bowel sounds present. No organomegaly or mass.  EXTREMITIES: The knee wound VAC present left heel black eschar present reviewed this wound with register nurse while she was doing dressing changes. PSYCHIATRIC: The patient is alert and oriented x 3.   Left knee wound and left heel ulcer and a dressing  LABORATORY PANEL:   CBC  Recent Labs Lab 10/14/16 1341  WBC 8.0  HGB 9.9*  HCT 30.2*  PLT 251   ------------------------------------------------------------------------------------------------------------------ Chemistries   Recent Labs Lab 10/13/16 0142 10/14/16 1341  NA 134*  --   K 4.3  --   CL 96*  --   CO2 29  --   GLUCOSE 138*  --   BUN 23*  --   CREATININE 1.20 0.92  CALCIUM 9.1  --    ------------------------------------------------------------------------------------------------------------------  Cardiac Enzymes No results for input(s): TROPONINI in the last 168 hours. ------------------------------------------------------------------------------------------------------------------  RADIOLOGY:  Dg Knee 1-2 Views Left  Result Date: 10/14/2016 CLINICAL DATA:  Patellar repair . EXAM: LEFT KNEE - 1-2 VIEW; DG C-ARM 61-120 MIN COMPARISON:  08/15/2016. FINDINGS: Postsurgical changes noted of the patella. Right radiopacity is noted over the suprapatellar space. Orthopedic hardware intact . 2 images. 1 minutes 18 seconds fluoroscopy time. IMPRESSION: Postsurgical changes left patella. Electronically Signed   By: Marcello Moores  Register   On: 10/14/2016 11:35   Dg C-arm 61-120 Min  Result Date: 10/14/2016 CLINICAL DATA:  Patellar repair . EXAM: LEFT KNEE - 1-2 VIEW; DG C-ARM 61-120 MIN COMPARISON:   08/15/2016. FINDINGS: Postsurgical changes noted of the patella. Right radiopacity is noted over the suprapatellar space. Orthopedic hardware intact . 2 images. 1 minutes 18 seconds fluoroscopy time. IMPRESSION: Postsurgical changes left patella. Electronically Signed   By: Marcello Moores  Register   On: 10/14/2016 11:35     ASSESSMENT AND PLAN:   This is an 82 year old male admitted for recurrent cellulitis.  1. Left knee wound daily since, hardware infection: Status post surgery, open reduction and fixation, left patella. Antibiotic beads placed  Inside knee . Continue the wound VAC, nursing staff recommended that larger wound VAC because of a lot of drainage requiring  To  empty the canister 2 times last night  # Left heel ulcer Podiatry consulted.wound care consult , needs a pressure relieving mattress, operative dressing wet-to-dry with santyl.change dressign daily Consult the wound care nurse today. 2. COPD: Stable; continue inhaled corticosteroid and albuterol as needed  3. Hypothyroidism: Continue Synthroid   4. BPH: Continue Proscar Number hypoxia secondary to poor circulation, now has pulseox on nose, O2 saturations  Are more than 95% as per RN,but not documented yet.  All the records are reviewed and case discussed with Care Management/Social Workerr. Management plans discussed with the patient, family and they are in agreement.  CODE STATUS: FULL  DVT Prophylaxis: SCDs  TOTAL TIME TAKING CARE OF THIS PATIENT:30 minutes.   POSSIBLE D/C IN 3-4 DAYS, DEPENDING ON CLINICAL CONDITION.  Epifanio Lesches M.D on 10/15/2016 at 10:55 AM  Between 7am to 6pm - Pager - [phone]  After 6pm go to [email or web address] - password [email or web address] [facility]  Office  [phone]  CC: Primary care physician; Tracie Harrier, MD  Note: This dictation was prepared with Dragon dictation along with smaller phrase technology. Any transcriptional errors that result from this process  are unintentional.

## 2016-10-15 NOTE — Progress Notes (Signed)
Pharmacy Antibiotic Note  Kurt Hill is a 82 y.o. male admitted on 10/13/2016 with cellulitis.  Pharmacy has been consulted for vancomycin dosing.  Plan: Vancomycin 1 g IV every 18 hours.  Goal trough 10-15 mcg/mL.  Trough level ordered prior to 5th dose on 11/7 at 21:30. Improvement in renal function but all doses before ordered trough charted as given. Will obtain vanc trough and adjust accordingly.    Height: 5\' 10"  (177.8 cm) Weight: 163 lb 9.6 oz (74.2 kg) IBW/kg (Calculated) : 73  Temp (24hrs), Avg:98.1 F (36.7 C), Min:97.5 F (36.4 C), Max:98.5 F (36.9 C)   Recent Labs Lab 10/13/16 0142 10/14/16 1341  WBC 8.5 8.0  CREATININE 1.20 0.92    Estimated Creatinine Clearance: 63.9 mL/min (by C-G formula based on SCr of 0.92 mg/dL).    Allergies  Allergen Reactions  . Ivp Dye [Iodinated Diagnostic Agents] Itching and Rash  . Sulfa Antibiotics Rash    Antimicrobials this admission: Vancomycin 11/5 >>     Dose adjustments this admission: N/A  Microbiology results: 11/5 BCx: NGTD  Thank you for allowing pharmacy to be a part of this patient's care.  Rocky Morel, RPh Clinical Pharmacist 10/15/2016 8:41 AM

## 2016-10-15 NOTE — Progress Notes (Signed)
PT is recommending SNF. Clinical Social Worker (CSW) met with patient for a second time to discuss SNF option. Patient adamantly refused SNF and reported that he is going home. RN case manager is aware of above. Clinical Social Worker (CSW) will continue to follow and assist as needed.   [employer], LCSW [phone]

## 2016-10-15 NOTE — Progress Notes (Signed)
Changed Prevena wound vac canister x 2 this shift.

## 2016-10-15 NOTE — Progress Notes (Signed)
Pulse ox was reading low on the dynamap. Verified second reading with portable pulse oximeter. Patient denies any shortness of breath. Finger tips are cold and slightly cyanotic. Reading may not be accurate due to circulatory compromise.

## 2016-10-15 NOTE — Progress Notes (Signed)
Pharmacy Antibiotic Note  Kurt Hill is a 82 y.o. male admitted on 10/13/2016 with cellulitis.  Pharmacy has been consulted for vancomycin dosing.  Plan: Vancomycin trough scheduled and planned for 10/15/2016 at 2130. Dose administered and documented 10/15/2016 at 2050. Spoke with lab, sample collected 10/15/2016 at 2140, 50 minutes after dose began. They will credit the lab, and trough rescheduled for tomorrow before dose.   Height: 5\' 10"  (177.8 cm) Weight: 163 lb 9.6 oz (74.2 kg) IBW/kg (Calculated) : 73  Temp (24hrs), Avg:98.4 F (36.9 C), Min:97.8 F (36.6 C), Max:98.8 F (37.1 C)   Recent Labs Lab 10/13/16 0142 10/14/16 1341  WBC 8.5 8.0  CREATININE 1.20 0.92    Estimated Creatinine Clearance: 63.9 mL/min (by C-G formula based on SCr of 0.92 mg/dL).    Allergies  Allergen Reactions  . Ivp Dye [Iodinated Diagnostic Agents] Itching and Rash  . Sulfa Antibiotics Rash    Antimicrobials this admission: Vancomycin 11/5 >>     Dose adjustments this admission: N/A  Microbiology results: 11/5 BCx: NGTD  Thank you for allowing pharmacy to be a part of this patient's care.  Laural Benes, Pharm.D., BCPS Clinical Pharmacist 10/15/2016 9:47 PM

## 2016-10-15 NOTE — Progress Notes (Signed)
   Subjective: 1 Day Post-Op Procedure(s) (LRB): OPEN REDUCTION INTERNAL (ORIF) FIXATION PATELLA (Left) Patient reports pain as moderate.   Patient is well, and has had no acute complaints or problems. Weak with standing. Denies any CP, SOB, ABD pain. We will continue therapy today.  Plan is to go Skilled nursing facility after hospital stay.  Objective: Vital signs in last 24 hours: Temp:  [97.5 F (36.4 C)-98.5 F (36.9 C)] 98.4 F (36.9 C) (11/07 0736) Pulse Rate:  [70-92] 92 (11/07 0729) Resp:  [13-20] 18 (11/07 0416) BP: (85-109)/(46-73) 106/56 (11/07 0729) SpO2:  [83 %-100 %] 86 % (11/07 0752) Weight:  [74.2 kg (163 lb 9.6 oz)] 74.2 kg (163 lb 9.6 oz) (11/07 0503)  Intake/Output from previous day: 11/06 0701 - 11/07 0700 In: 4058.3 [P.O.:520; I.V.:2038.3; IV Piggyback:600] Out: 90 [Drains:90] Intake/Output this shift: No intake/output data recorded.   Recent Labs  10/13/16 0142 10/14/16 1341  HGB 10.8* 9.9*    Recent Labs  10/13/16 0142 10/14/16 1341  WBC 8.5 8.0  RBC 3.49* 3.28*  HCT 32.0* 30.2*  PLT 318 251    Recent Labs  10/13/16 0142 10/14/16 1341  NA 134*  --   K 4.3  --   CL 96*  --   CO2 29  --   BUN 23*  --   CREATININE 1.20 0.92  GLUCOSE 138*  --   CALCIUM 9.1  --    No results for input(s): LABPT, INR in the last 72 hours.  EXAM General - Patient is Alert, Appropriate and Oriented Extremity - Neurovascular intact Sensation intact distally Intact pulses distally Dorsiflexion/Plantar flexion intact No cellulitis present Compartment soft  Left heel ulcer with intact dressing. Dressing - dressing C/D/I. Wound vac intact to left knee. Canister 40 % full. Motor Function - intact, moving foot and toes well on exam.   Past Medical History:  Diagnosis Date  . Anemia   . Asthma   . BPH (benign prostatic hyperplasia)   . COPD (chronic obstructive pulmonary disease) (HCC)    not on home oxygen  . Coronary artery disease   .  Hypotension   . Hypothyroidism   . Skin abnormalities   . Skin cancer     Assessment/Plan:   1 Day Post-Op Procedure(s) (LRB): OPEN REDUCTION INTERNAL (ORIF) FIXATION PATELLA (Left) Active Problems:   Cellulitis   Pressure injury of skin  Estimated body mass index is 23.47 kg/m as calculated from the following:   Height as of this encounter: 5\' 10"  (1.778 m).   Weight as of this encounter: 74.2 kg (163 lb 9.6 oz). Advance diet Up with therapy, WBAT LLE, active ROM only. NO PASSIVE ROM. Continue with wound vac. Drainage appears to be decreasing. Will continue to monitor Continue with IV abx   DVT Prophylaxis - Lovenox, Foot Pumps and TED hose Weight-Bearing as tolerated to left leg   T. Rachelle Hora, PA-C [place] 10/15/2016, 12:35 PM

## 2016-10-15 NOTE — Evaluation (Signed)
Physical Therapy Evaluation Patient Details Name: Kurt Hill MRN: [id number] DOB: Apr 17, 1932 Today's Date: 10/15/2016   History of Present Illness  Pt is a 82 y/o M who presented to the ED after a mechanical fall.  He has had two previous revisions of his ORIF L patella due to recurrent infection.  Pt was found to have dehiscence of his surgical incision and a severe laceration to his L ear.  Due to his fall he lost fixation of L patella and is now s/p ORIF L patella (11/6).  Additionally, pt has a sore on his posterior L heel.  Pt's PMH includes COPD, hypothyroidism, skin cancer, ORIF L patella (07/30/16 and 08/15/16).    Clinical Impression  Patient is s/p above surgery resulting in functional limitations due to the deficits listed below (see PT Problem List). Mr. Vanderpoel presents agitated and requires max encouragement to participate in therapy, pt agreeable.  Unable to attain all PLOF and home layout information as pt is very talkative and difficulty to remain focused.  Pt fixated on and consumed by his weakness, repeating and shouting, "I am too weak!!!!" and repeatedly asking this PT to give him an Ensure.  RN already aware and is waiting on MD approval, pt aware of this. Pt completed AROM and strengthening exercises for LLE with increased time and effort and requires max +2 assist for bed mobility.  Min +2 assist provided to pt to stand from bed but after standing, pt becomes very anxious and shouting, "I'm going to fall!! You better let me sit!!" and requires mod assist to steady.  Pt refuses to attempt stand pivot transfer at this time.  Recommending SNF at d/c. Patient will benefit from skilled PT to increase their independence and safety with mobility to allow discharge to the venue listed below.      Follow Up Recommendations SNF    Equipment Recommendations  None recommended by PT    Recommendations for Other Services OT consult     Precautions / Restrictions Precautions Precautions:  Fall;Other (comment) Precaution Comments: No PROM of knee, AROM only.  There is a KI in the pt's room but spoke with Dr. Rudene Christians on the phone who confirmed pt does not need to use KI.  Spoke with Podiatrist Dr. Elvina Mattes prior to evaluation who confirmed pt may be WBAT LLE as heel ulcer is posteriorly. Restrictions Weight Bearing Restrictions: Yes LLE Weight Bearing: Weight bearing as tolerated      Mobility  Bed Mobility Overal bed mobility: Needs Assistance;+2 for physical assistance Bed Mobility: Supine to Sit;Sit to Supine     Supine to sit: Max assist;+2 for physical assistance;HOB elevated Sit to supine: Max assist;+2 for physical assistance   General bed mobility comments: Pt is able to advance LLE to EOB actively without assist with max increased time and multiple rest breaks.  He requires assist to elevate trunk and to scoot to EOB. To return to supine from sitting EOB pt is able to scoot hips posteriorly in bed but requires assist managing BLEs and directing trunk to achieve supine.  Transfers Overall transfer level: Needs assistance Equipment used: Rolling walker (2 wheeled) Transfers: Sit to/from Stand Sit to Stand: Mod assist;+2 physical assistance;From elevated surface         General transfer comment: Bed raised and pt requires min +2 assist to boost and steady to standing.  However, once pt standing he begins shouting, "I am going to fall" even when he is steady and refuses to attempt pivot  to chair.  Pt continued to be extremely anxious about falling and is shouting, "You better let me sit down!!".   Ambulation/Gait             General Gait Details: unable to attempt  Stairs            Wheelchair Mobility    Modified Rankin (Stroke Patients Only)       Balance Overall balance assessment: Needs assistance;History of Falls Sitting-balance support: Feet supported;Single extremity supported Sitting balance-Leahy Scale: Poor Sitting balance - Comments:  Must have at least 1 UE supported to sit EOB Postural control: Posterior lean Standing balance support: Bilateral upper extremity supported;During functional activity Standing balance-Leahy Scale: Poor Standing balance comment: Relies on RW and physical assist to steady                              Pertinent Vitals/Pain Pain Assessment: Faces Faces Pain Scale: Hurts even more Pain Location: L heel and knee Pain Descriptors / Indicators: Grimacing;Guarding;Moaning Pain Intervention(s): Limited activity within patient's tolerance;Monitored during session;Repositioned    Home Living Family/patient expects to be discharged to:: Skilled nursing facility Living Arrangements: Alone Available Help at Discharge: Other (Comment) (no assist available) Type of Home: House Home Access: Ramped entrance       Home Equipment: Wheelchair - manual;Cane - single point;Walker - 2 wheels Additional Comments: Information limited as pt very talkative and difficult to keep focused    Prior Function Level of Independence: Independent with assistive device(s)         Comments: Pt reports he was ambulating with a RW, could ambulate 100 ft.  Was taking a sponge bath Ind.       Hand Dominance        Extremity/Trunk Assessment   Upper Extremity Assessment: Overall WFL for tasks assessed           Lower Extremity Assessment: LLE deficits/detail   LLE Deficits / Details: limited ROM and strength as expected following surgery documented above  Cervical / Trunk Assessment: Kyphotic  Communication   Communication: No difficulties  Cognition Arousal/Alertness: Awake/alert Behavior During Therapy: Agitated;Anxious Overall Cognitive Status: Within Functional Limits for tasks assessed                      General Comments General comments (skin integrity, edema, etc.): Prior to session RN notifies this PT that the pt has been agitated since his admission.  He was very  difficulty to keep on task as he is consumed by expressing "I am weak, I can't do anything when I am this weak".  RN aware that pt has requested an ensure as he has not had an appetitie and pt was told that this requires a doctor's approval.  Pt remains consumed by his perceived weakness and this prevents him from fully participating in therapy.  Pt educated on the benefits of mobility and the potential consequences of delaying mobility.  This PT was empathetic and explained to the pt that although this PT is not a licensed nutritionist that this PT would relay this infromation to the RN.    Exercises General Exercises - Lower Extremity Ankle Circles/Pumps: AROM;Both;10 reps;Supine Quad Sets: Strengthening;Both;10 reps;Supine;Other (comment) (with 3 second holds) Gluteal Sets: Strengthening;Both;10 reps;Supine;Other (comment) (with 3 second holds) Heel Slides: Strengthening;Left;10 reps;Supine;Other (comment) (limited to ~10 deg L knee flexion) Hip ABduction/ADduction: Strengthening;Both;10 reps;Supine   Assessment/Plan    PT Assessment Patient needs  continued PT services  PT Problem List Decreased strength;Decreased range of motion;Decreased activity tolerance;Decreased balance;Decreased mobility;Decreased knowledge of use of DME;Decreased safety awareness;Pain;Decreased knowledge of precautions          PT Treatment Interventions DME instruction;Gait training;Functional mobility training;Therapeutic activities;Therapeutic exercise;Balance training;Neuromuscular re-education;Patient/family education;Modalities    PT Goals (Current goals can be found in the Care Plan section)  Acute Rehab PT Goals Patient Stated Goal: to have an Ensure PT Goal Formulation: With patient Time For Goal Achievement: 10/29/16 Potential to Achieve Goals: Fair    Frequency 7X/week (will attempt to see pt BID if pt willing to fullyparticipate)   Barriers to discharge Decreased caregiver support Lives alone     Co-evaluation               End of Session Equipment Utilized During Treatment: Gait belt;Oxygen Activity Tolerance: Patient limited by pain;Treatment limited secondary to agitation Patient left: in bed;with call bell/phone within reach;with bed alarm set;with SCD's reapplied;Other (comment) (with Bil Prevalon boots in place) Nurse Communication: Mobility status;Other (comment);Weight bearing status;Precautions (pt requesting Ensure, RN already aware)         Time: [id number] PT Time Calculation (min) (ACUTE ONLY): 53 min   Charges:   PT Evaluation $PT Eval High Complexity: 1 Procedure PT Treatments $Therapeutic Exercise: 23-37 mins $Therapeutic Activity: 8-22 mins   PT G Codes:        Collie Siad PT, DPT 10/15/2016, 1:28 PM

## 2016-10-15 NOTE — Progress Notes (Signed)
RN changed patient dressing on left heel. Old dressing had minimal yellow and serosanguineous drainage. The middle of the wound has eschar tissue with granular tissue surrounding it. RN applied santyl and wet to dry dressing on it. Patient not having any pain at this time.   Deri Fuelling, RN

## 2016-10-15 NOTE — Progress Notes (Signed)
Physical Therapy Treatment Patient Details Name: Kurt Hill MRN: [id number] DOB: February 03, 1932 Today's Date: 10/15/2016    History of Present Illness Pt is a 82 y/o M who presented to the ED after a mechanical fall.  He has had two previous revisions of his ORIF L patella due to recurrent infection.  Pt was found to have dehiscence of his surgical incision and a severe laceration to his L ear.  Due to his fall he lost fixation of L patella and is now s/p ORIF L patella (11/6).  Additionally, pt has a sore on his posterior L heel.  Pt's PMH includes COPD, hypothyroidism, skin cancer, ORIF L patella (07/30/16 and 08/15/16).    PT Comments    Pt verbally aggressive toward this PT and agitated before start of PT.  He is agreeable to work with therapy this session but very negative and consumed by fear of falling.  Max encouragement provided to motivate pt to attempt OOB to chair.  He requires max +2 assist for bed mobility and mod +2 assist for sit<>stand and stand pivot transfers.  Pt again educated on the benefits of mobility and the potential consequences of delaying mobility.  Pt will benefit from continued skilled PT services to increase functional independence and safety.   Follow Up Recommendations  SNF     Equipment Recommendations  None recommended by PT    Recommendations for Other Services OT consult     Precautions / Restrictions Precautions Precautions: Fall;Other (comment) Precaution Comments: No PROM of knee, AROM only.  There is a KI in the pt's room but spoke with Dr. Rudene Christians on the phone who confirmed pt does not need to use KI.  Spoke with Podiatrist Dr. Elvina Mattes prior to evaluation who confirmed pt may be WBAT LLE as heel ulcer is posterior. Restrictions Weight Bearing Restrictions: Yes LLE Weight Bearing: Weight bearing as tolerated    Mobility  Bed Mobility Overal bed mobility: Needs Assistance;+2 for physical assistance Bed Mobility: Supine to Sit     Supine to sit:  Max assist;+2 for physical assistance;HOB elevated Sit to supine: Max assist;+2 for physical assistance   General bed mobility comments: Min assist provided to adavnce LLE to EOB and max +2 assist to elevate trunk and to scoot pt EOB using bed pad.  Pt with heavy use of bed rail.  Transfers Overall transfer level: Needs assistance Equipment used: Rolling walker (2 wheeled) Transfers: Sit to/from [facility] Sit to Stand: Mod assist;+2 physical assistance;From elevated surface Stand pivot transfers: Mod assist;+2 physical assistance;+2 safety/equipment       General transfer comment: Bed raised and pt requires mod +2 to boost to standing with cues provided to stand upright once standing.  Max verbal cues for directions to pivot to chair and for sequencing.  Pt consistently shouting, "I'm going to sue you and the hospital if I fall!!"      Ambulation/Gait             General Gait Details: pt refuses to attempt   Stairs            Wheelchair Mobility    Modified Rankin (Stroke Patients Only)       Balance Overall balance assessment: Needs assistance;History of Falls Sitting-balance support: No upper extremity supported;Feet supported Sitting balance-Leahy Scale: Poor Sitting balance - Comments: Must have at least 1 UE supported to sit EOB Postural control: Posterior lean Standing balance support: Bilateral upper extremity supported;During functional activity Standing balance-Leahy Scale: Poor Standing  balance comment: Relies on RW and physical assist to steady                    Cognition Arousal/Alertness: Awake/alert Behavior During Therapy: Agitated;Anxious Overall Cognitive Status: Within Functional Limits for tasks assessed                      Exercises General Exercises - Lower Extremity Ankle Circles/Pumps: AROM;Both;10 reps;Supine Quad Sets: Strengthening;Both;10 reps;Supine;Other (comment) (with 3 second holds) Gluteal  Sets: Strengthening;Both;10 reps;Supine;Other (comment) (with 3 second holds) Long Arc Quad: Limitations Long [employer] Limitations: Pt unable to achieve AROM L knee flexion to be able to perform LAQ.  L knee flexion limited to ~30 deg flexion while seated in chair. Heel Slides: Strengthening;Left;10 reps;Supine;Other (comment) (limited to ~10 deg L knee flexion) Hip ABduction/ADduction: Strengthening;Both;10 reps;Supine Straight Leg Raises: Left;10 reps;Supine;AAROM;Strengthening    General Comments General comments (skin integrity, edema, etc.): Pt verbally aggressive toward this PT and agitated before start of PT.  He is agreeable to work with therapy this session but very negative and consumed by fear of falling.  Max encouragement provided to motivate pt to attempt OOB to chair.      Pertinent Vitals/Pain Pain Assessment: Faces Faces Pain Scale: Hurts even more Pain Location: L heel and knee Pain Descriptors / Indicators: Grimacing;Guarding;Moaning Pain Intervention(s): Limited activity within patient's tolerance;Monitored during session;Premedicated before session;Repositioned    Home Living Family/patient expects to be discharged to:: Skilled nursing facility Living Arrangements: Alone Available Help at Discharge: Other (Comment) (no assist available) Type of Home: House Home Access: Ramped entrance     Home Equipment: Wheelchair - manual;Cane - single point;Walker - 2 wheels Additional Comments: Information limited as pt very talkative and difficult to keep focused    Prior Function Level of Independence: Independent with assistive device(s)      Comments: Pt reports he was ambulating with a RW, could ambulate 100 ft.  Was taking a sponge bath Ind.     PT Goals (current goals can now be found in the care plan section) Acute Rehab PT Goals Patient Stated Goal: decreased pain PT Goal Formulation: With patient Time For Goal Achievement: 10/29/16 Potential to Achieve Goals:  Fair Progress towards PT goals: Progressing toward goals (very modestly)    Frequency    7X/week (will attempt to see pt BID if pt willing to fullyparticipate)      PT Plan Current plan remains appropriate    Co-evaluation             End of Session Equipment Utilized During Treatment: Gait belt;Oxygen Activity Tolerance: Patient limited by pain;Treatment limited secondary to agitation Patient left: with call bell/phone within reach;with SCD's reapplied;Other (comment);in chair;with chair alarm set (with Bil Prevalon boots in place)     Time: 1349-1430 PT Time Calculation (min) (ACUTE ONLY): 41 min  Charges:  $Therapeutic Exercise: 23-37 mins $Therapeutic Activity: 8-22 mins                    G Codes:       Collie Siad PT, DPT 10/15/2016, 2:49 PM

## 2016-10-16 LAB — VANCOMYCIN, TROUGH: Vancomycin Tr: 18 ug/mL (ref 15–20)

## 2016-10-16 MED ORDER — SODIUM CHLORIDE 0.9 % IV BOLUS (SEPSIS)
1000.0000 mL | Freq: Once | INTRAVENOUS | Status: AC
  Administered 2016-10-16: 1000 mL via INTRAVENOUS

## 2016-10-16 MED ORDER — MIDODRINE HCL 5 MG PO TABS
2.5000 mg | ORAL_TABLET | Freq: Three times a day (TID) | ORAL | Status: DC
  Administered 2016-10-16 – 2016-10-17 (×3): 2.5 mg via ORAL
  Filled 2016-10-16 (×2): qty 1
  Filled 2016-10-16 (×2): qty 0.5

## 2016-10-16 NOTE — Progress Notes (Signed)
Pt. Receiving 1000cc bolus for hypotension

## 2016-10-16 NOTE — Progress Notes (Signed)
[facility] [place] NAME: Sylar Foxx    MR#:  [id number]  DATE OF BIRTH:  Apr 22, 1932  SUBJECTIVE: Complains of left heel pain also complains of constipation. Blood pressure was low this morning, he says that he was on midodrine  before it helped him a lot and he is requesting to go back on midodrine..   CHIEF COMPLAINT:   Chief Complaint  Patient presents with  . Fall  . Ear Laceration     REVIEW OF SYSTEMS:    Review of Systems  Constitutional: Positive for malaise/fatigue. Negative for chills and fever.  HENT: Negative for sore throat.   Eyes: Negative for blurred vision, double vision and pain.  Respiratory: Negative for cough, hemoptysis, shortness of breath and wheezing.   Cardiovascular: Negative for chest pain, palpitations, orthopnea and leg swelling.  Gastrointestinal: Negative for abdominal pain, constipation, diarrhea, heartburn, nausea and vomiting.  Genitourinary: Negative for dysuria and hematuria.  Musculoskeletal: Positive for back pain, falls and joint pain.  Skin: Negative for rash.  Neurological: Positive for weakness. Negative for sensory change, speech change, focal weakness and headaches.  Endo/Heme/Allergies: Does not bruise/bleed easily.  Psychiatric/Behavioral: Negative for depression. The patient is not nervous/anxious.     DRUG ALLERGIES:   Allergies  Allergen Reactions  . Ivp Dye [Iodinated Diagnostic Agents] Itching and Rash  . Sulfa Antibiotics Rash    VITALS:  Blood pressure (!) 109/52, pulse 74, temperature 98.1 F (36.7 C), temperature source Axillary, resp. rate 19, height 5\' 10"  (1.778 m), weight 79.3 kg (174 lb 13.8 oz), SpO2 100 %.  PHYSICAL EXAMINATION:   Physical Exam  GENERAL:  82 y.o.-year-old patient lying in the bed with no acute distress.  EYES: Pupils equal, round, reactive to light and accommodation. No scleral icterus. Extraocular muscles intact.  LUNGS: Normal breath sounds  bilaterally, no wheezing, rales, rhonchi. No use of accessory muscles of respiration.  CARDIOVASCULAR: S1, S2 normal. No murmurs, rubs, or gallops.  ABDOMEN: Soft, nontender, nondistended. Bowel sounds present. No organomegaly or mass.  EXTREMITIES: The knee wound VAC present left heel black eschar present reviewed this wound with register nurse while she was doing dressing changes. PSYCHIATRIC: The patient is alert and oriented x 3.   Left knee wound and left heel ulcer and a dressing  LABORATORY PANEL:   CBC  Recent Labs Lab 10/14/16 1341  WBC 8.0  HGB 9.9*  HCT 30.2*  PLT 251   ------------------------------------------------------------------------------------------------------------------ Chemistries   Recent Labs Lab 10/13/16 0142 10/14/16 1341  NA 134*  --   K 4.3  --   CL 96*  --   CO2 29  --   GLUCOSE 138*  --   BUN 23*  --   CREATININE 1.20 0.92  CALCIUM 9.1  --    ------------------------------------------------------------------------------------------------------------------  Cardiac Enzymes No results for input(s): TROPONINI in the last 168 hours. ------------------------------------------------------------------------------------------------------------------  RADIOLOGY:  No results found.   ASSESSMENT AND PLAN:   This is an 82 year old male admitted for recurrent cellulitis.  1. Left knee wound daily since, hardware infection: Status post surgery, open reduction and fixation, left patella. Antibiotic beads placed  Inside knee . Continue the wound VAC, , Continue IV vancomycin until wound culture status are back.  # Left heel ulcer Podiatry consulted.wound care consult ed, needs a pressure relieving mattress,  dressing wet-to-dry with santyl.change dressign daily . 2. COPD: Stable; continue inhaled corticosteroid and albuterol as needed  3. Hypothyroidism: Continue Synthroid  4. BPH: Continue Proscar #5 hypotension: Midodrine started.  All  the records are reviewed and case discussed with Care Management/Social Workerr. Management plans discussed with the patient, family and they are in agreement.  CODE STATUS: FULL  DVT Prophylaxis: SCDs  TOTAL TIME TAKING CARE OF THIS PATIENT:30 minutes.   POSSIBLE D/C IN 3-4 DAYS, DEPENDING ON CLINICAL CONDITION.  Epifanio Lesches M.D on 10/16/2016 at 11:47 AM  Between 7am to 6pm - Pager - [phone]  After 6pm go to [email or web address] - password [email or web address] [facility]  Office  [phone]  CC: Primary care physician; Tracie Harrier, MD  Note: This dictation was prepared with Dragon dictation along with smaller phrase technology. Any transcriptional errors that result from this process are unintentional.

## 2016-10-16 NOTE — Progress Notes (Signed)
PT Cancellation Note  Patient Details Name: Kurt Hill MRN: [id number] DOB: 1932-12-01   Cancelled Treatment:    Reason Eval/Treat Not Completed: Patient declined, no reason specified.  As this PT entered the room the pt said, "Get out", "I'm not doing anything that puts these two feet on the floor".  Pt educated on the benefits of mobility and the potential consequences of delaying mobility but pt continued to refuse.    Collie Siad PT, DPT 10/16/2016, 3:43 PM

## 2016-10-16 NOTE — Consult Note (Signed)
[place] Nurse wound consult note Reason for Consult:Unstageable pressure injury to left heel.  Present on admission.  Patient has had knee surgery and revision.  Currently with incisional VAC to left knee and orders for no ROM.   Wound type:unstageable pressure injury, present on admission.  Left heel is offloaded in pressure relieving boot and care is given not to hyperextend knee and no ROM per MD orders.  Pressure Ulcer POA: Yes Measurement: 2 cm x 2 cm 100% eschar, some drainage noted and erythema to periwound.  Will begin enzymatic debridement.  Wound bed:100% eschar Drainage (amount, consistency, odor) Minimal serosanguinous  No odor.  Periwound:erythema Dressing procedure/placement/frequency:Cleanse left heel ulcer with soap and water and pat gently dry.  Apply Santyl to wound bed.  Cover with NS moist 2x2.  Secure with 4x4 gauze and kerlix/tape,  Change daily.  Will not follow at this time.  Please re-consult if needed.  Domenic Moras RN BSN [place] Pager [phone]

## 2016-10-16 NOTE — Progress Notes (Signed)
1000 cc NS bolus administered as ordered for low blood pressure

## 2016-10-16 NOTE — Progress Notes (Signed)
PT Cancellation Note  Patient Details Name: Kurt Hill MRN: [id number] DOB: 09/13/1932   Cancelled Treatment:    Reason Eval/Treat Not Completed: Patient declined, no reason specified.  Pt saying, "my blood pressure is too low" and when asked if this PT checks his BP and it is WNL if he will work with therapy he replies, "No, I need a new diaper and I need to eat breakfast".  PT will attempt to see pt again later today, schedule permitting.  Of note, PT has acknowledged that new orders have been placed indicating no active or passive ROM.    Collie Siad PT, DPT 10/16/2016, 9:28 AM

## 2016-10-16 NOTE — Progress Notes (Signed)
Post-op day 2. Rested quietly during the night. NO complaints. woundvac to left knee with scant amt of drainage. Blood pressure is low this am. MD paged

## 2016-10-16 NOTE — Progress Notes (Signed)
Pharmacy Antibiotic Note  Kurt Hill is a 82 y.o. male admitted on 10/13/2016 with wound infection (wound VAC left knee) and pressure injury on heel.  Pharmacy has been consulted for vancomycin dosing.  This is day #4 of antibiotics  Plan: 11/8 1540 VT = 18 mcg/mL (therapeutic) with vancomycin trough goal of 15-20 mcg/mL for wound infection. Continue current regimen of vancomycin 1000 mg IV q18h. Will check SCr with AM labs tomorrow. If SCr stable, consider rechecking level in a few days.  Height: 5\' 10"  (177.8 cm) Weight: 174 lb 13.8 oz (79.3 kg) IBW/kg (Calculated) : 73  Temp (24hrs), Avg:98.8 F (37.1 C), Min:98.1 F (36.7 C), Max:99.3 F (37.4 C)   Recent Labs Lab 10/13/16 0142 10/14/16 1341 10/16/16 1540  WBC 8.5 8.0  --   CREATININE 1.20 0.92  --   VANCOTROUGH  --   --  18    Estimated Creatinine Clearance: 63.9 mL/min (by C-G formula based on SCr of 0.92 mg/dL).    Allergies  Allergen Reactions  . Ivp Dye [Iodinated Diagnostic Agents] Itching and Rash  . Sulfa Antibiotics Rash   Antimicrobials this admission: Vancomycin 11/5 >>     Dose adjustments this admission: N/A  Microbiology results: 11/5 BCx: No growth 3 days 11/5 MRSA PCR: Positive  Thank you for allowing pharmacy to be a part of this patient's care.  Lenis Noon, Pharm.D., BCPS Clinical Pharmacist 10/16/2016 7:23 PM

## 2016-10-16 NOTE — Progress Notes (Signed)
   Subjective: 2 Days Post-Op Procedure(s) (LRB): OPEN REDUCTION INTERNAL (ORIF) FIXATION PATELLA (Left) Patient reports pain as moderate.   Patient is well, and has had no acute complaints or problems.  Denies any CP, SOB, ABD pain. We will continue therapy today.  Plan is to go Skilled nursing facility after hospital stay.  Objective: Vital signs in last 24 hours: Temp:  [97.8 F (36.6 C)-99.3 F (37.4 C)] 99.3 F (37.4 C) (11/08 0455) Pulse Rate:  [69-73] 73 (11/08 0455) Resp:  [16-20] 16 (11/08 0455) BP: (82-119)/(45-52) 82/46 (11/08 0455) SpO2:  [97 %-100 %] 100 % (11/08 0455) Weight:  [79.3 kg (174 lb 13.8 oz)] 79.3 kg (174 lb 13.8 oz) (11/08 0500)  Intake/Output from previous day: 11/07 0701 - 11/08 0700 In: 760 [P.O.:760] Out: 0  Intake/Output this shift: No intake/output data recorded.   Recent Labs  10/14/16 1341  HGB 9.9*    Recent Labs  10/14/16 1341  WBC 8.0  RBC 3.28*  HCT 30.2*  PLT 251    Recent Labs  10/14/16 1341  CREATININE 0.92   No results for input(s): LABPT, INR in the last 72 hours.  EXAM General - Patient is Alert, Appropriate and Oriented Extremity - Neurovascular intact Sensation intact distally Intact pulses distally Dorsiflexion/Plantar flexion intact No cellulitis present Compartment soft  Left heel ulcer with intact dressing. Dressing - dressing C/D/I. Wound vac intact to left knee.  Motor Function - intact, moving foot and toes well on exam.   Past Medical History:  Diagnosis Date  . Anemia   . Asthma   . BPH (benign prostatic hyperplasia)   . COPD (chronic obstructive pulmonary disease) (HCC)    not on home oxygen  . Coronary artery disease   . Hypotension   . Hypothyroidism   . Skin abnormalities   . Skin cancer     Assessment/Plan:   2 Days Post-Op Procedure(s) (LRB): OPEN REDUCTION INTERNAL (ORIF) FIXATION PATELLA (Left) Active Problems:   Cellulitis   Pressure injury of skin  Estimated body mass  index is 25.09 kg/m as calculated from the following:   Height as of this encounter: 5\' 10"  (1.778 m).   Weight as of this encounter: 79.3 kg (174 lb 13.8 oz). Advance diet Up with therapy, WBAT LLE. PT to avoid ALL KNEE RANGE OF MOTION  Continue with wound vac. Continue with IV abx   DVT Prophylaxis - Lovenox, Foot Pumps and TED hose Weight-Bearing as tolerated to left leg   T. Rachelle Hora, PA-C [place] 10/16/2016, 8:16 AM

## 2016-10-17 LAB — CBC
HEMATOCRIT: 22.6 % — AB (ref 40.0–52.0)
HEMOGLOBIN: 7.4 g/dL — AB (ref 13.0–18.0)
MCH: 29.7 pg (ref 26.0–34.0)
MCHC: 32.7 g/dL (ref 32.0–36.0)
MCV: 90.7 fL (ref 80.0–100.0)
Platelets: 287 10*3/uL (ref 150–440)
RBC: 2.49 MIL/uL — AB (ref 4.40–5.90)
RDW: 15.4 % — ABNORMAL HIGH (ref 11.5–14.5)
WBC: 8 10*3/uL (ref 3.8–10.6)

## 2016-10-17 LAB — CREATININE, SERUM
Creatinine, Ser: 0.97 mg/dL (ref 0.61–1.24)
GFR calc Af Amer: >60 mL/min (ref >60)
GFR calc non Af Amer: >60 mL/min (ref >60)

## 2016-10-17 MED ORDER — BISACODYL 5 MG PO TBEC
5.0000 mg | DELAYED_RELEASE_TABLET | Freq: Every day | ORAL | Status: DC | PRN
  Filled 2016-10-17: qty 1

## 2016-10-17 MED ORDER — BISACODYL 5 MG PO TBEC: 5.0000 mg | DELAYED_RELEASE_TABLET | Freq: Once | ORAL | Status: DC

## 2016-10-17 MED ORDER — MIDODRINE HCL 5 MG PO TABS
5.0000 mg | ORAL_TABLET | Freq: Three times a day (TID) | ORAL | Status: DC
  Administered 2016-10-17 – 2016-10-21 (×13): 5 mg via ORAL
  Filled 2016-10-17 (×13): qty 1

## 2016-10-17 MED ORDER — SODIUM CHLORIDE 0.9 % IV BOLUS (SEPSIS)
1000.0000 mL | Freq: Once | INTRAVENOUS | Status: AC
  Administered 2016-10-17: 1000 mL via INTRAVENOUS

## 2016-10-17 MED ORDER — SODIUM CHLORIDE 0.9 % IV SOLN: INTRAVENOUS | Status: DC

## 2016-10-17 NOTE — Progress Notes (Signed)
  Subjective: 3 Days Post-Op Procedure(s) (LRB): OPEN REDUCTION INTERNAL (ORIF) FIXATION PATELLA (Left) Patient reports pain as moderate.   Patient complains of constipation, feeling lightheaded. Refusing PT. Denies any CP, SOB, ABD pain. We will continue therapy today.  Plan is to go Skilled nursing facility after hospital stay.  Objective: Vital signs in last 24 hours: Temp:  [97.8 F (36.6 C)-99.6 F (37.6 C)] 97.8 F (36.6 C) (11/09 0806) Pulse Rate:  [64-83] 83 (11/09 0806) Resp:  [16-19] 18 (11/09 0806) BP: (83-115)/(45-59) 90/48 (11/09 0806) SpO2:  [98 %-100 %] 100 % (11/09 0806) Weight:  [77.4 kg (170 lb 11.2 oz)] 77.4 kg (170 lb 11.2 oz) (11/09 0537)  Intake/Output from previous day: 11/08 0701 - 11/09 0700 In: 3 [I.V.:3] Out: 0  Intake/Output this shift: No intake/output data recorded.   Recent Labs  10/14/16 1341 10/17/16 0322  HGB 9.9* 7.4*    Recent Labs  10/14/16 1341 10/17/16 0322  WBC 8.0 8.0  RBC 3.28* 2.49*  HCT 30.2* 22.6*  PLT 251 287    Recent Labs  10/14/16 1341 10/17/16 0321  CREATININE 0.92 0.97   No results for input(s): LABPT, INR in the last 72 hours.  EXAM General - Patient is Alert, Appropriate and Oriented Extremity - Neurovascular intact Sensation intact distally Intact pulses distally Dorsiflexion/Plantar flexion intact No cellulitis present Compartment soft  Left heel ulcer with intact dressing. Dressing - dressing C/D/I. Wound vac intact to left knee.  Motor Function - intact, moving foot and toes well on exam.   Past Medical History:  Diagnosis Date  . Anemia   . Asthma   . BPH (benign prostatic hyperplasia)   . COPD (chronic obstructive pulmonary disease) (HCC)    not on home oxygen  . Coronary artery disease   . Hypotension   . Hypothyroidism   . Skin abnormalities   . Skin cancer     Assessment/Plan:   3 Days Post-Op Procedure(s) (LRB): OPEN REDUCTION INTERNAL (ORIF) FIXATION PATELLA (Left) Active  Problems:   Cellulitis   Pressure injury of skin  Estimated body mass index is 24.49 kg/m as calculated from the following:   Height as of this encounter: 5\' 10"  (1.778 m).   Weight as of this encounter: 77.4 kg (170 lb 11.2 oz). Advance diet Up with therapy, WBAT LLE. PT to avoid ALL KNEE RANGE OF MOTION. Patient hesitant to participate with PT due to multiple issues. Encouraged participation with PT.  Continue with wound vac. Continue with IV abx    DVT Prophylaxis - Lovenox, Foot Pumps and TED hose Weight-Bearing as tolerated to left leg   T. Rachelle Hora, PA-C [place] 10/17/2016, 10:46 AM

## 2016-10-17 NOTE — Progress Notes (Signed)
[facility] NAME: Kurt Hill    MR#:  [id number]  DATE OF BIRTH:  Sep 15, 1932  SUBJECTIVE: has low blood pressure, also concerned about constipation. He is asking to see the orthopedic doctor regarding physical therapy .  CHIEF COMPLAINT:   Chief Complaint  Patient presents with  . Fall  . Ear Laceration     REVIEW OF SYSTEMS:    Review of Systems  Constitutional: Positive for malaise/fatigue. Negative for chills and fever.  HENT: Negative for sore throat.   Eyes: Negative for blurred vision, double vision and pain.  Respiratory: Negative for cough, hemoptysis, shortness of breath and wheezing.   Cardiovascular: Negative for chest pain, palpitations, orthopnea and leg swelling.  Gastrointestinal: Negative for abdominal pain, constipation, diarrhea, heartburn, nausea and vomiting.  Genitourinary: Negative for dysuria and hematuria.  Musculoskeletal: Positive for back pain, falls and joint pain.  Skin: Negative for rash.  Neurological: Positive for weakness. Negative for sensory change, speech change, focal weakness and headaches.  Endo/Heme/Allergies: Does not bruise/bleed easily.  Psychiatric/Behavioral: Negative for depression. The patient is not nervous/anxious.     DRUG ALLERGIES:   Allergies  Allergen Reactions  . Ivp Dye [Iodinated Diagnostic Agents] Itching and Rash  . Sulfa Antibiotics Rash    VITALS:  Blood pressure (!) 90/48, pulse 83, temperature 97.8 F (36.6 C), temperature source Oral, resp. rate 19, height 5\' 10"  (1.778 m), weight 77.4 kg (170 lb 11.2 oz), SpO2 100 %.  PHYSICAL EXAMINATION:   Physical Exam  GENERAL:  82 y.o.-year-old patient lying in the bed with no acute distress.  EYES: Pupils equal, round, reactive to light and accommodation. No scleral icterus. Extraocular muscles intact.  LUNGS: Normal breath sounds bilaterally, no wheezing, rales, rhonchi. No use of accessory muscles of  respiration.  CARDIOVASCULAR: S1, S2 normal. No murmurs, rubs, or gallops.  ABDOMEN: Soft, nontender, nondistended. Bowel sounds present. No organomegaly or mass.  EXTREMITIES: The knee wound VAC present left heel black eschar present reviewed this wound with register nurse while she was doing dressing changes. PSYCHIATRIC: The patient is alert and oriented x 3.   Left knee wound and left heel ulcer and a dressing  LABORATORY PANEL:   CBC  Recent Labs Lab 10/14/16 1341  WBC 8.0  HGB 9.9*  HCT 30.2*  PLT 251   ------------------------------------------------------------------------------------------------------------------ Chemistries   Recent Labs Lab 10/13/16 0142  10/17/16 0321  NA 134*  --   --   K 4.3  --   --   CL 96*  --   --   CO2 29  --   --   GLUCOSE 138*  --   --   BUN 23*  --   --   CREATININE 1.20  < > 0.97  CALCIUM 9.1  --   --   < > = values in this interval not displayed. ------------------------------------------------------------------------------------------------------------------  Cardiac Enzymes No results for input(s): TROPONINI in the last 168 hours. ------------------------------------------------------------------------------------------------------------------  RADIOLOGY:  No results found.   ASSESSMENT AND PLAN:   This is an 82 year old male admitted for recurrent cellulitis.  1. Left knee wound dehiscence,, hardware infection: Status post surgery, open reduction and fixation, left patella. Antibiotic beads placed  Inside knee . Continue the wound VAC, , Continue IV vancomycin until wound culture status are back. Appreciate of the follow-up regarding range of motion of the left leg, physical therapy. Patient to refusing physical therapy because of low blood pressure and also concerned  about range of motion/physical therapy instructions. Check CBC.,relayed information to ortho. # Left heel ulcer Podiatry consulted.wound care consult ed,  needs a pressure relieving mattress,  dressing wet-to-dry with santyl.change dressign daily  seen By wound care nurse. . 2. COPD: Stable; continue inhaled corticosteroid and albuterol as needed  3. Hypothyroidism: Continue Synthroid   4. BPH: Continue Proscar #5 hypotension: Midodrine dose is increased 6. constipation, patient abdomen is soft, good bowel sounds present. Continue stool softeners, patient requesting Ex-Lax. D/w RN,pt. All the records are reviewed and case discussed with Care Management/Social Workerr. Management plans discussed with the patient, family and they are in agreement.  CODE STATUS: FULL  DVT Prophylaxis: SCDs  TOTAL TIME TAKING CARE OF THIS PATIENT:30 minutes.   POSSIBLE D/C IN 3-4 DAYS, DEPENDING ON CLINICAL CONDITION.  Epifanio Lesches M.D on 10/17/2016 at 10:00 AM  Between 7am to 6pm - Pager - [phone]  After 6pm go to [email or web address] - password [email or web address] [facility]  Office  [phone]  CC: Primary care physician; Tracie Harrier, MD  Note: This dictation was prepared with Dragon dictation along with smaller phrase technology. Any transcriptional errors that result from this process are unintentional.

## 2016-10-17 NOTE — Progress Notes (Signed)
Shift assessment completed. Pt at that time was resting in bed, began complaining that physical therapy wanted him to get oob and that the ortho doctor told him not to bend his L leg, etc.On pt's L leg are the words written NO ROM. Pt is on 2LNC, lungs are decreased to bilat bases, respirations are shallow. HR is regular. Abdomen is soft, bs heard. Pt is c/o constipation, this writer reminded pt that he just received lactulose to address this. Pt is wearing incontinence brief. L leg has a wound vac intact to the knee area, edema noted to this area as well, wound vac is at 134mm of suction, scant drainage noted. L heel has dressing intact, both feet have blue boots in place, scd' in place to r leg. Cap refill to L foot is wnl. PIV #20 intact to l wrist, site is free of redness and swelling. Since assessment completed, Pt has had new iv placed by this writer to L fa, and has received ns bolus of 1 liter. Pt agreed to work with physical therapy and is currently oob to chair, awaiting return to bed, knee immobilizer in place.

## 2016-10-17 NOTE — Progress Notes (Signed)
Physical Therapy Treatment Patient Details Name: Kurt Hill MRN: [id number] DOB: 1932-08-01 Today's Date: 10/17/2016    History of Present Illness Pt is a 82 y/o M who presented to the ED after a mechanical fall.  He has had two previous revisions of his ORIF L patella due to recurrent infection.  Pt was found to have dehiscence of his surgical incision and a severe laceration to his L ear.  Due to his fall he lost fixation of L patella and is now s/p ORIF L patella (11/6).  Additionally, pt has a sore on his posterior L heel.  Pt's PMH includes COPD, hypothyroidism, skin cancer, ORIF L patella (07/30/16 and 08/15/16).   PT Comments    Kurt Hill was pleasant and agreeable to therapy this afternoon.  He did decline ambulating but pivoted from the chair to the bed with min assist.  Mod assist provided to assist LLE into bed but pt able to position himself correctly in bed once supine.  Pt will benefit from continued skilled PT services to increase functional independence and safety.   Follow Up Recommendations  SNF     Equipment Recommendations  None recommended by PT    Recommendations for Other Services       Precautions / Restrictions Precautions Precautions: Fall;Other (comment) Precaution Comments: No PROM or AROM of the knee. Spoke with Podiatrist Dr. Elvina Mattes prior to evaluation who confirmed pt may be WBAT LLE as heel ulcer is posterior. Required Braces or Orthoses: Knee Immobilizer - Left Knee Immobilizer - Left: Other (comment) (No specific order, used due to pt's ROM restrictions) Restrictions Weight Bearing Restrictions: Yes LLE Weight Bearing: Weight bearing as tolerated    Mobility  Bed Mobility Overal bed mobility: Needs Assistance Bed Mobility: Sit to Supine     Supine to sit: Mod assist;HOB elevated Sit to supine: Mod assist   General bed mobility comments: Mod assist to manage LLE into bed from sitting to supine.  Pt able to scoot up and over in bed without  assist.  Transfers Overall transfer level: Needs assistance Equipment used: Rolling walker (2 wheeled) Transfers: Sit to/from Stand Sit to Stand: Min assist Stand pivot transfers: Min assist       General transfer comment: Min assist to raise to standing and to steady while standing from chair.  Cues for positioning of LLE during sit<>stand and for proper technique and hand position.    Ambulation/Gait Ambulation/Gait assistance: Min guard;+2 safety/equipment Ambulation Distance (Feet): 30 Feet Assistive device: Rolling walker (2 wheeled) Gait Pattern/deviations: Step-to pattern;Decreased stride length;Decreased weight shift to left;Decreased stance time - left;Decreased step length - right;Antalgic;Trunk flexed Gait velocity: decreased Gait velocity interpretation: <1.8 ft/sec, indicative of risk for recurrent falls General Gait Details: pt declined ambulating this afternoon   Stairs            Wheelchair Mobility    Modified Rankin (Stroke Patients Only)       Balance Overall balance assessment: Needs assistance Sitting-balance support: No upper extremity supported;Feet supported Sitting balance-Leahy Scale: Good     Standing balance support: Bilateral upper extremity supported;During functional activity Standing balance-Leahy Scale: Poor Standing balance comment: Relies on RW for support                    Cognition Arousal/Alertness: Awake/alert Behavior During Therapy: Anxious;WFL for tasks assessed/performed Overall Cognitive Status: Within Functional Limits for tasks assessed  Exercises General Exercises - Lower Extremity Ankle Circles/Pumps: AROM;Both;10 reps;Supine Hip ABduction/ADduction: AAROM;Left;10 reps;Supine Straight Leg Raises: AAROM;Left;10 reps;Supine    General Comments General comments (skin integrity, edema, etc.): L KI on upon PT arrival and remained on until pt comfortable in bed at end of  session.      Pertinent Vitals/Pain Pain Assessment: 0-10 Pain Score: 2  Faces Pain Scale: Hurts even more Pain Location: L knee Pain Descriptors / Indicators: Aching Pain Intervention(s): Limited activity within patient's tolerance;Monitored during session;Repositioned    Home Living                      Prior Function            PT Goals (current goals can now be found in the care plan section) Acute Rehab PT Goals Patient Stated Goal: decreased pain PT Goal Formulation: With patient Time For Goal Achievement: 10/29/16 Potential to Achieve Goals: Fair Progress towards PT goals: Progressing toward goals    Frequency    BID      PT Plan Current plan remains appropriate    Co-evaluation             End of Session Equipment Utilized During Treatment: Gait belt;Oxygen Activity Tolerance: Patient limited by pain Patient left: with call bell/phone within reach;with SCD's reapplied;Other (comment);in bed;with bed alarm set (bil Prevalon boots in place)     Time: 1250-1315 PT Time Calculation (min) (ACUTE ONLY): 25 min  Charges:  $Gait Training: 23-37 mins $Therapeutic Exercise: 8-22 mins $Therapeutic Activity: 8-22 mins                    G Codes:       Collie Siad PT, DPT 10/17/2016, 1:31 PM

## 2016-10-17 NOTE — Plan of Care (Signed)
Problem: Bowel/Gastric: Goal: Will not experience complications related to bowel motility Outcome: Progressing Pt is slowly progressing toward goal completion, has been reluctant to work with physical therapy but did get oob to chair this shift and return to bed. Low bp has been addressed by physician, and pt continues with wound vac and dressing to l heel.Pt had large BM today.

## 2016-10-17 NOTE — Progress Notes (Signed)
Physical Therapy Treatment Patient Details Name: Kurt Hill MRN: [id number] DOB: Aug 06, 1932 Today's Date: 10/17/2016    History of Present Illness Pt is a 82 y/o M who presented to the ED after a mechanical fall.  He has had two previous revisions of his ORIF L patella due to recurrent infection.  Pt was found to have dehiscence of his surgical incision and a severe laceration to his L ear.  Due to his fall he lost fixation of L patella and is now s/p ORIF L patella (11/6).  Additionally, pt has a sore on his posterior L heel.  Pt's PMH includes COPD, hypothyroidism, skin cancer, ORIF L patella (07/30/16 and 08/15/16).    PT Comments    Kurt Hill continues to be verbally aggressive toward this PT and rehab tech assisting, although he does say, "I'm surprised you still want to help me with how mean I've been to you".  Pt continues to receive education each session on the benefits of mobility and the potential consequences of delaying mobility.  With encouragement from Kurt Hill and this PT pt agreeable to work with PT and to ambulate.  He ambulated 30 ft to his door but declines ambulating into the hallway.  At the end of the session pt resting comfortably in recliner chair and agreeable to attempt sitting in chair for as long as he can.  Pt will benefit from continued skilled PT services to increase functional independence and safety.   Follow Up Recommendations  SNF     Equipment Recommendations  None recommended by PT    Recommendations for Other Services       Precautions / Restrictions Precautions Precautions: Fall;Other (comment) Precaution Comments: No PROM or AROM of the knee. Spoke with Podiatrist Dr. Elvina Mattes prior to evaluation who confirmed pt may be WBAT LLE as heel ulcer is posterior. Required Braces or Orthoses: Knee Immobilizer - Left Knee Immobilizer - Left: Other (comment) (No specific order, used due to pt's ROM restrictions) Restrictions Weight Bearing Restrictions:  Yes LLE Weight Bearing: Weight bearing as tolerated    Mobility  Bed Mobility Overal bed mobility: Needs Assistance Bed Mobility: Supine to Sit     Supine to sit: Mod assist;HOB elevated     General bed mobility comments: Mod assist to manage LLE with KI applied prior to bed mobility.  Increased time and pt uses bed rail to pull up to sitting.  Pt able to scoot to EOB with LLE supported.  Transfers Overall transfer level: Needs assistance Equipment used: Rolling walker (2 wheeled) Transfers: Sit to/from Stand Sit to Stand: Min assist;+2 physical assistance;+2 safety/equipment         General transfer comment: Bed raised and pt requirse min assist to steady during sit>stand with cues for proper technique.    Ambulation/Gait Ambulation/Gait assistance: Min guard;+2 safety/equipment Ambulation Distance (Feet): 30 Feet Assistive device: Rolling walker (2 wheeled) Gait Pattern/deviations: Step-to pattern;Decreased stride length;Decreased weight shift to left;Decreased stance time - left;Decreased step length - right;Antalgic;Trunk flexed Gait velocity: decreased Gait velocity interpretation: <1.8 ft/sec, indicative of risk for recurrent falls General Gait Details: Cues for upright posture and to relax shoulder which pt replies, "I'm not going to do that right now".  Step to gait pattern with very decreased gait speed.  Close chair follow and +2 assist to manage wound vac and IV pole.  Pt declines ambulating in hallway so pt ambulated to the door before sitting.   Stairs  Wheelchair Mobility    Modified Rankin (Stroke Patients Only)       Balance Overall balance assessment: Needs assistance;History of Falls Sitting-balance support: No upper extremity supported;Feet supported Sitting balance-Leahy Scale: Fair     Standing balance support: Bilateral upper extremity supported;During functional activity Standing balance-Leahy Scale: Poor Standing balance  comment: Relies on RW for support                    Cognition Arousal/Alertness: Awake/alert Behavior During Therapy: Agitated;Anxious Overall Cognitive Status: Within Functional Limits for tasks assessed                      Exercises      General Comments General comments (skin integrity, edema, etc.): Kurt Hill present at start of PT session encouraging pt to get OOB and ambulate with PT.  Kurt Hill assisted with donning L KI.      Pertinent Vitals/Pain Pain Assessment: Faces Faces Pain Scale: Hurts even more Pain Location: L knee Pain Descriptors / Indicators: Grimacing;Guarding Pain Intervention(s): Limited activity within patient's tolerance;Monitored during session;Repositioned    Home Living                      Prior Function            PT Goals (current goals can now be found in the care plan section) Acute Rehab PT Goals Patient Stated Goal: decreased pain PT Goal Formulation: With patient Time For Goal Achievement: 10/29/16 Potential to Achieve Goals: Fair Progress towards PT goals: Progressing toward goals    Frequency    7X/week (will increase to BID if pt willing to participate)      PT Plan Current plan remains appropriate    Co-evaluation             End of Session Equipment Utilized During Treatment: Gait belt;Oxygen Activity Tolerance: Patient limited by pain;Treatment limited secondary to agitation Patient left: in chair;with call bell/phone within reach;with chair alarm set;with SCD's reapplied;Other (comment) (bil Prevalon boots in place)     Time: [id number] PT Time Calculation (min) (ACUTE ONLY): 43 min  Charges:  $Gait Training: 23-37 mins $Therapeutic Activity: 8-22 mins                    G Codes:      Collie Siad PT, DPT 10/17/2016, 11:27 AM

## 2016-10-18 LAB — CULTURE, BLOOD (ROUTINE X 2)
CULTURE: NO GROWTH
Culture: NO GROWTH

## 2016-10-18 LAB — VANCOMYCIN, TROUGH: Vancomycin Tr: 36 ug/mL (ref 15–20)

## 2016-10-18 MED ORDER — CHLORPROMAZINE HCL 10 MG PO TABS
10.0000 mg | ORAL_TABLET | Freq: Three times a day (TID) | ORAL | Status: DC | PRN
  Administered 2016-10-18 – 2016-10-19 (×2): 10 mg via ORAL
  Filled 2016-10-18 (×3): qty 1

## 2016-10-18 NOTE — Progress Notes (Signed)
Shift assessment completed at 0830. Dr. Rudene Christians had been in and dc'd pt's woundvac and placed a honeycomb dressing to pt's L knee area. Pt is alert and oriented, explained at great length that he is uneasy about transferring into a car and is able to relate to this writer that Dr. Rudene Christians told him that his l leg can be used for whatever motion he can tolerate. Pt is on O2 at [facility], this is removed and subsequent check revealed pt to be 975 on room air. Pt has laceration to the l side of his head and his l ear, areas are dry. Lungs are clear bilat, S1S2 heard. Abdomen is soft, bs heard. Pt has had hiccups all this shift. PPP, blue boots are on bilat for heel protection, and dressing to L heel has been changed per orders, scant drainage noted to old dressing. 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 Dr. Vianne Bulls rounded on pt and will give pt something to help with the hiccups. Pt has not c/o pain this shift. Call bell in reach. Pt reports poor appetite, is taking ensure supplements. PIV #22 intact to l fa, site is free of redness and swelling.

## 2016-10-18 NOTE — Progress Notes (Signed)
Physical Therapy Treatment Patient Details Name: Kurt Hill MRN: [id number] DOB: 1932-05-17 Today's Date: 10/18/2016    History of Present Illness Pt is a 82 y/o M who presented to the ED after a mechanical fall.  He has had two previous revisions of his ORIF L patella due to recurrent infection.  Pt was found to have dehiscence of his surgical incision and a severe laceration to his L ear.  Due to his fall he lost fixation of L patella and is now s/p ORIF L patella (11/6).  Additionally, pt has a sore on his posterior L heel.  Pt's PMH includes COPD, hypothyroidism, skin cancer, ORIF L patella (07/30/16 and 08/15/16).    PT Comments    Kurt Hill was agreeable to therapy and to ambulate in his room.  He ambulated 30 ft before reporting fatigue and requesting to sit.  He refuses education on proper posture and technique while ambulating and demonstrates a flexed posture with BUEs heavily leaning into RW.  RN reminded of red blister found on R medial thigh this morning.  Pt will benefit from continued skilled PT services to increase functional independence and safety.   Follow Up Recommendations  SNF     Equipment Recommendations  None recommended by PT    Recommendations for Other Services       Precautions / Restrictions Precautions Precautions: Fall;Other (comment) Precaution Comments: No PROM or AROM of the knee. Spoke with Podiatrist Dr. Elvina Mattes prior to evaluation who confirmed pt may be WBAT LLE as heel ulcer is posterior. Required Braces or Orthoses: Knee Immobilizer - Left Knee Immobilizer - Left: Other (comment) (No specific order, used due to pt's ROM restrictions) Restrictions Weight Bearing Restrictions: Yes LLE Weight Bearing: Weight bearing as tolerated    Mobility  Bed Mobility Overal bed mobility: Needs Assistance Bed Mobility: Sit to Supine     Supine to sit: Min assist;HOB elevated Sit to supine: Min assist   General bed mobility comments: Min assist to  bring LLE into bed.  Increased effort and time.  Transfers Overall transfer level: Needs assistance Equipment used: Rolling walker (2 wheeled) Transfers: Sit to/from Stand Sit to Stand: Min guard         General transfer comment: Min guard as pt requires increased time and effort for sit<>stand.  Pt with safe technique.  Ambulation/Gait Ambulation/Gait assistance: Min guard Ambulation Distance (Feet): 30 Feet Assistive device: Rolling walker (2 wheeled) Gait Pattern/deviations: Step-to pattern;Decreased stride length;Decreased weight shift to left;Decreased stance time - left;Decreased step length - right;Antalgic;Trunk flexed Gait velocity: decreased Gait velocity interpretation: <1.8 ft/sec, indicative of risk for recurrent falls General Gait Details: Pt with flexed posture and heavy leaning on UEs.  Cues provided for upright posture but pt not very receptive of this PT's suggestions.  Pt declines ambulating in the hallway this session due to fatigue.   Stairs            Wheelchair Mobility    Modified Rankin (Stroke Patients Only)       Balance Overall balance assessment: Needs assistance;History of Falls Sitting-balance support: No upper extremity supported;Feet supported Sitting balance-Leahy Scale: Good     Standing balance support: Bilateral upper extremity supported;During functional activity Standing balance-Leahy Scale: Poor Standing balance comment: Relies on RW for support                    Cognition Arousal/Alertness: Awake/alert Behavior During Therapy: Anxious;Agitated;WFL for tasks assessed/performed Overall Cognitive Status: Within Functional Limits for  tasks assessed                      Exercises      General Comments General comments (skin integrity, edema, etc.): Called RN regarding red blister on R medial thigh that was found this morning (see PT note from this am).  RN says she has not had a chance to look at it yet but  will look at it this afternoon.  Knee immobilizer taken off after session so nursing staff would be able to easily access the blister.      Pertinent Vitals/Pain Pain Assessment: Faces Faces Pain Scale: Hurts a little bit Pain Location: L knee Pain Descriptors / Indicators: Guarding;Moaning Pain Intervention(s): Limited activity within patient's tolerance;Monitored during session;Repositioned    Home Living                      Prior Function            PT Goals (current goals can now be found in the care plan section) Acute Rehab PT Goals Patient Stated Goal: decreased pain PT Goal Formulation: With patient Time For Goal Achievement: 10/29/16 Potential to Achieve Goals: Fair Progress towards PT goals: Progressing toward goals    Frequency    BID      PT Plan Current plan remains appropriate    Co-evaluation             End of Session Equipment Utilized During Treatment: Gait belt Activity Tolerance: Patient limited by fatigue Patient left: with call bell/phone within reach;Other (comment);in bed;with bed alarm set;with SCD's reapplied (bil Prevalon boots in place)     Time: [id number] PT Time Calculation (min) (ACUTE ONLY): 42 min  Charges:  $Gait Training: 8-22 mins $Therapeutic Activity: 23-37 mins                    G Codes:       Collie Siad PT, DPT 10/18/2016, 2:55 PM

## 2016-10-18 NOTE — Progress Notes (Signed)
Physical Therapy Treatment Patient Details Name: Kurt Hill MRN: [id number] DOB: 1932/01/22 Today's Date: 10/18/2016    History of Present Illness Pt is a 82 y/o M who presented to the ED after a mechanical fall.  He has had two previous revisions of his ORIF L patella due to recurrent infection.  Pt was found to have dehiscence of his surgical incision and a severe laceration to his L ear.  Due to his fall he lost fixation of L patella and is now s/p ORIF L patella (11/6).  Additionally, pt has a sore on his posterior L heel.  Pt's PMH includes COPD, hypothyroidism, skin cancer, ORIF L patella (07/30/16 and 08/15/16).    PT Comments    Kurt Hill made good progress this morning, ambulating 60 ft using the RW and min guard assist.  Pt with flexed posture and upper trap activation but responds with "don't start giving me extras or I'll get mean and hateful!"  with attempt to provide pt with cues for correct technique and posture. SNF remains most appropriate d/c plan, although pt is currently refusing. He will need HHPT and max out Kurt Hill services if pt is d/c home as he lives alone.  Pt will benefit from continued skilled PT services to increase functional independence and safety.   Follow Up Recommendations  SNF     Equipment Recommendations  None recommended by PT    Recommendations for Other Services       Precautions / Restrictions Precautions Precautions: Fall;Other (comment) Precaution Comments: No PROM or AROM of the knee. Spoke with Podiatrist Dr. Elvina Mattes prior to evaluation who confirmed pt may be WBAT LLE as heel ulcer is posterior. Required Braces or Orthoses: Knee Immobilizer - Left Knee Immobilizer - Left: Other (comment) (No specific order, used due to pt's ROM restrictions) Restrictions Weight Bearing Restrictions: Yes LLE Weight Bearing: Weight bearing as tolerated    Mobility  Bed Mobility Overal bed mobility: Needs Assistance Bed Mobility: Supine to Sit     Supine  to sit: Min assist;HOB elevated     General bed mobility comments: Min assist to manage LLE to EOB.  Pt requires increased time and effort with use of bed rail.  Transfers Overall transfer level: Needs assistance Equipment used: Rolling walker (2 wheeled) Transfers: Sit to/from Stand Sit to Stand: Min assist         General transfer comment: Min assist to raise to standing and to steady.  Cues for positioning of LLE during sit<>stand and for proper technique and hand position.   Ambulation/Gait Ambulation/Gait assistance: Min guard;+2 safety/equipment Ambulation Distance (Feet): 60 Feet Assistive device: Rolling walker (2 wheeled) Gait Pattern/deviations: Step-to pattern;Decreased stride length;Decreased weight shift to left;Decreased stance time - left;Decreased step length - right;Antalgic;Trunk flexed Gait velocity: decreased Gait velocity interpretation: <1.8 ft/sec, indicative of risk for recurrent falls General Gait Details: Pt with flexed posture and upper trap activation but responds with "don't start giving me extras or I'll get mean and hateful!"  with attempt to provide pt with cues for correct technique and posture.    Stairs            Wheelchair Mobility    Modified Rankin (Stroke Patients Only)       Balance Overall balance assessment: Needs assistance;History of Falls Sitting-balance support: No upper extremity supported;Feet supported Sitting balance-Leahy Scale: Good     Standing balance support: Bilateral upper extremity supported;During functional activity Standing balance-Leahy Scale: Poor Standing balance comment: Relies on RW for  support                    Cognition Arousal/Alertness: Awake/alert Behavior During Therapy: Anxious;Agitated;WFL for tasks assessed/performed Overall Cognitive Status: Within Functional Limits for tasks assessed                      Exercises      General Comments General comments (skin  integrity, edema, etc.): Pt noted to have red blister on R medial thigh.  RN notified and pillowcase used as a barrier between the [place] and pt's skin.  Pt reports it is not painful to palpation.      Pertinent Vitals/Pain Pain Assessment: Faces Faces Pain Scale: Hurts a little bit Pain Location: L knee Pain Descriptors / Indicators: Grimacing;Guarding Pain Intervention(s): Limited activity within patient's tolerance;Monitored during session;Repositioned    Home Living                      Prior Function            PT Goals (current goals can now be found in the care plan section) Acute Rehab PT Goals Patient Stated Goal: decreased pain PT Goal Formulation: With patient Time For Goal Achievement: 10/29/16 Potential to Achieve Goals: Fair Progress towards PT goals: Progressing toward goals    Frequency    BID      PT Plan Current plan remains appropriate    Co-evaluation             End of Session Equipment Utilized During Treatment: Gait belt Activity Tolerance: Patient limited by pain Patient left: with call bell/phone within reach;Other (comment);in chair;with chair alarm set (bil Prevalon boots in place)     Time: [id number] PT Time Calculation (min) (ACUTE ONLY): 41 min  Charges:  $Gait Training: 23-37 mins $Therapeutic Activity: 8-22 mins                    G Codes:      Collie Siad PT, DPT 10/18/2016, 11:32 AM

## 2016-10-18 NOTE — Plan of Care (Signed)
Problem: Bowel/Gastric: Goal: Will not experience complications related to bowel motility Outcome: Progressing Pt is progressing toward d/c. Plan remains to send pt home with home health. Pt remains free of falls this shift, did develop a new blood blister type area to L medial thigh, this was dressed, pt stated he did not know how this occurred. Per durgeon, pt is clear for discharge from hsi persepctive.

## 2016-10-18 NOTE — Progress Notes (Signed)
  Subjective: 4 Days Post-Op Procedure(s) (LRB): OPEN REDUCTION INTERNAL (ORIF) FIXATION PATELLA (Left) Patient reports pain as moderate.   Patient has had BM, improved ambulation with PT yesterday. Denies any CP, SOB, ABD pain. We will continue therapy today.  Plan is to go Skilled nursing facility after hospital stay.  Objective: Vital signs in last 24 hours: Temp:  [97.4 F (36.3 C)-99.9 F (37.7 C)] 97.4 F (36.3 C) (11/10 0740) Pulse Rate:  [58-86] 62 (11/10 0740) Resp:  [18-20] 19 (11/10 0740) BP: (88-112)/(42-59) 112/59 (11/10 0740) SpO2:  [100 %] 100 % (11/10 0740) Weight:  [77.2 kg (170 lb 3.2 oz)] 77.2 kg (170 lb 3.2 oz) (11/10 0320)  Intake/Output from previous day: 11/09 0701 - 11/10 0700 In: 3 [I.V.:3] Out: 0  Intake/Output this shift: Total I/O In: 3 [I.V.:3] Out: -    Recent Labs  10/17/16 0322  HGB 7.4*    Recent Labs  10/17/16 0322  WBC 8.0  RBC 2.49*  HCT 22.6*  PLT 287    Recent Labs  10/17/16 0321  CREATININE 0.97   No results for input(s): LABPT, INR in the last 72 hours.  EXAM General - Patient is Alert, Appropriate and Oriented Extremity - Neurovascular intact Sensation intact distally Intact pulses distally Dorsiflexion/Plantar flexion intact No cellulitis present Compartment soft  Left heel ulcer with intact dressing. Dressing - dressing C/D/I. Dc wound vac.New dressing applied Motor Function - intact, moving foot and toes well on exam.   Past Medical History:  Diagnosis Date  . Anemia   . Asthma   . BPH (benign prostatic hyperplasia)   . COPD (chronic obstructive pulmonary disease) (HCC)    not on home oxygen  . Coronary artery disease   . Hypotension   . Hypothyroidism   . Skin abnormalities   . Skin cancer     Assessment/Plan:   4 Days Post-Op Procedure(s) (LRB): OPEN REDUCTION INTERNAL (ORIF) FIXATION PATELLA (Left) Active Problems:   Cellulitis   Pressure injury of skin  Estimated body mass index is  24.42 kg/m as calculated from the following:   Height as of this encounter: 5\' 10"  (1.778 m).   Weight as of this encounter: 77.2 kg (170 lb 3.2 oz). Advance diet Up with therapy, WBAT LLE. PT to avoid ALL KNEE RANGE OF MOTION.  Continue with IV abx       DVT Prophylaxis - Lovenox, Foot Pumps and TED hose Weight-Bearing as tolerated to left leg   T. Rachelle Hora, PA-C [place] 10/18/2016, 1:46 PM

## 2016-10-18 NOTE — Progress Notes (Signed)
Physical therapy communicated to this writer that pt is now observed to have what may be a dark blister to his L upper inner thigh. This Probation officer checked pt, he has a flat dark red area that appears to have the top layer of skin beginning to raise up to his L inner thigh, this was cleaned with saline and foam dressing applied. Area is flat and approx the size of a nickel.

## 2016-10-18 NOTE — Progress Notes (Signed)
Pharmacy Antibiotic Note  Kurt Hill is a 82 y.o. male admitted on 10/13/2016 with wound infection (wound VAC left knee) and pressure injury on heel.  Pharmacy has been consulted for vancomycin dosing.  This is day #4 of antibiotics  Plan: 11/8 1540 VT = 18 mcg/mL (therapeutic) with vancomycin trough goal of 15-20 mcg/mL for wound infection. Continue current regimen of vancomycin 1000 mg IV q18h. Will check SCr with AM labs tomorrow. If SCr stable, consider rechecking level in a few days.  11/10:  VT @ 2137 = 36 mcg/mL .   Vanc dose was hung @ 2113, therefore this is not a true trough.    Will continue this pt on current dose of Vancomycin 1 gm IV Q18H and draw another VT before next dose on 11/11 @ 15:30.   Height: 5\' 10"  (177.8 cm) Weight: 170 lb 3.2 oz (77.2 kg) IBW/kg (Calculated) : 73  Temp (24hrs), Avg:97.6 F (36.4 C), Min:97.4 F (36.3 C), Max:97.9 F (36.6 C)   Recent Labs Lab 10/13/16 0142 10/14/16 1341 10/16/16 1540 10/17/16 0321 10/17/16 0322 10/18/16 2137  WBC 8.5 8.0  --   --  8.0  --   CREATININE 1.20 0.92  --  0.97  --   --   VANCOTROUGH  --   --  18  --   --  36*    Estimated Creatinine Clearance: 60.6 mL/min (by C-G formula based on SCr of 0.97 mg/dL).    Allergies  Allergen Reactions  . Ivp Dye [Iodinated Diagnostic Agents] Itching and Rash  . Sulfa Antibiotics Rash   Antimicrobials this admission: Vancomycin 11/5 >>     Dose adjustments this admission: N/A  Microbiology results: 11/5 BCx: No growth 3 days 11/5 MRSA PCR: Positive  Thank you for allowing pharmacy to be a part of this patient's care.  Amaurie Schreckengost D, Pharm.D. Clinical Pharmacist 10/18/2016 10:13 PM

## 2016-10-18 NOTE — Care Management (Signed)
Barrier to discharge: Wound vac removed yesterday. Working with PT. Plan is home at discharge with [facility] following.

## 2016-10-18 NOTE — Progress Notes (Signed)
Kurt Hill at [place] NAME: Kurt Hill    MR#:  [id number]  DATE OF BIRTH:  [birth year]  SUBJECTIVE: And has hiccups  this morning. Walked in the room with physical therapy. .  CHIEF COMPLAINT:   Chief Complaint  Patient presents with  . Fall  . Ear Laceration     REVIEW OF SYSTEMS:    Review of Systems  Constitutional: Positive for malaise/fatigue. Negative for chills and fever.  HENT: Negative for sore throat.   Eyes: Negative for blurred vision, double vision and pain.  Respiratory: Negative for cough, hemoptysis, shortness of breath and wheezing.   Cardiovascular: Negative for chest pain, palpitations, orthopnea and leg swelling.  Gastrointestinal: Negative for abdominal pain, constipation, diarrhea, heartburn, nausea and vomiting.  Genitourinary: Negative for dysuria and hematuria.  Musculoskeletal: Positive for back pain, falls and joint pain.  Skin: Negative for rash.  Neurological: Positive for weakness. Negative for sensory change, speech change, focal weakness and headaches.  Endo/Heme/Allergies: Does not bruise/bleed easily.  Psychiatric/Behavioral: Negative for depression. The patient is not nervous/anxious.     DRUG ALLERGIES:   Allergies  Allergen Reactions  . Ivp Dye [Iodinated Diagnostic Agents] Itching and Rash  . Sulfa Antibiotics Rash    VITALS:  Blood pressure (!) 112/59, pulse 62, temperature 97.4 F (36.3 C), temperature source Oral, resp. rate 19, height 5\' 10"  (1.778 m), weight 77.2 kg (170 lb 3.2 oz), SpO2 100 %.  PHYSICAL EXAMINATION:   Physical Exam  GENERAL:  82 y.o.-year-old patient lying in the bed with no acute distress.  EYES: Pupils equal, round, reactive to light and accommodation. No scleral icterus. Extraocular muscles intact.  LUNGS: Normal breath sounds bilaterally, no wheezing, rales, rhonchi. No use of accessory muscles of respiration.  CARDIOVASCULAR: S1, S2 normal. No murmurs, rubs,  or gallops.  ABDOMEN: Soft, nontender, nondistended. Bowel sounds present. No organomegaly or mass.  EXTREMITIES: The knee wound VAC present left heel black eschar present reviewed this wound with register nurse while she was doing dressing changes. PSYCHIATRIC: The patient is alert and oriented x 3.   Left knee wound and left heel ulcer and a dressing  LABORATORY PANEL:   CBC  Recent Labs Lab 10/17/16 0322  WBC 8.0  HGB 7.4*  HCT 22.6*  PLT 287   ------------------------------------------------------------------------------------------------------------------ Chemistries   Recent Labs Lab 10/13/16 0142  10/17/16 0321  NA 134*  --   --   K 4.3  --   --   CL 96*  --   --   CO2 29  --   --   GLUCOSE 138*  --   --   BUN 23*  --   --   CREATININE 1.20  < > 0.97  CALCIUM 9.1  --   --   < > = values in this interval not displayed. ------------------------------------------------------------------------------------------------------------------  Cardiac Enzymes No results for input(s): TROPONINI in the last 168 hours. ------------------------------------------------------------------------------------------------------------------  RADIOLOGY:  No results found.   ASSESSMENT AND PLAN:   This is an 82 year old male admitted for recurrent cellulitis.  1. Left knee wound dehiscence,, hardware infection: Status post surgery, open reduction and fixation, left patella. Antibiotic beads placed  Inside knee. VAC was removed  By ortho  yesterday, continue physical therapy,    LEFT HEEL ULCER;Podiatry consulted.wound care consult ed, needs a pressure relieving mattress,  dressing wet-to-dry with santyl.change dressign daily  seen By wound care nurse. . 2. COPD: Stable; continue inhaled corticosteroid and albuterol  as needed  3. Hypothyroidism: Continue Synthroid   4. BPH: Continue Proscar #5 hypotension: Midodrine dose is increased. Pressure is better.  6. constipation,  patient abdomen is soft, good bowel sounds present. Continue stool softeners, patient requesting Ex-Lax.  #7 hiccups;: Started on Compazine.  D/w RN,pt. All the records are reviewed and case discussed with Care Management/Social Workerr. Management plans discussed with the patient, family and they are in agreement.  CODE STATUS: FULL  DVT Prophylaxis: SCDs  TOTAL TIME TAKING CARE OF THIS PATIENT:30 minutes.   POSSIBLE D/C IN 3-4 DAYS, DEPENDING ON CLINICAL CONDITION.  Kurt Hill M.D on 10/18/2016 at 12:34 PM  Between 7am to 6pm - Pager - [phone]  After 6pm go to [email or web address] - password [email or web address] [facility]  Office  [phone]  CC: Primary care physician; Kurt Harrier, MD  Note: This dictation was prepared with Dragon dictation along with smaller phrase technology. Any transcriptional errors that result from this process are unintentional.

## 2016-10-18 NOTE — Anesthesia Postprocedure Evaluation (Signed)
Anesthesia Post Note  Patient: Kurt Hill  Procedure(s) Performed: Procedure(s) (LRB): OPEN REDUCTION INTERNAL (ORIF) FIXATION PATELLA (Left)  Patient location during evaluation: PACU Anesthesia Type: General Level of consciousness: awake and alert Pain management: pain level controlled Vital Signs Assessment: post-procedure vital signs reviewed and stable Respiratory status: spontaneous breathing, nonlabored ventilation, respiratory function stable and patient connected to nasal cannula oxygen Cardiovascular status: blood pressure returned to baseline and stable Postop Assessment: no signs of nausea or vomiting Anesthetic complications: no    Last Vitals:  Vitals:   10/18/16 0320 10/18/16 0740  BP: (!) 110/50 (!) 112/59  Pulse: 64 62  Resp: 18 19  Temp: 36.4 C 36.3 C    Last Pain:  Vitals:   10/18/16 0803  TempSrc:   PainSc: 0-No pain                 Molli Barrows

## 2016-10-19 LAB — CBC
HEMATOCRIT: 21.7 % — AB (ref 40.0–52.0)
HEMOGLOBIN: 7.1 g/dL — AB (ref 13.0–18.0)
MCH: 29.8 pg (ref 26.0–34.0)
MCHC: 32.9 g/dL (ref 32.0–36.0)
MCV: 90.7 fL (ref 80.0–100.0)
Platelets: 382 10*3/uL (ref 150–440)
RBC: 2.39 MIL/uL — ABNORMAL LOW (ref 4.40–5.90)
RDW: 15.2 % — ABNORMAL HIGH (ref 11.5–14.5)
WBC: 6.7 10*3/uL (ref 3.8–10.6)

## 2016-10-19 LAB — BASIC METABOLIC PANEL
ANION GAP: 5 (ref 5–15)
BUN: 20 mg/dL (ref 6–20)
CHLORIDE: 101 mmol/L (ref 101–111)
CO2: 32 mmol/L (ref 22–32)
Calcium: 8.2 mg/dL — ABNORMAL LOW (ref 8.9–10.3)
Creatinine, Ser: 0.86 mg/dL (ref 0.61–1.24)
GFR calc Af Amer: >60 mL/min (ref >60)
GLUCOSE: 100 mg/dL — AB (ref 65–99)
Potassium: 4.7 mmol/L (ref 3.5–5.1)
Sodium: 138 mmol/L (ref 135–145)

## 2016-10-19 LAB — VANCOMYCIN, TROUGH

## 2016-10-19 MED ORDER — SULFAMETHOXAZOLE-TRIMETHOPRIM 800-160 MG PO TABS: 1.0000 | ORAL_TABLET | Freq: Two times a day (BID) | ORAL | 0 refills | Status: DC

## 2016-10-19 MED ORDER — COLLAGENASE 250 UNIT/GM EX OINT: TOPICAL_OINTMENT | Freq: Every day | CUTANEOUS | 0 refills | Status: DC

## 2016-10-19 MED ORDER — DOXYCYCLINE HYCLATE 100 MG PO TABS
100.0000 mg | ORAL_TABLET | Freq: Two times a day (BID) | ORAL | Status: DC
  Administered 2016-10-19 – 2016-10-21 (×5): 100 mg via ORAL
  Filled 2016-10-19 (×5): qty 1

## 2016-10-19 MED ORDER — POLYETHYLENE GLYCOL 3350 17 G PO PACK: 17.0000 g | PACK | Freq: Every day | ORAL | 0 refills | Status: AC

## 2016-10-19 MED ORDER — MIDODRINE HCL 5 MG PO TABS: 5.0000 mg | ORAL_TABLET | Freq: Three times a day (TID) | ORAL | 0 refills | Status: DC

## 2016-10-19 MED ORDER — OXYCODONE HCL 5 MG PO TABS: 5.0000 mg | ORAL_TABLET | ORAL | 0 refills | Status: DC | PRN

## 2016-10-19 MED ORDER — DOXYCYCLINE HYCLATE 100 MG PO TABS: 100.0000 mg | ORAL_TABLET | Freq: Two times a day (BID) | ORAL | 0 refills | Status: AC

## 2016-10-19 MED ORDER — ENSURE ENLIVE PO LIQD: 237.0000 mL | Freq: Two times a day (BID) | ORAL | 12 refills | Status: AC

## 2016-10-19 NOTE — Discharge Summary (Addendum)
[place] at [place] NAME: Kurt Hill    MR#:  [id number]  DATE OF BIRTH:  Oct 04, 1932  DATE OF ADMISSION:  10/13/2016 ADMITTING PHYSICIAN: Harrie Foreman, MD  DATE OF DISCHARGE: 10/21/2016  PRIMARY CARE PHYSICIAN: Tracie Harrier, MD    ADMISSION DIAGNOSIS:  Fall [W19.XXXA]  DISCHARGE DIAGNOSIS:  Active Problems:   Cellulitis   Pressure injury of skin   SECONDARY DIAGNOSIS:   Past Medical History:  Diagnosis Date  . Anemia   . Asthma   . BPH (benign prostatic hyperplasia)   . COPD (chronic obstructive pulmonary disease) (HCC)    not on home oxygen  . Coronary artery disease   . Hypotension   . Hypothyroidism   . Skin abnormalities   . Skin cancer     HOSPITAL COURSE:   82 year old male with a history of hypothyroidism who presented after mechanical fall with failed patellar fixation and left heel ulcer  1. Failed patellar fixation with left knee wound dehiscence and hardware infection: Patient is status post ORIF left patella. The IV antibiotics were changed to by mouth DOXY for 10 days from 11/13 /2017 (as per sensitivities from September 2017). Due to open wound he has a wound vac [;aced that will need to be changed in 7 days.  Patient can follow-up with orthopedic surgery in a week. Physical therapy is recommended skilled nursing facility at discharge  2. Left heel ulcer: Evaluated by podiatry. Cleanse left heel ulcer with soap and water and pat gently dry. Apply Santyl to wound bed. Cover with NS moist 2x2. Secure with 4x4 gauze and kerlix/tape, Change daily Continue with a heel protecting boot. Patient can follow up with Dr. Selina Cooley outpatient in 2 weeks from discharge.   3. COPD without exacerbation: Continue inhalers  4. Hypothyroid: Continue Synthroid  5. BPH: Continue Proscar  6. Asymptomatic hypotension: Continue midodrine 7. 7. Acute on chronic anemia baseline hgb 10-12: Hgb  stable.  DISCHARGE CONDITIONS AND DIET:   Stable Regular diet  CONSULTS OBTAINED:  Treatment Team:  Albertine Patricia, DPM  DRUG ALLERGIES:   Allergies  Allergen Reactions  . Ivp Dye [Iodinated Diagnostic Agents] Itching and Rash  . Sulfa Antibiotics Rash    DISCHARGE MEDICATIONS:   Current Discharge Medication List    START taking these medications   Details  collagenase (SANTYL) ointment Apply topically daily. Qty: 15 g, Refills: 0    doxycycline (VIBRA-TABS) 100 MG tablet Take 1 tablet (100 mg total) by mouth every 12 (twelve) hours. Qty: 18 tablet, Refills: 0    feeding supplement, ENSURE ENLIVE, (ENSURE ENLIVE) LIQD Take 237 mLs by mouth 2 (two) times daily between meals. Qty: 237 mL, Refills: 12    midodrine (PROAMATINE) 5 MG tablet Take 1 tablet (5 mg total) by mouth 3 (three) times daily with meals. Qty: 90 tablet, Refills: 0    polyethylene glycol (MIRALAX / GLYCOLAX) packet Take 17 g by mouth daily. Qty: 14 each, Refills: 0      CONTINUE these medications which have CHANGED   Details  oxyCODONE (OXY IR/ROXICODONE) 5 MG immediate release tablet Take 1 tablet (5 mg total) by mouth every 4 (four) hours as needed for moderate pain or severe pain. Qty: 30 tablet, Refills: 0      CONTINUE these medications which have NOT CHANGED   Details  !! albuterol (PROAIR HFA) 108 (90 Base) MCG/ACT inhaler Inhale 2 puffs into the lungs every 6 (six) hours as needed for wheezing or  shortness of breath.     !! albuterol (PROVENTIL HFA;VENTOLIN HFA) 108 (90 Base) MCG/ACT inhaler Inhale 2 puffs into the lungs 4 (four) times daily as needed for wheezing.    albuterol (PROVENTIL) (2.5 MG/3ML) 0.083% nebulizer solution Take 2.5 mg by nebulization every 6 (six) hours as needed for wheezing or shortness of breath.    alum & mag hydroxide-simeth (MYLANTA) I037812 MG/5ML suspension Take 30 mLs by mouth every 6 (six) hours as needed for indigestion or heartburn.    aspirin EC 81  MG tablet Take 81 mg by mouth every morning.    budesonide-formoterol (SYMBICORT) 80-4.5 MCG/ACT inhaler Inhale 2 puffs into the lungs 2 (two) times daily.    finasteride (PROSCAR) 5 MG tablet Take 5 mg by mouth every morning.    lactulose (CHRONULAC) 10 GM/15ML solution Take 45 mLs (30 g total) by mouth daily as needed for moderate constipation. Qty: 473 mL, Refills: 0    levothyroxine (SYNTHROID, LEVOTHROID) 100 MCG tablet Take 1 tablet by mouth daily.    mupirocin ointment (BACTROBAN) 2 % Place 1 application into the nose 2 (two) times daily. Qty: 22 g, Refills: 0    Vitamin D, Ergocalciferol, (DRISDOL) 50000 units CAPS capsule Take 1 capsule by mouth every 7 (seven) days.     !! - Potential duplicate medications found. Please discuss with provider.    STOP taking these medications     HYDROcodone-acetaminophen (NORCO/VICODIN) 5-325 MG tablet               Today   CHIEF COMPLAINT:  Doing ok no issues overnight   VITAL SIGNS:  Blood pressure (!) 99/57, pulse 76, temperature 98.7 F (37.1 C), temperature source Oral, resp. rate 18, height 5\' 10"  (1.778 m), weight 73.1 kg (161 lb 3.8 oz), SpO2 99 %.   REVIEW OF SYSTEMS:  Review of Systems  Constitutional: Negative.  Negative for chills, fever and malaise/fatigue.  HENT: Negative.  Negative for ear discharge, ear pain, hearing loss, nosebleeds and sore throat.   Eyes: Negative.  Negative for blurred vision and pain.  Respiratory: Negative.  Negative for cough, hemoptysis, shortness of breath and wheezing.   Cardiovascular: Negative.  Negative for chest pain, palpitations and leg swelling.  Gastrointestinal: Negative.  Negative for abdominal pain, blood in stool, diarrhea, nausea and vomiting.  Genitourinary: Negative.  Negative for dysuria.  Musculoskeletal: Positive for joint pain. Negative for back pain.  Skin: Negative.   Neurological: Negative for dizziness, tremors, speech change, focal weakness, seizures and  headaches.  Endo/Heme/Allergies: Negative.  Does not bruise/bleed easily.  Psychiatric/Behavioral: Negative.  Negative for depression, hallucinations and suicidal ideas.     PHYSICAL EXAMINATION:  GENERAL:  82 y.o.-year-old patient lying in the bed with no acute distress.  NECK:  Supple, no jugular venous distention. No thyroid enlargement, no tenderness.  LUNGS: Normal breath sounds bilaterally, no wheezing, rales,rhonchi  No use of accessory muscles of respiration.  CARDIOVASCULAR: S1, S2 normal. No murmurs, rubs, or gallops.  ABDOMEN: Soft, non-tender, non-distended. Bowel sounds present. No organomegaly or mass.  EXTREMITIES: No pedal edema, cyanosis, or clubbing.  Dressing placed PSYCHIATRIC: The patient is alert and oriented x 3.  SKIN: No obvious rash, lesion, or ulcer.   DATA REVIEW:   CBC  Recent Labs Lab 10/21/16 0440  WBC 5.8  HGB 7.8*  HCT 24.3*  PLT 500*    Chemistries   Recent Labs Lab 10/19/16 0318 10/21/16 0440  NA 138  --   K 4.7  --  CL 101  --   CO2 32  --   GLUCOSE 100*  --   BUN 20  --   CREATININE 0.86 0.86  CALCIUM 8.2*  --     Cardiac Enzymes No results for input(s): TROPONINI in the last 168 hours.  Microbiology Results  @[email or web address] @  RADIOLOGY:  No results found.    Management plans discussed with the patient and he is in agreement. Stable for discharge   Patient should follow up with pcp and ortho  CODE STATUS:     Code Status Orders        Start     Ordered   10/13/16 0547  Full code  Continuous     10/13/16 0546    Code Status History    Date Active Date Inactive Code Status Order ID Comments User Context   08/30/2016 11:58 AM 09/02/2016  7:47 PM Full Code [id number]  Hessie Knows, MD Inpatient   08/15/2016  3:18 PM 08/18/2016  8:03 PM Full Code [id number]  Hessie Knows, MD Inpatient   07/30/2016  4:27 PM 08/01/2016  7:55 PM Full Code [id number]  Hessie Knows, MD ED   12/14/2015  3:03 PM 12/15/2015  6:03 PM Full Code  [id number]  Gladstone Lighter, MD Inpatient      TOTAL TIME TAKING CARE OF THIS PATIENT: 36 minutes.    Note: This dictation was prepared with Dragon dictation along with smaller phrase technology. Any transcriptional errors that result from this process are unintentional.  Phyllistine Domingos M.D on 10/21/2016 at 10:36 AM  Between 7am to 6pm - Pager - [phone] After 6pm go to [email or web address] - password [email or web address] [facility]  Office  [phone]  CC: Primary care physician; Tracie Harrier, MD

## 2016-10-19 NOTE — Progress Notes (Signed)
Physical Therapy Treatment Patient Details Name: Kurt Hill MRN: [id number] DOB: Dec 18, 1931 Today's Date: 10/19/2016    History of Present Illness Pt is a 82 y/o M who presented to the ED after a mechanical fall.  He has had two previous revisions of his ORIF L patella due to recurrent infection.  Pt was found to have dehiscence of his surgical incision and a severe laceration to his L ear.  Due to his fall he lost fixation of L patella and is now s/p ORIF L patella (11/6).  Additionally, pt has a sore on his posterior L heel.  Pt's PMH includes COPD, hypothyroidism, skin cancer, ORIF L patella (07/30/16 and 08/15/16).    PT Comments    Pt very pleasant and agreeable to PT.  Pt determine to get to side of bed without PT assist and pt was able to accomplish this.  Pt still needs Min A for balance during transfers sit<>stand.  Pt amb short distance in room, continuing to c/o fatigue.  Pt very slow with mobility, often pausing to communicate with PT, requiring extra time to complete all tasks.  Cont with POC.   Follow Up Recommendations  SNF     Equipment Recommendations   (defer to post acute)    Recommendations for Other Services OT consult     Precautions / Restrictions Precautions Precautions: Fall Precaution Comments: No PROM or AROM of the knee. Spoke with Podiatrist Dr. Elvina Mattes prior to evaluation who confirmed pt may be WBAT LLE as heel ulcer is posterior. Required Braces or Orthoses: Knee Immobilizer - Left Restrictions LLE Weight Bearing: Weight bearing as tolerated    Mobility  Bed Mobility         Supine to sit: Min guard     General bed mobility comments: Determined to get to EOB without help; HOB elevated and bed rail used  Transfers   Equipment used: Rolling walker (2 wheeled)   Sit to Stand: Min assist         General transfer comment: Assist for balance during hand transfer from bed to RW  Ambulation/Gait Ambulation/Gait assistance: Min  guard Ambulation Distance (Feet): 15 Feet Assistive device: Rolling walker (2 wheeled) Gait Pattern/deviations: Antalgic     General Gait Details: Limited by fatigue; gait slow and pt easily distracted, unable to dual task during gait (walk and talk)   Financial trader Rankin (Stroke Patients Only)       Balance                                    Cognition Arousal/Alertness: Awake/alert Behavior During Therapy: WFL for tasks assessed/performed Overall Cognitive Status: Within Functional Limits for tasks assessed                      Exercises      General Comments        Pertinent Vitals/Pain Pain Location: L knee Pain Intervention(s): Limited activity within patient's tolerance;Premedicated before session    Home Living                      Prior Function            PT Goals (current goals can now be found in the care plan section) Acute Rehab PT Goals Patient Stated Goal: decreased  pain PT Goal Formulation: With patient Time For Goal Achievement: 10/29/16 Potential to Achieve Goals: Good Progress towards PT goals: Progressing toward goals    Frequency    BID      PT Plan Current plan remains appropriate    Co-evaluation             End of Session Equipment Utilized During Treatment: Gait belt Activity Tolerance: Patient limited by fatigue Patient left: in chair;with call bell/phone within reach;with chair alarm set     Time: [id number] PT Time Calculation (min) (ACUTE ONLY): 30 min  Charges:  $Gait Training: 8-22 mins $Therapeutic Activity: 8-22 mins                    G Codes:      Tell Rozelle A Olukemi Panchal 11-08-2016, 4:48 PM

## 2016-10-19 NOTE — Progress Notes (Addendum)
  Subjective: 5 Days Post-Op Procedure(s) (LRB): OPEN REDUCTION INTERNAL (ORIF) FIXATION PATELLA (Left) Patient reports pain as moderate.  Lightheadedness resolved. Tolerated PT well yesterday Patient complains of hiccups. Taking oral thorazine.  Denies any CP, SOB, ABD pain. We will continue therapy today.  Plan is to go Skilled nursing facility after hospital stay.  Objective: Vital signs in last 24 hours: Temp:  [97.9 F (36.6 C)-98.2 F (36.8 C)] 98.2 F (36.8 C) (11/11 0719) Pulse Rate:  [63-72] 72 (11/11 0719) Resp:  [16-18] 18 (11/11 0719) BP: (101-121)/(49-61) 110/52 (11/11 0719) SpO2:  [97 %-99 %] 99 % (11/11 0719) Weight:  [74.6 kg (164 lb 8.7 oz)] 74.6 kg (164 lb 8.7 oz) (11/11 0421)  Intake/Output from previous day: 11/10 0701 - 11/11 0700 In: 3 [I.V.:3] Out: 1000 [Urine:1000] Intake/Output this shift: No intake/output data recorded.   Recent Labs  10/17/16 0322 10/19/16 0318  HGB 7.4* 7.1*    Recent Labs  10/17/16 0322 10/19/16 0318  WBC 8.0 6.7  RBC 2.49* 2.39*  HCT 22.6* 21.7*  PLT 287 382    Recent Labs  10/17/16 0321 10/19/16 0318  NA  --  138  K  --  4.7  CL  --  101  CO2  --  32  BUN  --  20  CREATININE 0.97 0.86  GLUCOSE  --  100*  CALCIUM  --  8.2*   No results for input(s): LABPT, INR in the last 72 hours.  EXAM General - Patient is Alert, Appropriate and Oriented Extremity - Neurovascular intact Sensation intact distally Intact pulses distally Dorsiflexion/Plantar flexion intact No cellulitis present Compartment soft  Left heel ulcer with intact dressing. Dressing - dressing C/D/I and moderate drainage. Dressing chagned Motor Function - intact, moving foot and toes well on exam.   Past Medical History:  Diagnosis Date  . Anemia   . Asthma   . BPH (benign prostatic hyperplasia)   . COPD (chronic obstructive pulmonary disease) (HCC)    not on home oxygen  . Coronary artery disease   . Hypotension   . Hypothyroidism    . Skin abnormalities   . Skin cancer     Assessment/Plan:   5 Days Post-Op Procedure(s) (LRB): OPEN REDUCTION INTERNAL (ORIF) FIXATION PATELLA (Left) Active Problems:   Cellulitis   Pressure injury of skin  Estimated body mass index is 23.61 kg/m as calculated from the following:   Height as of this encounter: 5\' 10"  (1.778 m).   Weight as of this encounter: 74.6 kg (164 lb 8.7 oz). Advance diet Up with therapy, WBAT LLE. PT to avoid ALL KNEE RANGE OF MOTION.  Ok to discharge to SNF today pending medical clearance. Ok to d/c IV abx and start oral abx, Doxycycline x 10 days. Follow up with [facility] in 10 days for staple removal.       DVT Prophylaxis - Lovenox, Foot Pumps and TED hose Weight-Bearing as tolerated to left leg   T. Rachelle Hora, PA-C [place] 10/19/2016, 9:31 AM

## 2016-10-19 NOTE — Progress Notes (Signed)
Shift assessment completed at 0815. Pt resting in bed, alert and oriented, on room air, lungs clear bilat. Pt c/o some pain to his L rib area from the fall resulting in admission. Hr is regular, Abdomen is soft, bs heard. Pt continues to have occasional hiccups. Pt has brief on for incontinence, piv #22 intact to LFA. L knee has honeycomb dressing intact that is nearly saturated with dried blood, knee appears bruised. L heel dressing is intact. R leg has scd in place, both feet with cap refill wnl, and ppp. Pt also has sutures intact to l ear that are dry, laceration above this to l side of head is approximated and dry. Since assessment completed, pt has worked with physical therapy twice, and honeycomb dressing was changed by Pt. L heel dressing changed by this Probation officer. Area is now partial thickness wound with some bleeding noted when area cleaned with saline, santyl was applied as well. bilat blue boots in place. Pt has foam dressing to top l medial thigh, no drainage noted. Pt has received thorazine for hiccups with little relief, and pt has received pain medication prior to second time of physical therapy. This Probation officer assissted pt back to bed with walker both times, and pt cites some dizziness on standing but recovers. Pt decided during assessment this morning that he was agreeable to d/c to SNF, case management and dr. Are aware of this.

## 2016-10-19 NOTE — Plan of Care (Signed)
Problem: Bowel/Gastric: Goal: Will not experience complications related to bowel motility Outcome: Progressing Pt is cooperating with physical therapy and working toward goals for discharge. Pt has remained free of falls/injury this shift.

## 2016-10-19 NOTE — Progress Notes (Addendum)
[facility] [place] NAME: Kurt Hill    MR#:  [id number]  DATE OF BIRTH:  October 20, 1932  SUBJECTIVE:   Patient wants to go to SNF now  Pain is controlled this am REVIEW OF SYSTEMS:    Review of Systems  Constitutional: Negative.  Negative for chills, fever and malaise/fatigue.  HENT: Negative.  Negative for ear discharge, ear pain, hearing loss, nosebleeds and sore throat.   Eyes: Negative.  Negative for blurred vision and pain.  Respiratory: Negative.  Negative for cough, hemoptysis, shortness of breath and wheezing.   Cardiovascular: Negative.  Negative for chest pain, palpitations and leg swelling.  Gastrointestinal: Negative.  Negative for abdominal pain, blood in stool, diarrhea, nausea and vomiting.  Genitourinary: Negative.  Negative for dysuria.  Musculoskeletal: Negative.  Negative for back pain.  Skin: Negative.   Neurological: Negative for dizziness, tremors, speech change, focal weakness, seizures and headaches.  Endo/Heme/Allergies: Negative.  Does not bruise/bleed easily.  Psychiatric/Behavioral: Negative.  Negative for depression, hallucinations and suicidal ideas.    Tolerating Diet: yes      DRUG ALLERGIES:   Allergies  Allergen Reactions  . Ivp Dye [Iodinated Diagnostic Agents] Itching and Rash  . Sulfa Antibiotics Rash    VITALS:  Blood pressure (!) 110/52, pulse 72, temperature 98.2 F (36.8 C), temperature source Oral, resp. rate 18, height 5\' 10"  (1.778 m), weight 74.6 kg (164 lb 8.7 oz), SpO2 99 %.  PHYSICAL EXAMINATION:   Physical Exam    LABORATORY PANEL:   CBC  Recent Labs Lab 10/19/16 0318  WBC 6.7  HGB 7.1*  HCT 21.7*  PLT 382   ------------------------------------------------------------------------------------------------------------------  Chemistries   Recent Labs Lab 10/19/16 0318  NA 138  K 4.7  CL 101  CO2 32  GLUCOSE 100*  BUN 20  CREATININE 0.86  CALCIUM 8.2*    ------------------------------------------------------------------------------------------------------------------  Cardiac Enzymes No results for input(s): TROPONINI in the last 168 hours. ------------------------------------------------------------------------------------------------------------------  RADIOLOGY:  No results found.   ASSESSMENT AND PLAN:    82 year old male with a history of hypothyroidism who presented after mechanical fall with failed patellar fixation and left heel ulcer  1. Failed patellar fixation with left knee wound dehiscence and hardware infection: Patient is postoperative day #5 status post ORIF left patella. IV antibiotics changed to by mouth DOXY for 10 days as per sensitivities from September 2017. Patient can follow-up with orthopedic surgery in 10 days. Physical therapy is recommended skilled nursing facility at discharge  2. Left heel ulcer: Evaluated by podiatry. Cleanse left heel ulcer with soap and water and pat gently dry.  Apply Santyl to wound bed.  Cover with NS moist 2x2.  Secure with 4x4 gauze and kerlix/tape,  Change daily Continue with a heel protecting boot. Patient can follow up with Dr. Selina Cooley outpatient in 2 weeks.   3. COPD without exacerbation: Continue inhalers  4. Hypothyroid: Continue Synthroid  5. BPH: Continue Proscar  6. Asymptomatic hypotension: Continue midodrine  7. Acute on chronic anemia baseline hgb 10-12: Hgb 7.1 will transfuse in am if 7 or less which may help with BP. Suspect blood loss from surgery and chronic illness. D/w Rachelle Hora Management plans discussed with the patient and he is in agreement.  CODE STATUS: full  TOTAL TIME TAKING CARE OF THIS PATIENT: 30 minutes.     POSSIBLE D/C today, DEPENDING ON CLINICAL CONDITION.   [name] M.D on 10/19/2016 at 11:00 AM  Between 7am to 6pm -  Pager - [phone] After 6pm go to [email or web address] - password [email or web address]  Office  [phone]  CC: Primary care physician; Tracie Harrier, MD  Note: This dictation was prepared with Dragon dictation along with smaller phrase technology. Any transcriptional errors that result from this process are unintentional.

## 2016-10-19 NOTE — Discharge Instructions (Signed)
Diet: As you were doing prior to hospitalization   Shower:  May shower but keep the wounds dry, use an occlusive plastic wrap, NO SOAKING IN TUB.  If the bandage gets wet, change with a clean dry gauze.  Dressing:  Change dressing as needed.  Activity:  Increase activity slowly as tolerated, patient may ambulate, weightbearing as tolerated to the left lower extremity. No active or passive range of motion to the left knee with physical therapy. Patient may perform active range of motion as tolerated to the left knee.  Weight Bearing:   Weight bearing as tolerated to left lower extremity  To prevent constipation: you may use a stool softener such as -  Colace (over the counter) 100 mg by mouth twice a day  Drink plenty of fluids (prune juice may be helpful) and high fiber foods Miralax (over the counter) for constipation as needed.    Itching:  If you experience itching with your medications, try taking only a single pain pill, or even half a pain pill at a time.  You may take up to 10 pain pills per day, and you can also use benadryl over the counter for itching or also to help with sleep.   Precautions:  If you experience chest pain or shortness of breath - call 911 immediately for transfer to the hospital emergency department!!  If you develop a fever greater that 101 F, purulent drainage from wound, increased redness or drainage from wound, or calf pain-Call [place]                                              Follow- Up Appointment:  Please call for an appointment to be seen in 10 days at [facility]

## 2016-10-19 NOTE — Progress Notes (Signed)
Physical Therapy Treatment Patient Details Name: Kurt Hill MRN: [id number] DOB: Oct 14, 1932 Today's Date: 10/19/2016    History of Present Illness Pt is a 82 y/o M who presented to the ED after a mechanical fall.  He has had two previous revisions of his ORIF L patella due to recurrent infection.  Pt was found to have dehiscence of his surgical incision and a severe laceration to his L ear.  Due to his fall he lost fixation of L patella and is now s/p ORIF L patella (11/6).  Additionally, pt has a sore on his posterior L heel.  Pt's PMH includes COPD, hypothyroidism, skin cancer, ORIF L patella (07/30/16 and 08/15/16).    PT Comments    Pt requiring extra time and encouragement for all mobility.  Pt initially agitated about situation and wanting to discuss entire history with PT.  Pt motivated to perform transfers independently but was unable and still requires Min A for tranfers.  Gait distance limited by fatigue.  Pt motivated for second PT session later today.  Follow Up Recommendations  SNF     Equipment Recommendations  Other (comment) (defer to post acute)    Recommendations for Other Services OT consult     Precautions / Restrictions Precautions Precautions: Fall Precaution Comments: No PROM or AROM of the knee. Spoke with Podiatrist Dr. Elvina Mattes prior to evaluation who confirmed pt may be WBAT LLE as heel ulcer is posterior. Required Braces or Orthoses: Knee Immobilizer - Left Restrictions LLE Weight Bearing: Weight bearing as tolerated    Mobility  Bed Mobility Overal bed mobility: Needs Assistance       Supine to sit: Min assist;HOB elevated     General bed mobility comments: Slow to move requiring increased time, Min A for L LE management  Transfers Overall transfer level: Needs assistance Equipment used: Rolling walker (2 wheeled)   Sit to Stand: +2 physical assistance;Min assist         General transfer comment: Pt initially trying to stand up without  assist, attempted 3x, and was able to get into partial standing; +2 assist offered for pt's comfort/confidence  Ambulation/Gait Ambulation/Gait assistance: Min guard Ambulation Distance (Feet): 15 Feet Assistive device: Rolling walker (2 wheeled) Gait Pattern/deviations: Antalgic     General Gait Details: Pt with increased fatigue as approaching doorway, pt did not feeling like he could turn around and recliner brought up to pt.  Gait generally slow and steady on level surface for short distance.   Stairs            Wheelchair Mobility    Modified Rankin (Stroke Patients Only)       Balance                                    Cognition Arousal/Alertness: Awake/alert Behavior During Therapy: WFL for tasks assessed/performed Overall Cognitive Status: Within Functional Limits for tasks assessed                      Exercises      General Comments        Pertinent Vitals/Pain Pain Location: L knee Pain Intervention(s): Limited activity within patient's tolerance    Home Living                      Prior Function            PT  Goals (current goals can now be found in the care plan section) Acute Rehab PT Goals Patient Stated Goal: decreased pain PT Goal Formulation: With patient Time For Goal Achievement: 10/29/16 Potential to Achieve Goals: Fair Progress towards PT goals: Progressing toward goals    Frequency    BID      PT Plan Current plan remains appropriate    Co-evaluation             End of Session Equipment Utilized During Treatment: Gait belt Activity Tolerance: Patient limited by fatigue Patient left: in chair;with call bell/phone within reach;with chair alarm set     Time: 1005-1039 PT Time Calculation (min) (ACUTE ONLY): 34 min  Charges:  $Gait Training: 8-22 mins $Therapeutic Activity: 8-22 mins                    G Codes:      Keygan Dumond A Yuridia Couts, PT 20-Oct-2016, 10:50 AM

## 2016-10-20 LAB — CBC
HCT: 23.9 % — ABNORMAL LOW (ref 40.0–52.0)
Hemoglobin: 7.9 g/dL — ABNORMAL LOW (ref 13.0–18.0)
MCH: 29.4 pg (ref 26.0–34.0)
MCHC: 32.8 g/dL (ref 32.0–36.0)
MCV: 89.6 fL (ref 80.0–100.0)
PLATELETS: 484 10*3/uL — AB (ref 150–440)
RBC: 2.67 MIL/uL — AB (ref 4.40–5.90)
RDW: 15.5 % — ABNORMAL HIGH (ref 11.5–14.5)
WBC: 6.8 10*3/uL (ref 3.8–10.6)

## 2016-10-20 NOTE — Progress Notes (Signed)
Patient dressing change on left heel was done prior to my shift this morning. I applied his santyl when scheduled. Wound has eschar in the middle but granulation tissue on the outside. Patients heels ar floating off the bed. Honeycomb left knee dressing intact and clean. Patient not reporting any pain at this time.   Deri Fuelling, RN

## 2016-10-20 NOTE — Progress Notes (Signed)
[place] [facility] at [place] NAME: Kurt Hill    MR#:  [id number]  DATE OF BIRTH:  Jun 02, 1932  SUBJECTIVE:   No acute events overnight Doing well  REVIEW OF SYSTEMS:    Review of Systems  Constitutional: Negative.  Negative for chills, fever and malaise/fatigue.  HENT: Negative.  Negative for ear discharge, ear pain, hearing loss, nosebleeds and sore throat.   Eyes: Negative.  Negative for blurred vision and pain.  Respiratory: Negative.  Negative for cough, hemoptysis, shortness of breath and wheezing.   Cardiovascular: Negative.  Negative for chest pain, palpitations and leg swelling.  Gastrointestinal: Negative.  Negative for abdominal pain, blood in stool, diarrhea, nausea and vomiting.  Genitourinary: Negative.  Negative for dysuria.  Musculoskeletal: Negative.  Negative for back pain.  Skin: Negative.   Neurological: Negative for dizziness, tremors, speech change, focal weakness, seizures and headaches.  Endo/Heme/Allergies: Negative.  Does not bruise/bleed easily.  Psychiatric/Behavioral: Negative.  Negative for depression, hallucinations and suicidal ideas.    Tolerating Diet: yes      DRUG ALLERGIES:   Allergies  Allergen Reactions  . Ivp Dye [Iodinated Diagnostic Agents] Itching and Rash  . Sulfa Antibiotics Rash    VITALS:  Blood pressure (!) 109/59, pulse 63, temperature 97.8 F (36.6 C), temperature source Oral, resp. rate 18, height 5\' 10"  (1.778 m), weight 73.1 kg (161 lb 3.8 oz), SpO2 97 %.  PHYSICAL EXAMINATION:   Physical Exam  Constitutional: He is oriented to person, place, and time and well-developed, well-nourished, and in no distress. No distress.  HENT:  Head: Normocephalic.  Eyes: No scleral icterus.  Neck: Normal range of motion. Neck supple. No JVD present. No tracheal deviation present.  Cardiovascular: Normal rate, regular rhythm and normal heart sounds.  Exam reveals no gallop and no friction rub.    No murmur heard. Pulmonary/Chest: Effort normal and breath sounds normal. No respiratory distress. He has no wheezes. He has no rales. He exhibits no tenderness.  Abdominal: Soft. Bowel sounds are normal. He exhibits no distension and no mass. There is no tenderness. There is no rebound and no guarding.  Musculoskeletal: Normal range of motion. He exhibits no edema.  Neurological: He is alert and oriented to person, place, and time.  Skin: Skin is warm. No rash noted. No erythema.  Has honeycombing dressing   Psychiatric: Affect and judgment normal.      LABORATORY PANEL:   CBC  Recent Labs Lab 10/20/16 0656  WBC 6.8  HGB 7.9*  HCT 23.9*  PLT 484*   ------------------------------------------------------------------------------------------------------------------  Chemistries   Recent Labs Lab 10/19/16 0318  NA 138  K 4.7  CL 101  CO2 32  GLUCOSE 100*  BUN 20  CREATININE 0.86  CALCIUM 8.2*   ------------------------------------------------------------------------------------------------------------------  Cardiac Enzymes No results for input(s): TROPONINI in the last 168 hours. ------------------------------------------------------------------------------------------------------------------  RADIOLOGY:  No results found.   ASSESSMENT AND PLAN:    82 year old Kurt with a history of hypothyroidism who presented after mechanical fall with failed patellar fixation and left heel ulcer  1. Failed patellar fixation with left knee wound dehiscence and hardware infection: Patient is postoperative day #5 status post ORIF left patella. Vancomycin changed to by DOXY for 10 days as per sensitivities from September 2017. Patient can follow-up with orthopedic surgery in 10 days. Physical therapy is recommended skilled nursing facility at discharge  2. Left heel ulcer: Evaluated by podiatry. Cleanse left heel ulcer with soap and water and pat  gently dry.  Apply Santyl to  wound bed.  Cover with NS moist 2x2.  Secure with 4x4 gauze and kerlix/tape,  Change daily Continue with a heel protecting boot. Patient can follow up with Dr. Selina Cooley outpatient in 2 weeks.   3. COPD without exacerbation: Continue inhalers  4. Hypothyroid: Continue Synthroid  5. BPH: Continue Proscar  6. Asymptomatic hypotension: Continue midodrine  7. Acute on chronic anemia baseline hgb 10-12: Hgb 7.9 this am no need for blood transfusion today.     Management plans discussed with the patient and he is in agreement.  CODE STATUS: full  TOTAL TIME TAKING CARE OF THIS PATIENT: 27 minutes.     POSSIBLE D/C tomorrow, DEPENDING ON CLINICAL CONDITION.   Delita Chiquito M.D on 10/20/2016 at 10:46 AM  Between 7am to 6pm - Pager - [phone] After 6pm go to [email or web address] - password [email or web address] [facility]  Office  [phone]  CC: Primary care physician; Tracie Harrier, MD  Note: This dictation was prepared with Dragon dictation along with smaller phrase technology. Any transcriptional errors that result from this process are unintentional.

## 2016-10-20 NOTE — Progress Notes (Signed)
  Subjective: 6 Days Post-Op Procedure(s) (LRB): OPEN REDUCTION INTERNAL (ORIF) FIXATION PATELLA (Left) Patient reports pain as moderate.  Patient has no complaints this am.  Denies any CP, SOB, ABD pain. We will continue therapy today.  Plan is to go Skilled nursing facility after hospital stay.  Objective: Vital signs in last 24 hours: Temp:  [97.6 F (36.4 C)-99.4 F (37.4 C)] 97.8 F (36.6 C) (11/12 0718) Pulse Rate:  [63-83] 63 (11/12 0718) Resp:  [16-18] 18 (11/12 0718) BP: (89-109)/(55-59) 109/59 (11/12 0718) SpO2:  [97 %-100 %] 97 % (11/12 0718) Weight:  [73.1 kg (161 lb 3.8 oz)] 73.1 kg (161 lb 3.8 oz) (11/12 0500)  Intake/Output from previous day: 11/11 0701 - 11/12 0700 In: -  Out: 975 [Urine:975] Intake/Output this shift: No intake/output data recorded.   Recent Labs  10/19/16 0318 10/20/16 0656  HGB 7.1* 7.9*    Recent Labs  10/19/16 0318 10/20/16 0656  WBC 6.7 6.8  RBC 2.39* 2.67*  HCT 21.7* 23.9*  PLT 382 484*    Recent Labs  10/19/16 0318  NA 138  K 4.7  CL 101  CO2 32  BUN 20  CREATININE 0.86  GLUCOSE 100*  CALCIUM 8.2*   No results for input(s): LABPT, INR in the last 72 hours.  EXAM General - Patient is Alert, Appropriate and Oriented Extremity - Neurovascular intact Sensation intact distally Intact pulses distally Dorsiflexion/Plantar flexion intact No cellulitis present Compartment soft  Left heel ulcer with intact dressing. Dressing - dressing C/D/I. Scant drainage Motor Function - intact, moving foot and toes well on exam.   Past Medical History:  Diagnosis Date  . Anemia   . Asthma   . BPH (benign prostatic hyperplasia)   . COPD (chronic obstructive pulmonary disease) (HCC)    not on home oxygen  . Coronary artery disease   . Hypotension   . Hypothyroidism   . Skin abnormalities   . Skin cancer     Assessment/Plan:   6 Days Post-Op Procedure(s) (LRB): OPEN REDUCTION INTERNAL (ORIF) FIXATION PATELLA  (Left) Active Problems:   Cellulitis   Pressure injury of skin  Estimated body mass index is 23.13 kg/m as calculated from the following:   Height as of this encounter: 5\' 10"  (1.778 m).   Weight as of this encounter: 73.1 kg (161 lb 3.8 oz). Advance diet Up with therapy, WBAT LLE. PT to avoid ALL KNEE RANGE OF MOTION.  Ok to discharge to SNF today pending medical clearance. Upon discharge to SNF, d/c IV abx and start oral abx, Doxycycline x 10 days. Follow up with [place] [facility] ortho in 10 days for staple removal.       DVT Prophylaxis - Lovenox, Foot Pumps and TED hose Weight-Bearing as tolerated to left leg   T. Rachelle Hora, PA-C [place] 10/20/2016, 7:43 AM

## 2016-10-20 NOTE — Care Management Important Message (Signed)
Important Message  Patient Details  Name: Kurt Hill MRN: [id number] Date of Birth: Mar 09, 1932   Medicare Important Message Given:  Yes    Tanina Barb A, RN 10/20/2016, 2:50 PM

## 2016-10-21 LAB — CBC
HEMATOCRIT: 24.3 % — AB (ref 40.0–52.0)
Hemoglobin: 7.8 g/dL — ABNORMAL LOW (ref 13.0–18.0)
MCH: 28.8 pg (ref 26.0–34.0)
MCHC: 32 g/dL (ref 32.0–36.0)
MCV: 90.1 fL (ref 80.0–100.0)
Platelets: 500 10*3/uL — ABNORMAL HIGH (ref 150–440)
RBC: 2.69 MIL/uL — AB (ref 4.40–5.90)
RDW: 15.5 % — ABNORMAL HIGH (ref 11.5–14.5)
WBC: 5.8 10*3/uL (ref 3.8–10.6)

## 2016-10-21 LAB — CREATININE, SERUM
Creatinine, Ser: 0.86 mg/dL (ref 0.61–1.24)
GFR calc Af Amer: >60 mL/min (ref >60)
GFR calc non Af Amer: >60 mL/min (ref >60)

## 2016-10-21 MED ORDER — SENNOSIDES-DOCUSATE SODIUM 8.6-50 MG PO TABS: 1.0000 | ORAL_TABLET | Freq: Two times a day (BID) | ORAL | Status: DC

## 2016-10-21 MED ORDER — BISACODYL 10 MG RE SUPP
10.0000 mg | Freq: Once | RECTAL | Status: AC
  Administered 2016-10-21: 10 mg via RECTAL
  Filled 2016-10-21: qty 1

## 2016-10-21 NOTE — Progress Notes (Signed)
Physical Therapy Treatment Patient Details Name: Kurt Hill MRN: [id number] DOB: 06/08/1932 Today's Date: 10/21/2016    History of Present Illness Pt is a 82 y/o M who presented to the ED after a mechanical fall.  He has had two previous revisions of his ORIF L patella due to recurrent infection.  Pt was found to have dehiscence of his surgical incision and a severe laceration to his L ear.  Due to his fall he lost fixation of L patella and is now s/p ORIF L patella (11/6).  Additionally, pt has a sore on his posterior L heel.  Pt's PMH includes COPD, hypothyroidism, skin cancer, ORIF L patella (07/30/16 and 08/15/16).    PT Comments    Pt in chair requesting to try ambulation and return to bed.  Pt less fearful this session and more cooperative. Stood with min assist to rolling walker.  Once he felt steady, he was able to ambulate to door and back in room with slow and steady gait.  Several standing rest breaks taken due to UE fatigue.  Returned to supine with min a for [employer].  Discussed with RN and KI removed once in supine.  Positioned with heels in heel protectors and reviewed no ROM restrictions with pt.     Follow Up Recommendations  SNF     Equipment Recommendations       Recommendations for Other Services       Precautions / Restrictions Precautions Precautions: Fall Precaution Comments: No PROM or AROM of the knee. Spoke with Podiatrist Dr. Elvina Mattes prior to evaluation who confirmed pt may be WBAT LLE as heel ulcer is posterior. Required Braces or Orthoses: Knee Immobilizer - Left Knee Immobilizer - Left: Other (comment) (Used due to ROM restrictions ) Restrictions Weight Bearing Restrictions: Yes LLE Weight Bearing: Weight bearing as tolerated    Mobility  Bed Mobility Overal bed mobility: Needs Assistance Bed Mobility: Sit to Supine     Supine to sit: Min guard Sit to supine: Min assist   General bed mobility comments: for le  management  Transfers Overall transfer level: Needs assistance Equipment used: Rolling walker (2 wheeled) Transfers: Sit to/from Stand Sit to Stand: Min assist Stand pivot transfers: Min assist       General transfer comment: assist for balance, remains fearful of falling  Ambulation/Gait Ambulation/Gait assistance: Min guard Ambulation Distance (Feet): 40 Feet (to door and back in room) Assistive device: Rolling walker (2 wheeled) Gait Pattern/deviations: Step-to pattern Gait velocity: decreased Gait velocity interpretation: <1.8 ft/sec, indicative of risk for recurrent falls General Gait Details: less fearful this session, ue fatigue limits distances   Stairs            Wheelchair Mobility    Modified Rankin (Stroke Patients Only)       Balance Overall balance assessment: Needs assistance Sitting-balance support: Feet supported Sitting balance-Leahy Scale: Good     Standing balance support: Bilateral upper extremity supported Standing balance-Leahy Scale: Poor Standing balance comment: relies on walker, fearful                    Cognition Arousal/Alertness: Awake/alert Behavior During Therapy: WFL for tasks assessed/performed Overall Cognitive Status: Within Functional Limits for tasks assessed                      Exercises      General Comments        Pertinent Vitals/Pain Pain Assessment: 0-10 Pain Score: 2  Pain Location: L knee Pain Descriptors / Indicators: Guarding Pain Intervention(s): Limited activity within patient's tolerance    Home Living                      Prior Function            PT Goals (current goals can now be found in the care plan section) Progress towards PT goals: Progressing toward goals    Frequency    BID      PT Plan Current plan remains appropriate    Co-evaluation             End of Session Equipment Utilized During Treatment: Gait belt Activity Tolerance: Patient  tolerated treatment well;Patient limited by fatigue Patient left: in bed;with call bell/phone within reach;with bed alarm set     Time: [id number] PT Time Calculation (min) (ACUTE ONLY): 24 min  Charges:  $Gait Training: 8-22 mins $Therapeutic Activity: 8-22 mins                    G Codes:      Chesley Noon, PTA 10/21/16, 11:11 AM

## 2016-10-21 NOTE — Progress Notes (Signed)
Physical Therapy Treatment Patient Details Name: Kurt Hill MRN: [id number] DOB: 1932-02-08 Today's Date: 10/21/2016    History of Present Illness Pt is a 82 y/o M who presented to the ED after a mechanical fall.  He has had two previous revisions of his ORIF L patella due to recurrent infection.  Pt was found to have dehiscence of his surgical incision and a severe laceration to his L ear.  Due to his fall he lost fixation of L patella and is now s/p ORIF L patella (11/6).  Additionally, pt has a sore on his posterior L heel.  Pt's PMH includes COPD, hypothyroidism, skin cancer, ORIF L patella (07/30/16 and 08/15/16).    PT Comments    Pt in bed resistant to therapy.  Pt voiced concern over hurting his knee.  Explained to pt that he was able to get oob and ambulate but no ROM in knee.  Pt continued with concerns and at one point stated "Fine, but if I hurt it I will be calling a lawyer"  Donned KI for oob/gait out of precaution and to make pt feel more confident with mobility skills. Per previous PT not KI is not necessary to wear per MD. He was able to transition to edge of bed wit min guard and use of rail.  He was able to stand with min assist but remained with anxiety and a fear of falling.  He transferred to recliner at bedside.  Remained up in chair.   Follow Up Recommendations  SNF     Equipment Recommendations       Recommendations for Other Services       Precautions / Restrictions Precautions Precautions: Fall Precaution Comments: No PROM or AROM of the knee. Spoke with Podiatrist Dr. Elvina Mattes prior to evaluation who confirmed pt may be WBAT LLE as heel ulcer is posterior. Required Braces or Orthoses: Knee Immobilizer - Left Restrictions Weight Bearing Restrictions: Yes LLE Weight Bearing: Weight bearing as tolerated    Mobility  Bed Mobility Overal bed mobility: Needs Assistance Bed Mobility: Supine to Sit     Supine to sit: Min guard        Transfers Overall  transfer level: Needs assistance Equipment used: Rolling walker (2 wheeled) Transfers: Sit to/from Stand Sit to Stand: Min assist Stand pivot transfers: Min assist       General transfer comment: assist for balance and encouragement.  anxious.  Ambulation/Gait Ambulation/Gait assistance: Min guard Ambulation Distance (Feet): 5 Feet Assistive device: Rolling walker (2 wheeled) Gait Pattern/deviations: Step-to pattern Gait velocity: decreased Gait velocity interpretation: <1.8 ft/sec, indicative of risk for recurrent falls General Gait Details: limited by anxiety and fear.   Stairs            Wheelchair Mobility    Modified Rankin (Stroke Patients Only)       Balance Overall balance assessment: Needs assistance Sitting-balance support: Feet supported Sitting balance-Leahy Scale: Good     Standing balance support: Bilateral upper extremity supported Standing balance-Leahy Scale: Poor Standing balance comment: relies on walker, fearful of falling                    Cognition Arousal/Alertness: Awake/alert Behavior During Therapy: WFL for tasks assessed/performed Overall Cognitive Status: Within Functional Limits for tasks assessed                      Exercises      General Comments  Pertinent Vitals/Pain Pain Assessment: 0-10 Pain Score: 4  Pain Location: L knee Pain Descriptors / Indicators: Guarding Pain Intervention(s): Limited activity within patient's tolerance    Home Living                      Prior Function            PT Goals (current goals can now be found in the care plan section) Progress towards PT goals: Progressing toward goals    Frequency    BID      PT Plan Current plan remains appropriate    Co-evaluation             End of Session Equipment Utilized During Treatment: Gait belt Activity Tolerance: Patient limited by fatigue;Treatment limited secondary to agitation Patient left:  in chair;with call bell/phone within reach;with chair alarm set     Time: [id number] PT Time Calculation (min) (ACUTE ONLY): 25 min  Charges:  $Gait Training: 8-22 mins $Therapeutic Activity: 8-22 mins                    G Codes:      Chesley Noon, PTA 10/21/16, 11:02 AM

## 2016-10-21 NOTE — Progress Notes (Signed)
Clinical Social Worker (CSW) met with patient this morning to discuss D/C plan. Patient reported that he has changed his mind and is willing to try rehab. CSW provided bed offers. Patient chose [facility]. Per Ortho PA he will place a provena wound vac on patient late this afternoon. Per PA the wound vac will stay on for 7 days and patient will get it taken off at the Ortho follow up appt. Per MD patient will D/C tomorrow. [facility] admissions coordinator at [facility] is aware of above. CSW will continue to follow and assist as needed.   [employer], LCSW [phone]

## 2016-10-21 NOTE — Consult Note (Signed)
[place] Nurse wound consult note Reason for Consult:Asked to consult to apply Prevena incisional VAC to left knee.  Discharging today.  Wound type:Chronic nonhealing surgical wound.  Had ORIF left patella this hospitalization.  Pressure Ulcer POA: Yes  unstageable to left heel.  Measurement:Left knee:  Staple line, some dehiscence beneath staples at the bend of the knee. Staples remain intact.  Incisional VAC applied.  Wound [id number] line with some dehiscence at bend of the knee.  Drainage (amount, consistency, odor) Minimal serosanguinous Periwound: Edema, erythema.  No purulence, warmth or increased pain.  Dressing procedure/placement/frequency:Cleanse wound to left knee with NS and pat gently dry.  Apply incisional VAC sponge dressing, sealing with Hydrocolloids included in the packet.  Cover with drape.  Seal achieved at 125.  Will leave in place for 7 days.  Will not follow at this time.  Please re-consult if needed.  Domenic Moras RN BSN [place] Pager [phone]

## 2016-10-21 NOTE — Progress Notes (Signed)
Patient is medically stable for D/C to [facility] today. Patient's provenia wound vac was placed today. Per [facility] admissions coordinator at [facility] patient will go to room 410. RN will call report to 400 hall RN and arrange EMS for transport. Clinical Education officer, museum (CSW) sent D/C orders to [employer] via [place]. Patient is aware of above. CSW left patient's son [name] a Advertising account executive. Please reconsult if future social work needs arise. CSW signing off.   [employer], LCSW [phone]

## 2016-10-21 NOTE — Progress Notes (Signed)
Kurt Hill at [place] NAME: Kurt Hill    MR#:  [id number]  DATE OF BIRTH:  08/07/1932  SUBJECTIVE:   Patient doing well this morning. He will need of wound VAC placed this afternoon due to opening of the wound of his left patella  REVIEW OF SYSTEMS:    Review of Systems  Constitutional: Negative.  Negative for chills, fever and malaise/fatigue.  HENT: Negative.  Negative for ear discharge, ear pain, hearing loss, nosebleeds and sore throat.   Eyes: Negative.  Negative for blurred vision and pain.  Respiratory: Negative.  Negative for cough, hemoptysis, shortness of breath and wheezing.   Cardiovascular: Negative.  Negative for chest pain, palpitations and leg swelling.  Gastrointestinal: Negative.  Negative for abdominal pain, blood in stool, diarrhea, nausea and vomiting.  Genitourinary: Negative.  Negative for dysuria.  Musculoskeletal: Negative.  Negative for back pain.  Skin: Negative.   Neurological: Negative for dizziness, tremors, speech change, focal weakness, seizures and headaches.  Endo/Heme/Allergies: Negative.  Does not bruise/bleed easily.  Psychiatric/Behavioral: Negative.  Negative for depression, hallucinations and suicidal ideas.    Tolerating Diet: Yes      DRUG ALLERGIES:   Allergies  Allergen Reactions  . Ivp Dye [Iodinated Diagnostic Agents] Itching and Rash  . Sulfa Antibiotics Rash    VITALS:  Blood pressure (!) 99/57, pulse 76, temperature 98.7 F (37.1 C), temperature source Oral, resp. rate 18, height 5\' 10"  (1.778 m), weight 73.1 kg (161 lb 3.8 oz), SpO2 99 %.  PHYSICAL EXAMINATION:   Physical Exam  Constitutional: He is oriented to person, place, and time and well-developed, well-nourished, and in no distress. No distress.  HENT:  Head: Normocephalic.  Eyes: No scleral icterus.  Neck: Normal range of motion. Neck supple. No JVD present. No tracheal deviation present.  Cardiovascular: Normal  rate, regular rhythm and normal heart sounds.  Exam reveals no gallop and no friction rub.   No murmur heard. Pulmonary/Chest: Effort normal and breath sounds normal. No respiratory distress. He has no wheezes. He has no rales. He exhibits no tenderness.  Abdominal: Soft. Bowel sounds are normal. He exhibits no distension and no mass. There is no tenderness. There is no rebound and no guarding.  Musculoskeletal: Normal range of motion. He exhibits no edema.  Honeycomb dressing placed on left knee  Neurological: He is alert and oriented to person, place, and time.  Skin: Skin is warm. No rash noted. No erythema.  Psychiatric: Affect and judgment normal.      LABORATORY PANEL:   CBC  Recent Labs Lab 10/21/16 0440  WBC 5.8  HGB 7.8*  HCT 24.3*  PLT 500*   ------------------------------------------------------------------------------------------------------------------  Chemistries   Recent Labs Lab 10/19/16 0318 10/21/16 0440  NA 138  --   K 4.7  --   CL 101  --   CO2 32  --   GLUCOSE 100*  --   BUN 20  --   CREATININE 0.86 0.86  CALCIUM 8.2*  --    ------------------------------------------------------------------------------------------------------------------  Cardiac Enzymes No results for input(s): TROPONINI in the last 168 hours. ------------------------------------------------------------------------------------------------------------------  RADIOLOGY:  No results found.   ASSESSMENT AND PLAN:   82 year old male with a history of hypothyroidism who presented after mechanical fall with failed patellar fixation and left heel ulcer  1. Failed patellar fixation with left knee wound dehiscence and hardware infection: Patient is status post ORIF left patella. The IV antibiotics were changed to by mouth DOXY for  10 days from 11/13 /2017 (as per sensitivities from September 2017). Due to open wound he has a wound vac [;aced that will need to be changed in 7  days.  Patient can follow-up with orthopedic surgery in a week. Physical therapy is recommended skilled nursing facility at discharge  2. Left heel ulcer: Evaluated by podiatry. Cleanse left heel ulcer with soap and water and pat gently dry. Apply Santyl to wound bed. Cover with NS moist 2x2. Secure with 4x4 gauze and kerlix/tape, Change daily Continue with a heel protecting boot. Patient can follow up with Dr. Selina Cooley outpatient in 2 weeks from discharge.   3. COPD without exacerbation: Continue inhalers  4. Hypothyroid: Continue Synthroid  5. BPH: Continue Proscar  6. Asymptomatic hypotension: Continue midodrine   7. 7. Acute on chronic anemia baseline hgb 10-12: Hgb 7.8 this am no need for blood transfusion today.      Management plans discussed with the patient and he is in agreement.  CODE STATUS: full  TOTAL TIME TAKING CARE OF THIS PATIENT: 29 minutes.     POSSIBLE D/C tomorrow, DEPENDING ON CLINICAL CONDITION.   Burl Tauzin M.D on 10/21/2016 at 10:36 AM  Between 7am to 6pm - Pager - [phone] After 6pm go to [email or web address] - password [email or web address] [place] [facility]  Office  [phone]  CC: Primary care physician; Tracie Harrier, MD  Note: This dictation was prepared with Dragon dictation along with smaller phrase technology. Any transcriptional errors that result from this process are unintentional.

## 2016-10-21 NOTE — Progress Notes (Addendum)
  Subjective: 7 Days Post-Op Procedure(s) (LRB): OPEN REDUCTION INTERNAL (ORIF) FIXATION PATELLA (Left) Patient reports pain as moderate.  Patient has no complaints this am.  Denies any CP, SOB, ABD pain. We will continue therapy today.  Plan is to go Skilled nursing facility after hospital stay.  Objective: Vital signs in last 24 hours: Temp:  [97.4 F (36.3 C)-98.7 F (37.1 C)] 98.7 F (37.1 C) (11/13 0422) Pulse Rate:  [65-76] 76 (11/13 0422) Resp:  [18] 18 (11/13 0422) BP: (99-124)/(57-59) 99/57 (11/13 0422) SpO2:  [97 %-99 %] 99 % (11/13 0422)  Intake/Output from previous day: 11/12 0701 - 11/13 0700 In: 480 [P.O.:480] Out: 900 [Urine:900] Intake/Output this shift: No intake/output data recorded.   Recent Labs  10/19/16 0318 10/20/16 0656 10/21/16 0440  HGB 7.1* 7.9* 7.8*    Recent Labs  10/20/16 0656 10/21/16 0440  WBC 6.8 5.8  RBC 2.67* 2.69*  HCT 23.9* 24.3*  PLT 484* 500*    Recent Labs  10/19/16 0318 10/21/16 0440  NA 138  --   K 4.7  --   CL 101  --   CO2 32  --   BUN 20  --   CREATININE 0.86 0.86  GLUCOSE 100*  --   CALCIUM 8.2*  --    No results for input(s): LABPT, INR in the last 72 hours.  EXAM General - Patient is Alert, Appropriate and Oriented Extremity - Neurovascular intact Sensation intact distally Intact pulses distally Dorsiflexion/Plantar flexion intact No cellulitis present Compartment soft  Left heel ulcer with intact dressing. Dressing - dressing C/D/I. Moderate drainage. Motor Function - intact, moving foot and toes well on exam.   Past Medical History:  Diagnosis Date  . Anemia   . Asthma   . BPH (benign prostatic hyperplasia)   . COPD (chronic obstructive pulmonary disease) (HCC)    not on home oxygen  . Coronary artery disease   . Hypotension   . Hypothyroidism   . Skin abnormalities   . Skin cancer     Assessment/Plan:   7 Days Post-Op Procedure(s) (LRB): OPEN REDUCTION INTERNAL (ORIF) FIXATION  PATELLA (Left) Active Problems:   Cellulitis   Pressure injury of skin  Estimated body mass index is 23.13 kg/m as calculated from the following:   Height as of this encounter: 5\' 10"  (1.778 m).   Weight as of this encounter: 73.1 kg (161 lb 3.8 oz). Advance diet Up with therapy, WBAT LLE. PT to avoid ALL KNEE RANGE OF MOTION.  Ok to discharge to SNF today pending medical clearance. We'll place provena plus wound VAC on left knee prior to discharge. Upon discharge to SNF, d/c IV abx and start oral abx, Doxycycline x 10 days. Follow up with [facility] ortho in 7 days for staple removal.       DVT Prophylaxis - Lovenox, Foot Pumps and TED hose Weight-Bearing as tolerated to left leg   T. Rachelle Hora, PA-C [place] 10/21/2016, 8:17 AM

## 2016-10-21 NOTE — Clinical Social Work Placement (Signed)
   CLINICAL SOCIAL WORK PLACEMENT  NOTE  Date:  10/21/2016  Patient Details  Name: Kurt Hill MRN: [id number] Date of Birth: [birth year]  Clinical Social Work is seeking post-discharge placement for this patient at the [place] level of care (*CSW will initial, date and re-position this form in  chart as items are completed):  Yes   Patient/family provided with [place] Work Department's list of facilities offering this level of care within the geographic area requested by the patient (or if unable, by the patient's family).  Yes   Patient/family informed of their freedom to choose among providers that offer the needed level of care, that participate in Medicare, Medicaid or managed care program needed by the patient, have an available bed and are willing to accept the patient.  Yes   Patient/family informed of [facility]'s ownership interest in [facility] and [facility], as well as of the fact that they are under no obligation to receive care at these facilities.  PASRR submitted to EDS on       PASRR number received on       Existing PASRR number confirmed on 10/21/16     FL2 transmitted to all facilities in geographic area requested by pt/family on 10/20/16     FL2 transmitted to all facilities within larger geographic area on       Patient informed that his/her managed care company has contracts with or will negotiate with certain facilities, including the following:        Yes   Patient/family informed of bed offers received.  Patient chooses bed at  [facility] )     Physician recommends and patient chooses bed at      Patient to be transferred to  [facility] ) on 10/21/16.  Patient to be transferred to facility by  [facility] )     Patient family notified on 10/21/16 of transfer.  Name of family member notified:   ([place] left patient's son [name] a Advertising account executive. )     PHYSICIAN       Additional  Comment:    _______________________________________________ Chaniece Barbato, [name], LCSW 10/21/2016, 3:06 PM

## 2016-10-21 NOTE — Progress Notes (Addendum)
Pt A and O x 4. VSS. Pt tolerating Diet. No complaints of pain. IV removed intact. Report called to [facility]. Pt left via EMS with wound vac and immobilizer in place. Pt was given AVS, and prescriptions sent with EMS. Per MD stitches in ear are to be removed in follow-up appointment.

## 2016-11-28 ENCOUNTER — Emergency Department
Admission: EM | Admit: 2016-11-28 | Discharge: 2016-11-28 | Disposition: A | Discharge status: Home / Self Care | Payer: Medicare Other | Attending: Emergency Medicine | Admitting: Emergency Medicine

## 2016-11-28 ENCOUNTER — Emergency Department: Payer: Medicare Other

## 2016-11-28 DIAGNOSIS — Z7982 Long term (current) use of aspirin: Secondary | ICD-10-CM | POA: Diagnosis not present

## 2016-11-28 DIAGNOSIS — S91302A Unspecified open wound, left foot, initial encounter: Secondary | ICD-10-CM

## 2016-11-28 DIAGNOSIS — E039 Hypothyroidism, unspecified: Secondary | ICD-10-CM | POA: Insufficient documentation

## 2016-11-28 DIAGNOSIS — Z79899 Other long term (current) drug therapy: Secondary | ICD-10-CM | POA: Insufficient documentation

## 2016-11-28 DIAGNOSIS — J441 Chronic obstructive pulmonary disease with (acute) exacerbation: Secondary | ICD-10-CM | POA: Insufficient documentation

## 2016-11-28 DIAGNOSIS — I959 Hypotension, unspecified: Secondary | ICD-10-CM | POA: Insufficient documentation

## 2016-11-28 DIAGNOSIS — L97429 Non-pressure chronic ulcer of left heel and midfoot with unspecified severity: Secondary | ICD-10-CM | POA: Diagnosis not present

## 2016-11-28 DIAGNOSIS — J45909 Unspecified asthma, uncomplicated: Secondary | ICD-10-CM | POA: Insufficient documentation

## 2016-11-28 DIAGNOSIS — R55 Syncope and collapse: Secondary | ICD-10-CM

## 2016-11-28 DIAGNOSIS — I2510 Atherosclerotic heart disease of native coronary artery without angina pectoris: Secondary | ICD-10-CM | POA: Diagnosis not present

## 2016-11-28 LAB — COMPREHENSIVE METABOLIC PANEL
ALBUMIN: 2.4 g/dL — AB (ref 3.5–5.0)
ALK PHOS: 86 U/L (ref 38–126)
ALT: 16 U/L — ABNORMAL LOW (ref 17–63)
ANION GAP: 5 (ref 5–15)
AST: 26 U/L (ref 15–41)
BILIRUBIN TOTAL: 0.2 mg/dL — AB (ref 0.3–1.2)
BUN: 26 mg/dL — AB (ref 6–20)
CALCIUM: 8.9 mg/dL (ref 8.9–10.3)
CO2: 31 mmol/L (ref 22–32)
Chloride: 98 mmol/L — ABNORMAL LOW (ref 101–111)
Creatinine, Ser: 1 mg/dL (ref 0.61–1.24)
GFR calc Af Amer: >60 mL/min (ref >60)
GLUCOSE: 95 mg/dL (ref 65–99)
POTASSIUM: 4.5 mmol/L (ref 3.5–5.1)
Sodium: 134 mmol/L — ABNORMAL LOW (ref 135–145)
TOTAL PROTEIN: 6.1 g/dL — AB (ref 6.5–8.1)

## 2016-11-28 LAB — CBC WITH DIFFERENTIAL/PLATELET
BASOS PCT: 1 %
Basophils Absolute: 0.1 10*3/uL (ref 0–0.1)
Eosinophils Absolute: 0.8 10*3/uL — ABNORMAL HIGH (ref 0–0.7)
Eosinophils Relative: 10 %
HEMATOCRIT: 28.7 % — AB (ref 40.0–52.0)
HEMOGLOBIN: 9.6 g/dL — AB (ref 13.0–18.0)
LYMPHS PCT: 12 %
Lymphs Abs: 1 10*3/uL (ref 1.0–3.6)
MCH: 28.1 pg (ref 26.0–34.0)
MCHC: 33.3 g/dL (ref 32.0–36.0)
MCV: 84.5 fL (ref 80.0–100.0)
MONO ABS: 0.9 10*3/uL (ref 0.2–1.0)
MONOS PCT: 11 %
NEUTROS ABS: 5.5 10*3/uL (ref 1.4–6.5)
NEUTROS PCT: 66 %
Platelets: 376 10*3/uL (ref 150–440)
RBC: 3.4 MIL/uL — ABNORMAL LOW (ref 4.40–5.90)
RDW: 17 % — AB (ref 11.5–14.5)
WBC: 8.2 10*3/uL (ref 3.8–10.6)

## 2016-11-28 LAB — URINALYSIS, COMPLETE (UACMP) WITH MICROSCOPIC
Bacteria, UA: NONE SEEN
Bilirubin Urine: NEGATIVE
Glucose, UA: NEGATIVE mg/dL
Hgb urine dipstick: NEGATIVE
Ketones, ur: 5 mg/dL — AB
Leukocytes, UA: NEGATIVE
Nitrite: NEGATIVE
PH: 5 (ref 5.0–8.0)
Protein, ur: NEGATIVE mg/dL
SPECIFIC GRAVITY, URINE: 1.026 (ref 1.005–1.030)

## 2016-11-28 LAB — TROPONIN I

## 2016-11-28 NOTE — ED Notes (Signed)
Attempted to call [facility], no answer at either extension.

## 2016-11-28 NOTE — ED Triage Notes (Signed)
Pt bib EMS from [facility] w/ c/o fluctuating BP.  Per EMS, facility stated that systolic BP was btw [id number].  Pt alert and oriented.  Pt recently had L knee surgery and would to L ankle.  Pt sts that he has had no appetite, +PO fluids.

## 2016-11-28 NOTE — ED Notes (Signed)
Attempted to walk pt.  Pt sts that he has been unable to walk x 2 weeks.  Pt has open sore to L heel, sts painful to walk on.

## 2016-11-28 NOTE — ED Provider Notes (Signed)
Patient received in sign-out from Dr. Dineen Kid.  Workup and evaluation pending follow-up chest x-ray and continue monitoring.  Patient presenting with report of hypotension and concern for septic picture however the patient is remained afebrile and hemodynamically stable during his entire stay here in the ER. Blood work is otherwise reassuring. Does have multiple pressure ulcers but none appear acutely infected. Chest x-ray shows what appears to be a chronic interstitial prominence. He is no acute hypoxia to suggest any evidence of pulmonary edema. We'll provide additional wound care for particularly his left ankle. Do not feel he needs systemic antibiotics at this time. Patient otherwise appears stable for continued outpatient follow-up with podiatry.  Have discussed with the patient and available family all diagnostics and treatments performed thus far and all questions were answered to the best of my ability. The patient demonstrates understanding and agreement with plan.       Merlyn Lot, MD 11/28/16 [phone]

## 2016-11-28 NOTE — ED Notes (Signed)
Attempted to all report to [facility], no answer at either extension

## 2016-11-28 NOTE — ED Notes (Signed)
Spoke w/ [employer], gave report.

## 2016-11-28 NOTE — ED Provider Notes (Addendum)
[facility] Emergency Department Provider Note  ____________________________________________   First MD Initiated Contact with Patient 11/28/16 1754     (approximate)  I have reviewed the triage vital signs and the nursing notes.   HISTORY  Chief Complaint Hypotension   HPI Kurt Hill is a 82 y.o. male with a history of COPD as well as hypertension is present to the emergency department today with hypotension at his skilled nursing facility. The patient says that while walking he felt lightheaded, weak.  EMS reports that the patienthad systolic pressures between 30 and 110 at his care facility. The patient is denying any pain. The facility was concerned for sepsis with a likely source of the patient's left heel ulcer.   Past Medical History:  Diagnosis Date  . Anemia   . Asthma   . BPH (benign prostatic hyperplasia)   . COPD (chronic obstructive pulmonary disease) (HCC)    not on home oxygen  . Coronary artery disease   . Hypotension   . Hypothyroidism   . Skin abnormalities   . Skin cancer     Patient Active Problem List   Diagnosis Date Noted  . Pressure injury of skin 10/13/2016  . Cellulitis of left leg 08/30/2016  . Post op infection 08/30/2016  . Cellulitis 08/30/2016  . Left patella fracture 08/15/2016  . Patella fracture 07/30/2016  . COPD exacerbation ([place]) 12/14/2015    Past Surgical History:  Procedure Laterality Date  . APPENDECTOMY    . BONE RESECTION     on nose  . MOHS SURGERY    . ORIF PATELLA Left 07/30/2016   Procedure: OPEN REDUCTION INTERNAL (ORIF) FIXATION PATELLA;  Surgeon: Hessie Knows, MD;  Location: [facility] ORS;  Service: Orthopedics;  Laterality: Left;  . ORIF PATELLA Left 08/15/2016   Procedure: OPEN REDUCTION INTERNAL (ORIF) FIXATION PATELLA;  Surgeon: Hessie Knows, MD;  Location: [facility] ORS;  Service: Orthopedics;  Laterality: Left;  . ORIF PATELLA Left 10/14/2016   Procedure: OPEN REDUCTION INTERNAL (ORIF)  FIXATION PATELLA;  Surgeon: Hessie Knows, MD;  Location: [facility] ORS;  Service: Orthopedics;  Laterality: Left;  . TONSILLECTOMY      Prior to Admission medications   Medication Sig Start Date End Date Taking? Authorizing Provider  albuterol (PROAIR HFA) 108 (90 Base) MCG/ACT inhaler Inhale 2 puffs into the lungs every 6 (six) hours as needed for wheezing or shortness of breath.  12/06/15 12/05/16  Historical Provider, MD  albuterol (PROVENTIL HFA;VENTOLIN HFA) 108 (90 Base) MCG/ACT inhaler Inhale 2 puffs into the lungs 4 (four) times daily as needed for wheezing. 06/20/16 06/20/17  Historical Provider, MD  albuterol (PROVENTIL) (2.5 MG/3ML) 0.083% nebulizer solution Take 2.5 mg by nebulization every 6 (six) hours as needed for wheezing or shortness of breath.    Historical Provider, MD  alum & mag hydroxide-simeth (MYLANTA) 200-200-20 MG/5ML suspension Take 30 mLs by mouth every 6 (six) hours as needed for indigestion or heartburn.    Historical Provider, MD  aspirin EC 81 MG tablet Take 81 mg by mouth every morning.    Historical Provider, MD  budesonide-formoterol (SYMBICORT) 80-4.5 MCG/ACT inhaler Inhale 2 puffs into the lungs 2 (two) times daily.    Historical Provider, MD  collagenase (SANTYL) ointment Apply topically daily. 10/19/16   Bettey Costa, MD  feeding supplement, ENSURE ENLIVE, (ENSURE ENLIVE) LIQD Take 237 mLs by mouth 2 (two) times daily between meals. 10/19/16   Bettey Costa, MD  finasteride (PROSCAR) 5 MG tablet Take 5 mg  by mouth every morning. 03/12/16 03/12/17  Historical Provider, MD  lactulose (CHRONULAC) 10 GM/15ML solution Take 45 mLs (30 g total) by mouth daily as needed for moderate constipation. 09/17/16   Paulette Blanch, MD  levothyroxine (SYNTHROID, LEVOTHROID) 100 MCG tablet Take 1 tablet by mouth daily. 09/04/15   Historical Provider, MD  midodrine (PROAMATINE) 5 MG tablet Take 1 tablet (5 mg total) by mouth 3 (three) times daily with meals. 10/19/16   Bettey Costa, MD  mupirocin  ointment (BACTROBAN) 2 % Place 1 application into the nose 2 (two) times daily. 09/02/16   Duanne Guess, PA-C  oxyCODONE (OXY IR/ROXICODONE) 5 MG immediate release tablet Take 1 tablet (5 mg total) by mouth every 4 (four) hours as needed for moderate pain or severe pain. 10/19/16   Bettey Costa, MD  polyethylene glycol (MIRALAX / GLYCOLAX) packet Take 17 g by mouth daily. 10/20/16   Bettey Costa, MD  Vitamin D, Ergocalciferol, (DRISDOL) 50000 units CAPS capsule Take 1 capsule by mouth every 7 (seven) days. 09/04/15   Historical Provider, MD    Allergies Ivp dye [iodinated diagnostic agents] and Sulfa antibiotics  No family history on file.  Social History Social History  Substance Use Topics  . Smoking status: Never Smoker  . Smokeless tobacco: Never Used     Comment: tried cigarettes when young- but never really smoked  . Alcohol use No    Review of Systems Constitutional: No fever/chills Eyes: No visual changes. ENT: No sore throat. Cardiovascular: Denies chest pain. Respiratory: Denies shortness of breath. Gastrointestinal: No abdominal pain.  No nausea, no vomiting.  No diarrhea.  No constipation. Genitourinary: Negative for dysuria. Musculoskeletal: Negative for back pain. Skin: Negative for rash. Neurological: Negative for headaches, focal weakness or numbness.  10-point ROS otherwise negative.  ____________________________________________   PHYSICAL EXAM:  VITAL SIGNS: ED Triage Vitals  Enc Vitals Group     BP 11/28/16 1752 (!) 108/51     Pulse Rate 11/28/16 1752 75     Resp 11/28/16 1752 20     Temp 11/28/16 1752 97.7 F (36.5 C)     Temp Source 11/28/16 1752 Oral     SpO2 11/28/16 1752 96 %     Weight 11/28/16 1753 161 lb (73 kg)     Height --      Head Circumference --      Peak Flow --      Pain Score 11/28/16 1753 0     Pain Loc --      Pain Edu? --      Excl. in [place]? --     Constitutional: Alert and oriented. Well appearing and in no acute  distress. Eyes: Conjunctivae are normal. PERRL. EOMI.  Patient with known conjunctivitis with exudate bilaterally. Exudate is crusted on the lower eyelids. No fluid actively flowing from the bilateral eyes. Head: Atraumatic. Nose: No congestion/rhinnorhea. Mouth/Throat: Mucous membranes are moist.   Neck: No stridor.   Cardiovascular: Normal rate, regular rhythm. Grossly normal heart sounds.  Good peripheral circulation. Respiratory: Normal respiratory effort.  No retractions. Lungs CTAB. Gastrointestinal: Soft and nontender. No distention.  Musculoskeletal: No lower extremity tenderness nor edema.  No joint effusions.  Patient and left lower extremity knee immobilizer. Left knee with protruding wire which is covered with a bandage. This is chronic. The patient says that his orthopedist as recommended a revision. Neurologic:  Normal speech and language. No gross focal neurologic deficits are appreciated. No gait instability. Skin:  Left heel  with ulceration that is posterior and circular and about 4 cm in diameter. Exposing underlying adipose without exposed bone.  No surrounding erythema or induration. No tenderness to palpation. No pus. Psychiatric: Mood and affect are normal. Speech and behavior are normal.  ____________________________________________   LABS (all labs ordered are listed, but only abnormal results are displayed)  Labs Reviewed  CBC WITH DIFFERENTIAL/PLATELET - Abnormal; Notable for the following:       Result Value   RBC 3.40 (*)    Hemoglobin 9.6 (*)    HCT 28.7 (*)    RDW 17.0 (*)    Eosinophils Absolute 0.8 (*)    All other components within normal limits  COMPREHENSIVE METABOLIC PANEL - Abnormal; Notable for the following:    Sodium 134 (*)    Chloride 98 (*)    BUN 26 (*)    Total Protein 6.1 (*)    Albumin 2.4 (*)    ALT 16 (*)    Total Bilirubin 0.2 (*)    All other components within normal limits  URINALYSIS, COMPLETE (UACMP) WITH MICROSCOPIC -  Abnormal; Notable for the following:    Color, Urine AMBER (*)    APPearance CLEAR (*)    Ketones, ur 5 (*)    Squamous Epithelial / LPF 0-5 (*)    All other components within normal limits  TROPONIN I   ____________________________________________  EKG  ED ECG REPORT I, Doran Stabler, the attending physician, personally viewed and interpreted this ECG.   Date: 11/28/2016  EKG Time: 1755  Rate: 72  Rhythm: Junctional rhythm versus sinus rhythm.  Similar rhythms seen on the EKG of 12/16/2015.  Axis: Normal axis  Intervals:none  ST&T Change: No ST segment elevation or depression. No abnormal T-wave inversion.  ____________________________________________  RADIOLOGY DG Chest 1 View (Accession [id number]) (Order [id number])  Imaging  Date: 11/28/2016 Department: [facility] EMERGENCY DEPARTMENT Released By/Authorizing: Orbie Pyo, MD (auto-released)  Exam Information   Status Exam Begun  Exam Ended   Final [99] 11/28/2016 6:27 PM 11/28/2016 6:29 PM  PACS Images   Show images for DG Chest 1 View  Study Result   CLINICAL DATA:  Hypotension.  EXAM: CHEST 1 VIEW  COMPARISON:  07/30/2016  FINDINGS: 1818 hours. The cardio pericardial silhouette is enlarged. There is pulmonary vascular congestion without overt pulmonary edema. Appearance of air-fluid level at the left base is probably artifact. Interstitial markings are diffusely coarsened with chronic features. Bones are diffusely demineralized. Telemetry leads overlie the chest.  IMPRESSION: Cardiomegaly with chronic underlying interstitial changes.  Probable artifact at the left base although air-fluid level could have this appearance. Dedicated PA and lateral chest x-ray, when patient is able, recommended to further evaluate.   Electronically Signed   By: Misty Stanley M.D.   On: 11/28/2016 18:41    DG Foot Complete Left (Accession [id number]) (Order  [id number])  Imaging  Date: 11/28/2016 Department: [facility] EMERGENCY DEPARTMENT Released By/Authorizing: Orbie Pyo, MD (auto-released)  Exam Information   Status Exam Begun  Exam Ended   Final [99] 11/28/2016 6:27 PM 11/28/2016 6:29 PM  PACS Images   Show images for DG Foot Complete Left  Study Result   CLINICAL DATA:  Nonhealing left heel wound.  EXAM: LEFT FOOT - COMPLETE 3+ VIEW  COMPARISON:  None.  FINDINGS: After recent knee surgery there is difficulty with positioning the foot and the dorsal aspect of the heel is obscured by collimator. Per technologist the  patient's wound is on the plantar surface of the heel, which is visualized. There is no opaque foreign body, soft tissue gas, or evidence of osteomyelitis. No fracture deformity or subluxation. Hammertoe deformities. Osteopenia.  IMPRESSION: No evidence of osteomyelitis or foreign body. Please see description above.   Electronically Signed   By: Monte Fantasia M.D.   On: 11/28/2016 18:44     ____________________________________________   PROCEDURES  Procedure(s) performed:   Procedures  Critical Care performed:   ____________________________________________   INITIAL IMPRESSION / ASSESSMENT AND PLAN / ED COURSE  Pertinent labs & imaging results that were available during my care of the patient were reviewed by me and considered in my medical decision making (see chart for details).    Clinical Course   ----------------------------------------- 930 PM on 11/28/2016 ----------------------------------------- Patient receiving IV fluids at this time. Pending repeat chest x-ray. Signed out to Dr. Quentin Cornwall. Unlikely to be sepsis. Patient says that he has not been eating well although he admits to drinking fluids. Patient's pressure when last checked was 0000000 systolic. Patient will also be walked prior to discharge to make sure that he does not have  orthostatic hypotension.  Patient without obvious infectious source. Reassuring lab work.    ____________________________________________   FINAL CLINICAL IMPRESSION(S) / ED DIAGNOSES  Hypotension. Near-syncope.    NEW MEDICATIONS STARTED DURING THIS VISIT:  New Prescriptions   No medications on file     Note:  This document was prepared using Dragon voice recognition software and may include unintentional dictation errors.    Orbie Pyo, MD 11/28/16 2214    Orbie Pyo, MD 11/28/16 2214

## 2016-12-03 ENCOUNTER — Emergency Department: Payer: Medicare Other

## 2016-12-03 ENCOUNTER — Encounter: Payer: Self-pay | Admitting: Emergency Medicine

## 2016-12-03 ENCOUNTER — Inpatient Hospital Stay
Admission: EM | Admit: 2016-12-03 | Discharge: 2016-12-07 | DRG: 539 | Disposition: A | Discharge status: Skilled Nursing Facility | Payer: Medicare Other | Attending: Internal Medicine | Admitting: Internal Medicine

## 2016-12-03 DIAGNOSIS — M869 Osteomyelitis, unspecified: Secondary | ICD-10-CM

## 2016-12-03 DIAGNOSIS — M868X7 Other osteomyelitis, ankle and foot: Principal | ICD-10-CM | POA: Diagnosis present

## 2016-12-03 DIAGNOSIS — K5900 Constipation, unspecified: Secondary | ICD-10-CM | POA: Diagnosis present

## 2016-12-03 DIAGNOSIS — E43 Unspecified severe protein-calorie malnutrition: Secondary | ICD-10-CM | POA: Insufficient documentation

## 2016-12-03 DIAGNOSIS — R338 Other retention of urine: Secondary | ICD-10-CM | POA: Diagnosis present

## 2016-12-03 DIAGNOSIS — M6281 Muscle weakness (generalized): Secondary | ICD-10-CM

## 2016-12-03 DIAGNOSIS — Z91041 Radiographic dye allergy status: Secondary | ICD-10-CM | POA: Diagnosis not present

## 2016-12-03 DIAGNOSIS — L0390 Cellulitis, unspecified: Secondary | ICD-10-CM | POA: Diagnosis present

## 2016-12-03 DIAGNOSIS — S81002A Unspecified open wound, left knee, initial encounter: Secondary | ICD-10-CM | POA: Diagnosis present

## 2016-12-03 DIAGNOSIS — Z85828 Personal history of other malignant neoplasm of skin: Secondary | ICD-10-CM | POA: Diagnosis not present

## 2016-12-03 DIAGNOSIS — N401 Enlarged prostate with lower urinary tract symptoms: Secondary | ICD-10-CM | POA: Diagnosis present

## 2016-12-03 DIAGNOSIS — L89623 Pressure ulcer of left heel, stage 3: Secondary | ICD-10-CM | POA: Diagnosis present

## 2016-12-03 DIAGNOSIS — Z8249 Family history of ischemic heart disease and other diseases of the circulatory system: Secondary | ICD-10-CM | POA: Diagnosis not present

## 2016-12-03 DIAGNOSIS — E039 Hypothyroidism, unspecified: Secondary | ICD-10-CM | POA: Diagnosis present

## 2016-12-03 DIAGNOSIS — R627 Adult failure to thrive: Secondary | ICD-10-CM | POA: Diagnosis present

## 2016-12-03 DIAGNOSIS — H109 Unspecified conjunctivitis: Secondary | ICD-10-CM | POA: Diagnosis present

## 2016-12-03 DIAGNOSIS — Z6820 Body mass index (BMI) 20.0-20.9, adult: Secondary | ICD-10-CM | POA: Diagnosis not present

## 2016-12-03 DIAGNOSIS — L089 Local infection of the skin and subcutaneous tissue, unspecified: Secondary | ICD-10-CM

## 2016-12-03 DIAGNOSIS — T148XXA Other injury of unspecified body region, initial encounter: Secondary | ICD-10-CM

## 2016-12-03 DIAGNOSIS — I2510 Atherosclerotic heart disease of native coronary artery without angina pectoris: Secondary | ICD-10-CM | POA: Diagnosis present

## 2016-12-03 DIAGNOSIS — J449 Chronic obstructive pulmonary disease, unspecified: Secondary | ICD-10-CM | POA: Diagnosis present

## 2016-12-03 DIAGNOSIS — R262 Difficulty in walking, not elsewhere classified: Secondary | ICD-10-CM

## 2016-12-03 DIAGNOSIS — L03116 Cellulitis of left lower limb: Secondary | ICD-10-CM | POA: Diagnosis present

## 2016-12-03 DIAGNOSIS — R140 Abdominal distension (gaseous): Secondary | ICD-10-CM

## 2016-12-03 LAB — TROPONIN I: Troponin I: <0.03 ng/mL (ref <0.03)

## 2016-12-03 LAB — CBC
HEMATOCRIT: 36.3 % — AB (ref 40.0–52.0)
HEMOGLOBIN: 11.8 g/dL — AB (ref 13.0–18.0)
MCH: 27.5 pg (ref 26.0–34.0)
MCHC: 32.4 g/dL (ref 32.0–36.0)
MCV: 85 fL (ref 80.0–100.0)
Platelets: 562 10*3/uL — ABNORMAL HIGH (ref 150–440)
RBC: 4.27 MIL/uL — AB (ref 4.40–5.90)
RDW: 18.1 % — ABNORMAL HIGH (ref 11.5–14.5)
WBC: 13.6 10*3/uL — ABNORMAL HIGH (ref 3.8–10.6)

## 2016-12-03 LAB — URINALYSIS, COMPLETE (UACMP) WITH MICROSCOPIC
Bacteria, UA: NONE SEEN
Bilirubin Urine: NEGATIVE
Glucose, UA: NEGATIVE mg/dL
Hgb urine dipstick: NEGATIVE
Ketones, ur: 5 mg/dL — AB
Leukocytes, UA: NEGATIVE
Nitrite: NEGATIVE
Protein, ur: NEGATIVE mg/dL
SPECIFIC GRAVITY, URINE: 1.017 (ref 1.005–1.030)
SQUAMOUS EPITHELIAL / LPF: NONE SEEN
pH: 5 (ref 5.0–8.0)

## 2016-12-03 LAB — LACTIC ACID, PLASMA
LACTIC ACID, VENOUS: 1 mmol/L (ref 0.5–1.9)
LACTIC ACID, VENOUS: 1.2 mmol/L (ref 0.5–1.9)

## 2016-12-03 LAB — BASIC METABOLIC PANEL
ANION GAP: 7 (ref 5–15)
BUN: 28 mg/dL — ABNORMAL HIGH (ref 6–20)
CO2: 32 mmol/L (ref 22–32)
Calcium: 11.1 mg/dL — ABNORMAL HIGH (ref 8.9–10.3)
Chloride: 97 mmol/L — ABNORMAL LOW (ref 101–111)
Creatinine, Ser: 1.01 mg/dL (ref 0.61–1.24)
GLUCOSE: 125 mg/dL — AB (ref 65–99)
POTASSIUM: 4.1 mmol/L (ref 3.5–5.1)
Sodium: 136 mmol/L (ref 135–145)

## 2016-12-03 LAB — SEDIMENTATION RATE: Sed Rate: 45 mm/hr — ABNORMAL HIGH (ref 0–20)

## 2016-12-03 LAB — MRSA PCR SCREENING: MRSA by PCR: NEGATIVE

## 2016-12-03 MED ORDER — OXYCODONE HCL 5 MG PO TABS
5.0000 mg | ORAL_TABLET | ORAL | Status: DC | PRN
  Administered 2016-12-04 – 2016-12-06 (×6): 5 mg via ORAL
  Filled 2016-12-03 (×6): qty 1

## 2016-12-03 MED ORDER — ACETAMINOPHEN 650 MG RE SUPP: 650.0000 mg | Freq: Four times a day (QID) | RECTAL | Status: DC | PRN

## 2016-12-03 MED ORDER — VANCOMYCIN HCL IN DEXTROSE 1-5 GM/200ML-% IV SOLN
INTRAVENOUS | Status: AC
  Administered 2016-12-03: 1 g via INTRAVENOUS
  Filled 2016-12-03: qty 200

## 2016-12-03 MED ORDER — PIPERACILLIN-TAZOBACTAM 3.375 G IVPB
3.3750 g | Freq: Three times a day (TID) | INTRAVENOUS | Status: DC
  Administered 2016-12-03 – 2016-12-06 (×9): 3.375 g via INTRAVENOUS
  Filled 2016-12-03 (×9): qty 50

## 2016-12-03 MED ORDER — MIDODRINE HCL 5 MG PO TABS
10.0000 mg | ORAL_TABLET | Freq: Three times a day (TID) | ORAL | Status: DC
  Administered 2016-12-03 – 2016-12-07 (×12): 10 mg via ORAL
  Filled 2016-12-03 (×12): qty 2

## 2016-12-03 MED ORDER — SODIUM CHLORIDE 0.9 % IV SOLN
INTRAVENOUS | Status: DC
  Administered 2016-12-03 – 2016-12-04 (×2): via INTRAVENOUS
  Administered 2016-12-04: 75 mL/h via INTRAVENOUS
  Administered 2016-12-07: 02:00:00 via INTRAVENOUS

## 2016-12-03 MED ORDER — FLUDROCORTISONE ACETATE 0.1 MG PO TABS
0.1000 mg | ORAL_TABLET | Freq: Every day | ORAL | Status: DC
  Administered 2016-12-04 – 2016-12-07 (×4): 0.1 mg via ORAL
  Filled 2016-12-03 (×4): qty 1

## 2016-12-03 MED ORDER — ONDANSETRON HCL 4 MG PO TABS
4.0000 mg | ORAL_TABLET | Freq: Four times a day (QID) | ORAL | Status: DC | PRN
  Administered 2016-12-06: 4 mg via ORAL
  Filled 2016-12-03: qty 1

## 2016-12-03 MED ORDER — METOCLOPRAMIDE HCL 10 MG PO TABS
10.0000 mg | ORAL_TABLET | Freq: Three times a day (TID) | ORAL | Status: DC
  Administered 2016-12-03 – 2016-12-07 (×16): 10 mg via ORAL
  Filled 2016-12-03 (×16): qty 1

## 2016-12-03 MED ORDER — SERTRALINE HCL 50 MG PO TABS
25.0000 mg | ORAL_TABLET | Freq: Every day | ORAL | Status: DC
  Administered 2016-12-04 – 2016-12-07 (×4): 25 mg via ORAL
  Filled 2016-12-03 (×3): qty 1
  Filled 2016-12-03: qty 2

## 2016-12-03 MED ORDER — ENOXAPARIN SODIUM 40 MG/0.4ML ~~LOC~~ SOLN
40.0000 mg | SUBCUTANEOUS | Status: DC
Start: 2016-12-03 — End: 2016-12-07
  Administered 2016-12-03 – 2016-12-06 (×4): 40 mg via SUBCUTANEOUS
  Filled 2016-12-03 (×4): qty 0.4

## 2016-12-03 MED ORDER — LEVOTHYROXINE SODIUM 100 MCG PO TABS
100.0000 ug | ORAL_TABLET | Freq: Every day | ORAL | Status: DC
  Administered 2016-12-04 – 2016-12-07 (×4): 100 ug via ORAL
  Filled 2016-12-03 (×4): qty 1

## 2016-12-03 MED ORDER — ONDANSETRON HCL 4 MG/2ML IJ SOLN: 4.0000 mg | Freq: Four times a day (QID) | INTRAMUSCULAR | Status: DC | PRN

## 2016-12-03 MED ORDER — VANCOMYCIN HCL IN DEXTROSE 1-5 GM/200ML-% IV SOLN
1000.0000 mg | INTRAVENOUS | Status: DC
  Administered 2016-12-03 – 2016-12-06 (×4): 1000 mg via INTRAVENOUS
  Filled 2016-12-03 (×6): qty 200

## 2016-12-03 MED ORDER — MEGESTROL ACETATE 40 MG PO TABS
40.0000 mg | ORAL_TABLET | Freq: Every day | ORAL | Status: DC
  Administered 2016-12-04 – 2016-12-07 (×4): 40 mg via ORAL
  Filled 2016-12-03 (×4): qty 1

## 2016-12-03 MED ORDER — ASPIRIN EC 81 MG PO TBEC
81.0000 mg | DELAYED_RELEASE_TABLET | Freq: Every morning | ORAL | Status: DC
  Administered 2016-12-04 – 2016-12-07 (×4): 81 mg via ORAL
  Filled 2016-12-03 (×4): qty 1

## 2016-12-03 MED ORDER — PIPERACILLIN-TAZOBACTAM 3.375 G IVPB 30 MIN
3.3750 g | Freq: Once | INTRAVENOUS | Status: AC
  Administered 2016-12-03: 3.375 g via INTRAVENOUS
  Filled 2016-12-03: qty 50

## 2016-12-03 MED ORDER — SODIUM CHLORIDE 0.9 % IV BOLUS (SEPSIS)
500.0000 mL | Freq: Once | INTRAVENOUS | Status: AC
  Administered 2016-12-03: 500 mL via INTRAVENOUS

## 2016-12-03 MED ORDER — VANCOMYCIN HCL IN DEXTROSE 1-5 GM/200ML-% IV SOLN
1000.0000 mg | INTRAVENOUS | Status: DC
Start: 2016-12-03 — End: 2016-12-03
  Administered 2016-12-03: 1 g via INTRAVENOUS

## 2016-12-03 MED ORDER — FINASTERIDE 5 MG PO TABS
5.0000 mg | ORAL_TABLET | Freq: Every morning | ORAL | Status: DC
  Administered 2016-12-04 – 2016-12-07 (×4): 5 mg via ORAL
  Filled 2016-12-03 (×4): qty 1

## 2016-12-03 MED ORDER — ENSURE ENLIVE PO LIQD
237.0000 mL | Freq: Two times a day (BID) | ORAL | Status: DC
  Administered 2016-12-03 – 2016-12-04 (×3): 237 mL via ORAL

## 2016-12-03 MED ORDER — HYDROCODONE-ACETAMINOPHEN 5-325 MG PO TABS
1.0000 | ORAL_TABLET | Freq: Once | ORAL | Status: AC
  Administered 2016-12-03: 1 via ORAL
  Filled 2016-12-03: qty 1

## 2016-12-03 MED ORDER — SENNOSIDES-DOCUSATE SODIUM 8.6-50 MG PO TABS
2.0000 | ORAL_TABLET | Freq: Two times a day (BID) | ORAL | Status: DC
Start: 2016-12-03 — End: 2016-12-07
  Administered 2016-12-03 – 2016-12-07 (×7): 2 via ORAL
  Filled 2016-12-03 (×13): qty 2

## 2016-12-03 MED ORDER — ACETAMINOPHEN 325 MG PO TABS
650.0000 mg | ORAL_TABLET | Freq: Four times a day (QID) | ORAL | Status: DC | PRN
  Filled 2016-12-03: qty 2

## 2016-12-03 MED ORDER — MOMETASONE FURO-FORMOTEROL FUM 100-5 MCG/ACT IN AERO
2.0000 | INHALATION_SPRAY | Freq: Two times a day (BID) | RESPIRATORY_TRACT | Status: DC
  Administered 2016-12-03 – 2016-12-07 (×8): 2 via RESPIRATORY_TRACT
  Filled 2016-12-03: qty 8.8

## 2016-12-03 NOTE — ED Notes (Signed)
Pt awaiting to be seen by MD

## 2016-12-03 NOTE — ED Triage Notes (Signed)
Pt to ed via [facility] with reports of declining health over the last several days. Pt comes to ed from [facility], recent left knee surgery, still in knee brace on arrival.  Pt denies any chest pain, shortness of breath, denies v/d. Pt states he did vomit prior to arrival and has some nausea. Pt alert and oriented. Ems reports all vss. Pt with no acute distress noted.

## 2016-12-03 NOTE — Progress Notes (Signed)
Pharmacy Antibiotic Note  Kurt Hill is a 82 y.o. male admitted on 12/03/2016 with cellulitis/wound infection.  Pharmacy has been consulted for vancomycin and piperacillin/tazobactam dosing.  Plan: Piperacillin/tazobactam 3.375 g IV q8h EI  Vancomycin 1000 mg dose given in ED. Will order vancomycin 1000 mg IV q18h (8 hour stacked dosing) Goal vancomycin trough 15-20 mcg/mL Vancomycin trough ordered for 12/29 @ 2130  Kinetics: Using actual body weight = 73 kg Ke: 0.053 Half-life: 13 hrs Vd: 51 L Cmin (estimated) ~15 mcg/mL  Weight: 161 lb (73 kg)  Temp (24hrs), Avg:97.6 F (36.4 C), Min:97.6 F (36.4 C), Max:97.6 F (36.4 C)   Recent Labs Lab 11/28/16 1754 12/03/16 1055 12/03/16 1252  WBC 8.2 13.6*  --   CREATININE 1.00 1.01  --   LATICACIDVEN  --   --  1.0    Estimated Creatinine Clearance: 58.2 mL/min (by C-G formula based on SCr of 1.01 mg/dL).    Allergies  Allergen Reactions  . Ivp Dye [Iodinated Diagnostic Agents] Itching and Rash  . Sulfa Antibiotics Rash   Antimicrobials this admission: vancomycin 12/26 >>  Piperacillin/tazobactam 12/26 >>   Dose adjustments this admission:  Microbiology results: 12/26 BCx: Sent  Thank you for allowing pharmacy to be a part of this patient's care.  Lenis Noon, PharmD Clinical Pharmacist 12/03/2016 2:50 PM

## 2016-12-03 NOTE — H&P (Signed)
[facility] NAME: Kurt Hill    MR#:  [id number]  DATE OF BIRTH:  [birth year]   DATE OF ADMISSION:  12/03/2016  PRIMARY CARE PHYSICIAN: Tracie Harrier, MD   REQUESTING/REFERRING PHYSICIAN: Marcelene Butte  CHIEF COMPLAINT:   Chief Complaint  Patient presents with  . Fatigue    HISTORY OF PRESENT ILLNESS:  Kurt Hill  is a 82 y.o. male with a known history of Hypotension on midodrine, failure left patellar surgery who is presenting from his nursing facility for "declining health"  Patient states he has been dealing with nausea for some time and had 1 episode of vomiting nonbloody nonbilious emesis but denied further symptomatology denies fevers chills.  Emergency department Course: Wound dressings dated 11/27/2016 with foul odor, dressings taken down  PAST MEDICAL HISTORY:   Past Medical History:  Diagnosis Date  . Anemia   . Asthma   . BPH (benign prostatic hyperplasia)   . COPD (chronic obstructive pulmonary disease) (HCC)    not on home oxygen  . Coronary artery disease   . Hypotension   . Hypothyroidism   . Skin abnormalities   . Skin cancer     PAST SURGICAL HISTORY:   Past Surgical History:  Procedure Laterality Date  . APPENDECTOMY    . BONE RESECTION     on nose  . MOHS SURGERY    . ORIF PATELLA Left 07/30/2016   Procedure: OPEN REDUCTION INTERNAL (ORIF) FIXATION PATELLA;  Surgeon: Hessie Knows, MD;  Location: [facility];  Service: Orthopedics;  Laterality: Left;  . ORIF PATELLA Left 08/15/2016   Procedure: OPEN REDUCTION INTERNAL (ORIF) FIXATION PATELLA;  Surgeon: Hessie Knows, MD;  Location: [facility];  Service: Orthopedics;  Laterality: Left;  . ORIF PATELLA Left 10/14/2016   Procedure: OPEN REDUCTION INTERNAL (ORIF) FIXATION PATELLA;  Surgeon: Hessie Knows, MD;  Location: [facility];  Service: Orthopedics;  Laterality: Left;  . TONSILLECTOMY      SOCIAL HISTORY:   Social History  Substance Use Topics  .  Smoking status: Never Smoker  . Smokeless tobacco: Never Used     Comment: tried cigarettes when young- but never really smoked  . Alcohol use No    FAMILY HISTORY:   Family History  Problem Relation Age of Onset  . CAD Other     DRUG ALLERGIES:   Allergies  Allergen Reactions  . Ivp Dye [Iodinated Diagnostic Agents] Itching and Rash  . Sulfa Antibiotics Rash    REVIEW OF SYSTEMS:  REVIEW OF SYSTEMS:  CONSTITUTIONAL: Denies fevers, chills,Positive fatigue, weakness.  EYES: Denies blurred vision, double vision, or eye pain.  EARS, NOSE, THROAT: Denies tinnitus, ear pain, hearing loss.  RESPIRATORY: denies cough, shortness of breath, wheezing  CARDIOVASCULAR: Denies chest pain, palpitations, edema.  GASTROINTESTINAL: Positive nausea, vomiting, denies diarrhea, abdominal pain.  GENITOURINARY: Denies dysuria, hematuria.  ENDOCRINE: Denies nocturia or thyroid problems. HEMATOLOGIC AND LYMPHATIC: Denies easy bruising or bleeding.  SKIN: Positive left leg wounds otherwise Denies rash or lesions.  MUSCULOSKELETAL: Denies pain in neck, back, shoulder, knees, hips, or further arthritic symptoms.  NEUROLOGIC: Denies paralysis, paresthesias.  PSYCHIATRIC: Denies anxiety or depressive symptoms. Otherwise full review of systems performed by me is negative.   MEDICATIONS AT HOME:   Prior to Admission medications   Medication Sig Start Date End Date Taking? Authorizing Provider  acetaminophen (TYLENOL) 325 MG tablet Take by mouth every 4 (four) hours as needed.   Yes Historical Provider, MD  albuterol (PROAIR  HFA) 108 (90 Base) MCG/ACT inhaler Inhale 2 puffs into the lungs every 6 (six) hours as needed for wheezing or shortness of breath.  12/06/15 12/05/16 Yes Historical Provider, MD  albuterol (PROVENTIL) (2.5 MG/3ML) 0.083% nebulizer solution Take 2.5 mg by nebulization every 6 (six) hours as needed for wheezing or shortness of breath.   Yes Historical Provider, MD  alum & mag  hydroxide-simeth (MYLANTA) 200-200-20 MG/5ML suspension Take 30 mLs by mouth every 6 (six) hours as needed for indigestion or heartburn.   Yes Historical Provider, MD  aspirin EC 81 MG tablet Take 81 mg by mouth every morning.   Yes Historical Provider, MD  budesonide-formoterol (SYMBICORT) 80-4.5 MCG/ACT inhaler Inhale 2 puffs into the lungs 2 (two) times daily.   Yes Historical Provider, MD  finasteride (PROSCAR) 5 MG tablet Take 5 mg by mouth every morning. 03/12/16 03/12/17 Yes Historical Provider, MD  fludrocortisone (FLORINEF) 0.1 MG tablet Take 0.1 mg by mouth daily.   Yes Historical Provider, MD  lactulose (CHRONULAC) 10 GM/15ML solution Take 45 mLs (30 g total) by mouth daily as needed for moderate constipation. 09/17/16  Yes Paulette Blanch, MD  levothyroxine (SYNTHROID, LEVOTHROID) 100 MCG tablet Take 1 tablet by mouth daily. 09/04/15  Yes Historical Provider, MD  megestrol (MEGACE) 40 MG tablet Take 40 mg by mouth daily.   Yes Historical Provider, MD  metoCLOPramide (REGLAN) 10 MG tablet Take 10 mg by mouth 4 (four) times daily -  before meals and at bedtime.   Yes Historical Provider, MD  midodrine (PROAMATINE) 5 MG tablet Take 1 tablet (5 mg total) by mouth 3 (three) times daily with meals. Patient taking differently: Take 10 mg by mouth 3 (three) times daily with meals.  10/19/16  Yes Sital Mody, MD  ondansetron (ZOFRAN) 4 MG tablet Take 4 mg by mouth every 8 (eight) hours as needed for nausea or vomiting.   Yes Historical Provider, MD  oxyCODONE (OXY IR/ROXICODONE) 5 MG immediate release tablet Take 1 tablet (5 mg total) by mouth every 4 (four) hours as needed for moderate pain or severe pain. 10/19/16  Yes Bettey Costa, MD  polyethylene glycol (MIRALAX / GLYCOLAX) packet Take 17 g by mouth daily. 10/20/16  Yes Bettey Costa, MD  sennosides-docusate sodium (SENOKOT-S) 8.6-50 MG tablet Take 2 tablets by mouth 2 (two) times daily.   Yes Historical Provider, MD  sertraline (ZOLOFT) 25 MG tablet Take 25  mg by mouth daily.   Yes Historical Provider, MD  Vitamin D, Ergocalciferol, (DRISDOL) 50000 units CAPS capsule Take 1 capsule by mouth every 7 (seven) days. 09/04/15  Yes Historical Provider, MD  collagenase (SANTYL) ointment Apply topically daily. Patient not taking: Reported on 12/03/2016 10/19/16   Bettey Costa, MD  feeding supplement, ENSURE ENLIVE, (ENSURE ENLIVE) LIQD Take 237 mLs by mouth 2 (two) times daily between meals. 10/19/16   Bettey Costa, MD  mupirocin ointment (BACTROBAN) 2 % Place 1 application into the nose 2 (two) times daily. Patient not taking: Reported on 12/03/2016 09/02/16   Duanne Guess, PA-C      VITAL SIGNS:  Blood pressure 139/79, pulse 68, temperature 97.6 F (36.4 C), temperature source Oral, resp. rate 17, weight 73 kg (161 lb), SpO2 93 %.  PHYSICAL EXAMINATION:  VITAL SIGNS: Vitals:   12/03/16 1230 12/03/16 1300  BP: (!) 141/71 139/79  Pulse: 64 68  Resp: 17 17  Temp:     GENERAL:82 y.o.male currently in no acute distress. Chronically ill-appearing gentleman disheveled HEAD: Normocephalic,  atraumatic.  EYES: Bilateral conjunctivitis Pupils equal, round, reactive to light. Extraocular muscles intact. No scleral icterus.  MOUTH: Moist mucosal membrane. Dentition intact. No abscess noted.  EAR, NOSE, THROAT: Clear without exudates. No external lesions.  NECK: Supple. No thyromegaly. No nodules. No JVD.  PULMONARY: Clear to ascultation, without wheeze rails or rhonci. No use of accessory muscles, Good respiratory effort. good air entry bilaterally CHEST: Nontender to palpation.  CARDIOVASCULAR: S1 and S2. Regular rate and rhythm. No murmurs, rubs, or gallops. No edema. Pedal pulses 2+ bilaterally.  GASTROINTESTINAL: Soft, nontender, nondistended. No masses. Positive bowel sounds. No hepatosplenomegaly.  MUSCULOSKELETAL: No swelling, clubbing, or edema. Range of motion full in all extremities.  NEUROLOGIC: Cranial nerves II through XII are intact. No gross  focal neurological deficits. Sensation intact. Reflexes intact.  SKIN: Left heel weeping/oozing wound with surrounding erythema, left shin skin abrasion also weeping with surrounding edema, left patella exposed metal wire otherwise No ulceration, lesions, rashes, or cyanosis. Skin warm and dry. Turgor intact.  PSYCHIATRIC: Mood, affect within normal limits. The patient is awake, alert and oriented x 3. Insight, judgment intact.    LABORATORY PANEL:   CBC  Recent Labs Lab 12/03/16 1055  WBC 13.6*  HGB 11.8*  HCT 36.3*  PLT 562*   ------------------------------------------------------------------------------------------------------------------  Chemistries   Recent Labs Lab 11/28/16 1754 12/03/16 1055  NA 134* 136  K 4.5 4.1  CL 98* 97*  CO2 31 32  GLUCOSE 95 125*  BUN 26* 28*  CREATININE 1.00 1.01  CALCIUM 8.9 11.1*  AST 26  --   ALT 16*  --   ALKPHOS 86  --   BILITOT 0.2*  --    ------------------------------------------------------------------------------------------------------------------  Cardiac Enzymes  Recent Labs Lab 12/03/16 1055  TROPONINI <0.03   ------------------------------------------------------------------------------------------------------------------  RADIOLOGY:  Dg Chest Port 1 View  Result Date: 12/03/2016 CLINICAL DATA:  Altered mental status EXAM: PORTABLE CHEST 1 VIEW COMPARISON:  November 28, 2016 FINDINGS: There is no edema or consolidation. The heart size and pulmonary vascularity are normal. No adenopathy. No bone lesions. IMPRESSION: No edema or consolidation. Electronically Signed   By: Lowella Grip III M.D.   On: 12/03/2016 12:20    EKG:   Orders placed or performed during the hospital encounter of 12/03/16  . EKG 12-Lead  . EKG 12-Lead  . EKG 12-Lead  . EKG 12-Lead    IMPRESSION AND PLAN:   82 year old Caucasian gentleman history of hypotension and a patellar fracture presenting for "declining health"  1.  Cellulitis/wound infection: Left heel, left patella: Vancomycin/Zosyn and wound cultures blood cultures follow culture data, wound consult, orthopedic consult, check MRI to rule out osteomyelitis May need podiatry's involvement  2. Hypothyroidism unspecified: Synthroid 3. COPD unspecified: Continue home medications 4. BPH: Proscar 5. Venous thromboembolism prophylactic: Lovenox    All the records are reviewed and case discussed with ED provider. Management plans discussed with the patient, family and they are in agreement.  CODE STATUS: Full  TOTAL TIME TAKING CARE OF THIS PATIENT: 33 minutes.    Hower,  Karenann Cai.D on 12/03/2016 at 2:17 PM  Between 7am to 6pm - Pager - [phone]  After 6pm: House Pager: - [phone]  [facility]  Office  [phone]  CC: Primary care physician; Tracie Harrier, MD

## 2016-12-03 NOTE — ED Provider Notes (Addendum)
Time Seen: Approximately 1116  I have reviewed the triage notes  Chief Complaint: Fatigue   History of Present Illness: Kurt Hill is a 82 y.o. male who was transported here via EMS from [place] for evaluation of "declining health "". Patient denies any physical complaints. He is limited historian and has a history of COPD and previous left patellar fracture repair. He has a postoperative infection that time and has some pressure ulcers especially on his left foot. Patient describes some vomiting prior to arrival and states he still has some nausea. He denies any loose stool or diarrhea.   Past Medical History:  Diagnosis Date  . Anemia   . Asthma   . BPH (benign prostatic hyperplasia)   . COPD (chronic obstructive pulmonary disease) (HCC)    not on home oxygen  . Coronary artery disease   . Hypotension   . Hypothyroidism   . Skin abnormalities   . Skin cancer     Patient Active Problem List   Diagnosis Date Noted  . Pressure injury of skin 10/13/2016  . Cellulitis of left leg 08/30/2016  . Post op infection 08/30/2016  . Cellulitis 08/30/2016  . Left patella fracture 08/15/2016  . Patella fracture 07/30/2016  . COPD exacerbation ([place]) 12/14/2015    Past Surgical History:  Procedure Laterality Date  . APPENDECTOMY    . BONE RESECTION     on nose  . MOHS SURGERY    . ORIF PATELLA Left 07/30/2016   Procedure: OPEN REDUCTION INTERNAL (ORIF) FIXATION PATELLA;  Surgeon: Hessie Knows, MD;  Location: [facility];  Service: Orthopedics;  Laterality: Left;  . ORIF PATELLA Left 08/15/2016   Procedure: OPEN REDUCTION INTERNAL (ORIF) FIXATION PATELLA;  Surgeon: Hessie Knows, MD;  Location: [facility];  Service: Orthopedics;  Laterality: Left;  . ORIF PATELLA Left 10/14/2016   Procedure: OPEN REDUCTION INTERNAL (ORIF) FIXATION PATELLA;  Surgeon: Hessie Knows, MD;  Location: [facility];  Service: Orthopedics;  Laterality: Left;  . TONSILLECTOMY      Past  Surgical History:  Procedure Laterality Date  . APPENDECTOMY    . BONE RESECTION     on nose  . MOHS SURGERY    . ORIF PATELLA Left 07/30/2016   Procedure: OPEN REDUCTION INTERNAL (ORIF) FIXATION PATELLA;  Surgeon: Hessie Knows, MD;  Location: [facility];  Service: Orthopedics;  Laterality: Left;  . ORIF PATELLA Left 08/15/2016   Procedure: OPEN REDUCTION INTERNAL (ORIF) FIXATION PATELLA;  Surgeon: Hessie Knows, MD;  Location: [facility];  Service: Orthopedics;  Laterality: Left;  . ORIF PATELLA Left 10/14/2016   Procedure: OPEN REDUCTION INTERNAL (ORIF) FIXATION PATELLA;  Surgeon: Hessie Knows, MD;  Location: [facility];  Service: Orthopedics;  Laterality: Left;  . TONSILLECTOMY      Current Outpatient Rx  . Order #: [id number] Class: Historical Med  . Order #: [id number] Class: Historical Med  . Order #: [id number] Class: Historical Med  . Order #: [id number] Class: Historical Med  . Order #: [id number] Class: Historical Med  . Order #: [id number] Class: Historical Med  . Order #: [id number] Class: Historical Med  . Order #: [id number] Class: Historical Med  . Order #: [id number] Class: Print  . Order #: [id number] Class: Historical Med  . Order #: [id number] Class: Historical Med  . Order #: [id number] Class: Historical Med  . Order #: [id number] Class: Normal  . Order #: [id number] Class: Historical Med  . Order #: [id number] Class: Print  . Order #: [id number] Class: Print  . Order #: [id number] Class: Historical Med  . Order #:  [id number] Class: Historical Med  . Order #: [id number] Class: Historical Med  . Order #: [id number] Class: Print  . Order #: [id number] Class: Normal  . Order #: [id number] Class: Normal    Allergies:  Ivp dye [iodinated diagnostic agents] and Sulfa antibiotics  Family History: No family history on file.  Social History: Social History  Substance Use Topics  . Smoking status: Never Smoker  . Smokeless tobacco: Never Used     Comment: tried cigarettes when young- but never really smoked   . Alcohol use No     Review of Systems:   10 point review of systems was performed and was otherwise negative: Limited review of systems from the patient and mostly review of systems taken from the record etc. Constitutional: No fever Eyes: No visual disturbances ENT: No sore throat, ear pain Cardiac: No chest pain Respiratory: No shortness of breath, wheezing, or stridor Abdomen: No abdominal pain, no vomiting, No diarrhea Endocrine: No weight loss, No night sweats Extremities: No peripheral edema, cyanosis Skin: No rashes, easy bruising Neurologic: No focal weakness, trouble with speech or swollowing Urologic: No dysuria, Hematuria, or urinary frequency  Physical Exam:  ED Triage Vitals  Enc Vitals Group     BP 12/03/16 1030 (!) 153/81     Pulse Rate 12/03/16 1030 67     Resp 12/03/16 1030 (!) 21     Temp 12/03/16 1037 97.6 F (36.4 C)     Temp Source 12/03/16 1037 Oral     SpO2 12/03/16 1030 93 %     Weight 12/03/16 1037 161 lb (73 kg)     Height --      Head Circumference --      Peak Flow --      Pain Score 12/03/16 1329 Asleep     Pain Loc --      Pain Edu? --      Excl. in [place]? --     General: Awake , Alert , and Oriented times 3; GCS 15 Head: Normal cephalic , atraumatic Eyes: Pupils equal , round, reactive to light. Parents of glaucoma Nose/Throat: No nasal drainage, patent upper airway without erythema or exudate.  Neck: Supple, Full range of motion, No anterior adenopathy or palpable thyroid masses Lungs: Clear to ascultation without wheezes , rhonchi, or rales Heart: Regular rate, regular rhythm without murmurs , gallops , or rubs Abdomen: Soft, non tender without rebound, guarding , or rigidity; bowel sounds positive and symmetric in all 4 quadrants. No organomegaly .        Extremities: Examination of the left lower extremities after stripping down the knee brace and also dressing on the heel of the left foot. The previous surgical wound at the knee  does not show any surrounding erythema and only some very mild serosanguineous drainage. The date on the wound cover for this postoperative wound was stated 12/20 The patient examination of the left heel shows a foul smell with no date: The wound cover. Approximately 3 cm pressure ulcer with some surrounding erythema Neurologic: normal ambulation, Motor symmetric without deficits, sensory intact Skin: warm, dry, no rashes EKG: ED ECG REPORT I, Daymon Larsen, the attending physician, personally viewed and interpreted this ECG.  Date: 12/03/2016 EKG Time: *1031 Rate:69 Rhythm: normal sinus rhythm QRS Axis: normal Intervals: normal ST/T Wave abnormalities: normal Conduction Disturbances: none Narrative Interpretation: unremarkable Poor quality EKG Prolonged QT interval No acute ischemic changes  Labs:   All laboratory work was reviewed including any pertinent negatives or positives listed below:  Labs Reviewed  BASIC METABOLIC PANEL - Abnormal; Notable for the following:       Result Value   Chloride 97 (*)    Glucose, Bld 125 (*)    BUN 28 (*)    Calcium 11.1 (*)    All other components within normal limits  CBC - Abnormal; Notable for the following:    WBC 13.6 (*)    RBC 4.27 (*)    Hemoglobin 11.8 (*)    HCT 36.3 (*)    RDW 18.1 (*)    Platelets 562 (*)    All other components within normal limits  URINALYSIS, COMPLETE (UACMP) WITH MICROSCOPIC - Abnormal; Notable for the following:    Color, Urine YELLOW (*)    APPearance CLEAR (*)    Ketones, ur 5 (*)    All other components within normal limits  SEDIMENTATION RATE - Abnormal; Notable for the following:    Sed Rate 45 (*)    All other components within normal limits  CULTURE, BLOOD (ROUTINE X 2)  CULTURE, BLOOD (ROUTINE X 2)  TROPONIN I  LACTIC ACID, PLASMA  LACTIC ACID, PLASMA  CBG MONITORING, ED  Low blood cell count is elevated in comparison to recent visits and sedimentation rate is also elevated.  Blood  cultures 2 are pending  Radiology: * "Dg Chest 1 View  Result Date: 11/28/2016 CLINICAL DATA:  Hypotension. EXAM: CHEST 1 VIEW COMPARISON:  07/30/2016 FINDINGS: 1818 hours. The cardio pericardial silhouette is enlarged. There is pulmonary vascular congestion without overt pulmonary edema. Appearance of air-fluid level at the left base is probably artifact. Interstitial markings are diffusely coarsened with chronic features. Bones are diffusely demineralized. Telemetry leads overlie the chest. IMPRESSION: Cardiomegaly with chronic underlying interstitial changes. Probable artifact at the left base although air-fluid level could have this appearance. Dedicated PA and lateral chest x-ray, when patient is able, recommended to further evaluate. Electronically Signed   By: Misty Stanley M.D.   On: 11/28/2016 18:41   Dg Chest 2 View  Result Date: 11/28/2016 CLINICAL DATA:  Hypotension. EXAM: CHEST  2 VIEW COMPARISON:  Portable AP view earlier this day. FINDINGS: The previous question air-fluid level at the left lung base is no longer seen and was likely artifact. Mildly enlarged cardiac silhouette is unchanged. There are small bilateral pleural effusions. Minimal fluid in the fissures. Increased interstitial markings are again seen, may be chronic, however are slightly more prominent than on prior exam. There is no pneumothorax. No acute osseous abnormality. IMPRESSION: 1. Small pleural effusions. Increased interstitial markings may be chronic, however appear increased from prior exam raising concern for pulmonary edema. Findings may reflect mild fluid overload. 2. Previous questioned air-fluid level projecting over the left lung base is not seen and is likely artifact. Electronically Signed   By: Jeb Levering M.D.   On: 11/28/2016 21:39   Dg Chest Port 1 View  Result Date: 12/03/2016 CLINICAL DATA:  Altered mental status EXAM: PORTABLE CHEST 1 VIEW COMPARISON:  November 28, 2016 FINDINGS: There is no  edema or consolidation. The heart size and pulmonary vascularity are normal. No adenopathy. No bone lesions. IMPRESSION: No edema or consolidation. Electronically Signed   By: Lowella Grip III M.D.   On: 12/03/2016 12:20   Dg Foot Complete Left  Result Date: 11/28/2016 CLINICAL DATA:  Nonhealing left heel wound. EXAM: LEFT FOOT - COMPLETE 3+ VIEW COMPARISON:  None. FINDINGS: After recent knee surgery there is difficulty with positioning the foot and the dorsal aspect  of the heel is obscured by collimator. Per technologist the patient's wound is on the plantar surface of the heel, which is visualized. There is no opaque foreign body, soft tissue gas, or evidence of osteomyelitis. No fracture deformity or subluxation. Hammertoe deformities. Osteopenia. IMPRESSION: No evidence of osteomyelitis or foreign body. Please see description above. Electronically Signed   By: Monte Fantasia M.D.   On: 11/28/2016 18:44  "  I personally reviewed the radiologic studies     ED Course: * Patient now has an elevated white blood cell count in comparison to his previous visits and blood cultures 2 were obtained. The left heel did not show any signs of osteomyelitis per x-ray evaluation on 11/28/2016. However, today the wound seems to have a surrounding erythema and a foul smell. Questionable when the dressing was last changed on his left heel. The patient was started on Zosyn Clinical Course     Final Clinical Impression:   Final diagnoses:  Wound infection  Failure to thrive   Plan:  Inpatient management            Daymon Larsen, MD 12/03/16 [id number]    Daymon Larsen, MD 12/03/16 [phone]

## 2016-12-03 NOTE — ED Notes (Signed)
Assisted Dr Marcelene Butte in removing dressing from front of left leg and left heel - dsg'ing dated 12/20 - left knee area noted to have yellow drainage - left heel area weeping with large amount of yellow drainage noted - foul odor coming from wound - per MD left areas open (not redressed) until admitting MD can assess areas

## 2016-12-03 NOTE — ED Notes (Signed)
Patient denies pain and is resting comfortably.  

## 2016-12-03 NOTE — Care Management (Signed)
From [facility]. CSW updated. Will follow progression.

## 2016-12-04 ENCOUNTER — Inpatient Hospital Stay: Payer: Medicare Other

## 2016-12-04 DIAGNOSIS — E43 Unspecified severe protein-calorie malnutrition: Secondary | ICD-10-CM | POA: Insufficient documentation

## 2016-12-04 LAB — BASIC METABOLIC PANEL
Anion gap: 7 (ref 5–15)
BUN: 26 mg/dL — AB (ref 6–20)
CHLORIDE: 101 mmol/L (ref 101–111)
CO2: 30 mmol/L (ref 22–32)
CREATININE: 1.04 mg/dL (ref 0.61–1.24)
Calcium: 10.7 mg/dL — ABNORMAL HIGH (ref 8.9–10.3)
Glucose, Bld: 95 mg/dL (ref 65–99)
Potassium: 4.1 mmol/L (ref 3.5–5.1)
SODIUM: 138 mmol/L (ref 135–145)

## 2016-12-04 LAB — CBC
HCT: 36.7 % — ABNORMAL LOW (ref 40.0–52.0)
Hemoglobin: 11.8 g/dL — ABNORMAL LOW (ref 13.0–18.0)
MCH: 27.9 pg (ref 26.0–34.0)
MCHC: 32.2 g/dL (ref 32.0–36.0)
MCV: 86.7 fL (ref 80.0–100.0)
Platelets: 442 K/uL — ABNORMAL HIGH (ref 150–440)
RBC: 4.23 MIL/uL — ABNORMAL LOW (ref 4.40–5.90)
RDW: 18.3 % — ABNORMAL HIGH (ref 11.5–14.5)
WBC: 10.9 K/uL — ABNORMAL HIGH (ref 3.8–10.6)

## 2016-12-04 MED ORDER — ADULT MULTIVITAMIN W/MINERALS CH
1.0000 | ORAL_TABLET | Freq: Every day | ORAL | Status: DC
  Administered 2016-12-04 – 2016-12-07 (×4): 1 via ORAL
  Filled 2016-12-04 (×4): qty 1

## 2016-12-04 MED ORDER — POLYETHYLENE GLYCOL 3350 17 G PO PACK
17.0000 g | PACK | Freq: Every day | ORAL | Status: DC
  Administered 2016-12-04 – 2016-12-07 (×4): 17 g via ORAL
  Filled 2016-12-04 (×4): qty 1

## 2016-12-04 MED ORDER — POLYMYXIN B-TRIMETHOPRIM 10000-0.1 UNIT/ML-% OP SOLN
1.0000 [drp] | OPHTHALMIC | Status: DC
  Administered 2016-12-04 – 2016-12-07 (×19): 1 [drp] via OPHTHALMIC
  Filled 2016-12-04 (×2): qty 10

## 2016-12-04 MED ORDER — ENSURE ENLIVE PO LIQD
237.0000 mL | Freq: Three times a day (TID) | ORAL | Status: DC
  Administered 2016-12-05 – 2016-12-07 (×7): 237 mL via ORAL

## 2016-12-04 MED ORDER — BISACODYL 5 MG PO TBEC
5.0000 mg | DELAYED_RELEASE_TABLET | Freq: Every day | ORAL | Status: DC | PRN
Start: 2016-12-04 — End: 2016-12-07

## 2016-12-04 NOTE — Clinical Social Work Note (Signed)
Clinical Social Work Assessment  Patient Details  Name: Kurt Hill MRN: [id number] Date of Birth: [birth year]  Date of referral:  12/04/16               Reason for consult:  Other (Comment Required) (From [facility] )                Permission sought to share information with:  Facility Art therapist granted to share information::  Yes, Verbal Permission Granted  Name::        Agency::     Relationship::     Contact Information:     Housing/Transportation Living arrangements for the past 2 months:  [place], [place] of Information:  Patient Patient Interpreter Needed:  None Criminal Activity/Legal Involvement Pertinent to Current Situation/Hospitalization:  No - Comment as needed Significant Relationships:  Adult Children, Other Family Members Lives with:  Self, Facility Resident Do you feel safe going back to the place where you live?  Yes Need for family participation in patient care:  Yes (Comment)  Care giving concerns:  Patient is from [facility], where he was placed on 10/21/16 for short term rehab.    Social Worker assessment / plan:  Holiday representative ([place]) received verbal consult from Chief Strategy Officer that patient is from [facility]. Per [facility] admissions coordinator at [facility] patient is a short term rehab resident and can return when stable. CSW made [facility] aware that patient may require IV abx. CSW met with patient and his sister in law Kurt Hill was at bedside. CSW is familiar with patient from previous admissions. Patient reported that he would like to return to [facility] and continue rehab. FL2 complete and faxed out. CSW will continue to follow and assist as needed.   Employment status:  Retired Forensic scientist:  Medicare PT Recommendations:  [place] / Referral to community resources:  [place]  Patient/Family's Response to care:  Patient  is agreeable to return to [facility].   Patient/Family's Understanding of and Emotional Response to Diagnosis, Current Treatment, and Prognosis:  Patient was pleasant and thanked CSW for visit.   Emotional Assessment Appearance:  Appears stated age Attitude/Demeanor/Rapport:    Affect (typically observed):  Accepting, Adaptable Orientation:  Oriented to Self, Oriented to Place, Oriented to  Time, Oriented to Situation Alcohol / Substance use:  Not Applicable Psych involvement (Current and /or in the community):  No (Comment)  Discharge Needs  Concerns to be addressed:  Discharge Planning Concerns Readmission within the last 30 days:  No Current discharge risk:  Dependent with Mobility Barriers to Discharge:  Continued Medical Work up   [employer], [name], LCSW 12/04/2016, 4:45 PM

## 2016-12-04 NOTE — Consult Note (Signed)
[place] Nurse wound consult note Reason for Consult:MD has removed exposed wire in left knee.  Awaiting orthopedic consult.  LEft heel with stage 3 pressure injury.  Ruddy red wound bed. Will begin absorptive dressing (calcium alginate).  LEft heel is ofloaded in a Actor.  Wound type:Nonhealing stage 3 pressure injury to left heel Left knee nonhealing surgical wound, orthopedic consult pending.  Dressing in place.   Pressure Ulcer POA: Yes Measurement:Left heel 3 cm x 1.2 cm x 0.3 cm  Wound [id number] red Drainage (amount, consistency, odor) Minimal serosanguinous  No odor.  Periwound:Intact.  Erythema to left lower leg.  Dressing procedure/placement/frequency:Cleanse left heel with NS and pat gently dry.  Apply calcium alginate to wound bed.  Cover with 4x4 gauze and kerlix.  Change daily and PRN soilage. Keep PRevalon boot on while in bed.  Will not follow at this time.  Please re-consult if needed.  Domenic Moras RN BSN [place] Pager [phone]

## 2016-12-04 NOTE — Progress Notes (Signed)
[facility] NAME: Kurt Hill    MRN#:  [id number]  [place] OF BIRTH:  February 14, 1932  SUBJECTIVE:  Hospital Day: 1 day Kurt Hill is a 82 y.o. male presenting with Fatigue .   Overnight events: no acute overnight events,  Interval Events: still complains of nausea, feels like belly is distended  REVIEW OF SYSTEMS:  CONSTITUTIONAL: No fever,positive  fatigue or weakness.  EYES: No blurred or double vision.  EARS, NOSE, AND THROAT: No tinnitus or ear pain.  RESPIRATORY: No cough, shortness of breath, wheezing or hemoptysis.  CARDIOVASCULAR: No chest pain, orthopnea, edema.  GASTROINTESTINAL: positive nausea, vomiting, denies diarrhea or abdominal pain.  GENITOURINARY: No dysuria, hematuria.  ENDOCRINE: No polyuria, nocturia,  HEMATOLOGY: No anemia, easy bruising or bleeding SKIN: No rash or lesion. MUSCULOSKELETAL: No joint pain or arthritis.   NEUROLOGIC: No tingling, numbness, weakness.  PSYCHIATRY: No anxiety or depression.   DRUG ALLERGIES:   Allergies  Allergen Reactions  . Ivp Dye [Iodinated Diagnostic Agents] Itching and Rash  . Sulfa Antibiotics Rash    VITALS:  Blood pressure (!) 147/72, pulse 75, temperature 97.9 F (36.6 C), temperature source Oral, resp. rate 16, height 5\' 10"  (1.778 m), weight 65.2 kg (143 lb 12.8 oz), SpO2 96 %.  PHYSICAL EXAMINATION:  VITAL SIGNS: Vitals:   12/04/16 0458 12/04/16 0758  BP: 128/76 (!) 147/72  Pulse: 78 75  Resp: 18 16  Temp: 97.5 F (36.4 C) 97.9 F (36.6 C)   GENERAL:82 y.o.male currently in no acute distress. Disheveled  HEAD: Normocephalic, atraumatic.  EYES: Pupils equal, round, reactive to light. Extraocular muscles intact. No scleral icterus. Bilateral conjunctivitis MOUTH: Moist mucosal membrane. Dentition intact. No abscess noted.  EAR, NOSE, THROAT: Clear without exudates. No external lesions.  NECK: Supple. No thyromegaly. No nodules. No JVD.   PULMONARY: Clear to ascultation, without wheeze rails or rhonci. No use of accessory muscles, Good respiratory effort. good air entry bilaterally CHEST: Nontender to palpation.  CARDIOVASCULAR: S1 and S2. Regular rate and rhythm. No murmurs, rubs, or gallops. No edema. Pedal pulses 2+ bilaterally.  GASTROINTESTINAL: Soft, nontender, nondistended. No masses. Hypoactive bowel sounds. No hepatosplenomegaly.  MUSCULOSKELETAL: No swelling, clubbing, or edema. Range of motion full in all extremities.  NEUROLOGIC: Cranial nerves II through XII are intact. No gross focal neurological deficits. Sensation intact. Reflexes intact.  SKIN: external wire, left knee, left heel wound - weeping, No ulceration, lesions, rashes, or cyanosis. Skin warm and dry. Turgor intact.  PSYCHIATRIC: Mood, affect within normal limits. The patient is awake, alert and oriented x 3. Insight, judgment intact.      LABORATORY PANEL:   CBC  Recent Labs Lab 12/04/16 0414  WBC 10.9*  HGB 11.8*  HCT 36.7*  PLT 442*   ------------------------------------------------------------------------------------------------------------------  Chemistries   Recent Labs Lab 11/28/16 1754  12/04/16 0414  NA 134*  < > 138  K 4.5  < > 4.1  CL 98*  < > 101  CO2 31  < > 30  GLUCOSE 95  < > 95  BUN 26*  < > 26*  CREATININE 1.00  < > 1.04  CALCIUM 8.9  < > 10.7*  AST 26  --   --   ALT 16*  --   --   ALKPHOS 86  --   --   BILITOT 0.2*  --   --   < > = values in this interval not displayed. ------------------------------------------------------------------------------------------------------------------  Cardiac  Enzymes  Recent Labs Lab 12/03/16 1055  TROPONINI <0.03   ------------------------------------------------------------------------------------------------------------------  RADIOLOGY:  Dg Chest Port 1 View  Result Date: 12/03/2016 CLINICAL DATA:  Altered mental status EXAM: PORTABLE CHEST 1 VIEW COMPARISON:   November 28, 2016 FINDINGS: There is no edema or consolidation. The heart size and pulmonary vascularity are normal. No adenopathy. No bone lesions. IMPRESSION: No edema or consolidation. Electronically Signed   By: Lowella Grip III M.D.   On: 12/03/2016 12:20    EKG:   Orders placed or performed during the hospital encounter of 12/03/16  . EKG 12-Lead  . EKG 12-Lead  . EKG 12-Lead  . EKG 12-Lead    ASSESSMENT AND PLAN:   Kurt Hill is a 82 y.o. male presenting with Fatigue . Admitted 12/03/2016 : Day #: 1 day  1. Cellulitis/wound infection: Left heel, left patella: Vancomycin/Zosyn and wound cultures blood cultures follow culture data, wound consult, orthopedic consult, check MRI to rule out osteomyelitis May need podiatry's involvement if positive 2. Constipation: hypoactive bowel movement - bowel regiment, check flat plate 3. Conjunctivitis: start eye drops 4. Hypothyroidism unspecified: Synthroid 5. COPD unspecified: Continue home medications 6. BPH: Proscar 7. Venous thromboembolism prophylactic: Lovenox   All the records are reviewed and case discussed with Care Management/Social Workerr. Management plans discussed with the patient, family and they are in agreement.  CODE STATUS: full TOTAL TIME TAKING CARE OF THIS PATIENT: 33 minutes.   POSSIBLE D/C IN 2-3DAYS, DEPENDING ON CLINICAL CONDITION.   Hower,  Karenann Cai.D on 12/04/2016 at 8:50 AM  Between 7am to 6pm - Pager - [phone]  After 6pm: House Pager: - [place] [facility]  Office  [phone]  CC: Primary care physician; Tracie Harrier, MD

## 2016-12-04 NOTE — Progress Notes (Signed)
Initial Nutrition Assessment  DOCUMENTATION CODES:   Severe malnutrition in context of chronic illness  INTERVENTION:   Ensure Enlive po TID, each supplement provides 350 kcal and 20 grams of protein  Magic cup TID with meals, each supplement provides 290 kcal and 9 grams of protein  MVI daily   snacks  NUTRITION DIAGNOSIS:   Malnutrition related to chronic illness, poor appetite as evidenced by severe depletion of body fat, severe depletion of muscle mass, 11 percent weight loss in 6 weeks.   GOAL:   Patient will meet greater than or equal to 90% of their needs  MONITOR:   PO intake, Supplement acceptance  REASON FOR ASSESSMENT:   Malnutrition Screening Tool    ASSESSMENT:   82 y.o. male with a known history of Hypotension on midodrine, failure left patellar surgery who is presenting from his nursing facility for "declining health"  Met with pt in room today. Pt reports poor appetite and oral intake for 2 months pta. Pt currently eating <25% meals. Pt has lost 18lbs(11%) over the past 6 weeks. This is severe. Pt reports chronic N/V/ constipation. Pt reports that he hasn't had a normal bowel movement in 3 weeks. Pt reports difficulty chewing meats and swallowing. SLP eval completed today; pt downgraded to a MS diet. Will order supplements and snacks.    Medications reviewed and include: aspirin, lovenox, synthroid, megace, reglan, zosyn, miralax, senokot, vancomycin, oxycodone  Labs reviewed: BUN 26(H), Ca 10.7(H) Wbc- 10.9(H)  Nutrition-Focused physical exam completed. Findings are severe fat depletion, severe muscle depletion, and no edema.   Diet Order:  DIET DYS 3 Room service appropriate? Yes; Fluid consistency: Thin  Skin:  Wound (see comment) (knee incision, heel wound)  Last BM:  12/26  Height:   Ht Readings from Last 1 Encounters:  12/03/16 _0  (1.778 m)    Weight:   Wt Readings from Last 1 Encounters:  12/03/16 143 lb 12.8 oz (65.2 kg)     Ideal Body Weight:  75.4 kg  BMI:  Body mass index is 20.63 kg/m.  Estimated Nutritional Needs:   Kcal:  2000-2300kcal/day   Protein:  91-104g/day   Fluid:  >2L/day   EDUCATION NEEDS:   No education needs identified at this time  Koleen Distance, RD, LDN Pager #[phone]

## 2016-12-04 NOTE — Evaluation (Signed)
Clinical/Bedside Swallow Evaluation Patient Details  Name: Kurt Hill MRN: [id number] Date of Birth: Nov 03, 1932  Today's Date: 12/04/2016 Time: SLP Start Time (ACUTE ONLY): 1225 SLP Stop Time (ACUTE ONLY): 1300 SLP Time Calculation (min) (ACUTE ONLY): 35 min  Past Medical History:  Past Medical History:  Diagnosis Date  . Anemia   . Asthma   . BPH (benign prostatic hyperplasia)   . COPD (chronic obstructive pulmonary disease) (HCC)    not on home oxygen  . Coronary artery disease   . Hypotension   . Hypothyroidism   . Skin abnormalities   . Skin cancer    Past Surgical History:  Past Surgical History:  Procedure Laterality Date  . APPENDECTOMY    . BONE RESECTION     on nose  . MOHS SURGERY    . ORIF PATELLA Left 07/30/2016   Procedure: OPEN REDUCTION INTERNAL (ORIF) FIXATION PATELLA;  Surgeon: Hessie Knows, MD;  Location: [facility] ORS;  Service: Orthopedics;  Laterality: Left;  . ORIF PATELLA Left 08/15/2016   Procedure: OPEN REDUCTION INTERNAL (ORIF) FIXATION PATELLA;  Surgeon: Hessie Knows, MD;  Location: [facility] ORS;  Service: Orthopedics;  Laterality: Left;  . ORIF PATELLA Left 10/14/2016   Procedure: OPEN REDUCTION INTERNAL (ORIF) FIXATION PATELLA;  Surgeon: Hessie Knows, MD;  Location: [facility] ORS;  Service: Orthopedics;  Laterality: Left;  . TONSILLECTOMY     HPI:  Kurt Hill  is a 82 y.o. male with a known history of Hypotension on midodrine, failure left patellar surgery who is presenting from his nursing facility for "declining health". Nsg reported that pt was choking when she was giving medications this morning.    Assessment / Plan / Recommendation Clinical Impression  Pt presents w/mild oral dysphagia c/b impaired mastication d/t pt edentulous. Pt given trials of ice chips, thin, puree and solid. No overt s/s of aspiration were observed w/any tested consistency. Vocal quality remained clear throughout and laryngeal elevation appeared adequate. Pt demonstrated impaired  mastication with solid consistency d/t pt is edentulous. Pt was able to clear with a f/u tsp of puree. Pt stated that he eats "soft" food at home. Pt is judged to be mild risk of aspiration d/t oral phase deficits. Recommend downgrading diet to Dysphagia III w/thin liquids and aspiration precuations, including meds whole in puree. Discussed w/nsg and pt. Will f/u in 1-2 days.     Aspiration Risk  Mild aspiration risk    Diet Recommendation Dysphagia 3 (Mech soft);Thin liquid   Liquid Administration via: Straw;Cup Medication Administration: Whole meds with puree Supervision: Staff to assist with self feeding Compensations: Minimize environmental distractions;Slow rate;Small sips/bites Postural Changes: Seated upright at 90 degrees    Other  Recommendations Oral Care Recommendations: Oral care BID;Staff/trained caregiver to provide oral care   Follow up Recommendations Skilled Nursing facility (TBD)      Frequency and Duration min 2x/week  1 week       Prognosis Prognosis for Safe Diet Advancement: Stamps Date of Onset: 12/04/16 HPI: Kurt Hill  is a 82 y.o. male with a known history of Hypotension on midodrine, failure left patellar surgery who is presenting from his nursing facility for "declining health". Nsg reported that pt was choking when she was giving medications this morning.  Type of Study: Bedside Swallow Evaluation Previous Swallow Assessment: none Diet Prior to this Study: Regular;Thin liquids Temperature Spikes Noted: No Respiratory Status: Room air History of Recent Intubation: No Behavior/Cognition: Alert;Cooperative Oral  Cavity Assessment: Within Functional Limits Oral Care Completed by SLP: No Oral Cavity - Dentition: Edentulous Vision: Functional for self-feeding Self-Feeding Abilities: Needs assist Patient Positioning: Upright in bed Baseline Vocal Quality: Normal Volitional Cough: Strong Volitional Swallow: Able to elicit     Oral/Motor/Sensory Function Overall Oral Motor/Sensory Function: Within functional limits   Ice Chips Ice chips: Within functional limits Presentation: Spoon   Thin Liquid Thin Liquid: Within functional limits Presentation: Cup;Straw    Nectar Thick Nectar Thick Liquid: Not tested   Honey Thick Honey Thick Liquid: Not tested   Puree Puree: Within functional limits Presentation: Spoon   Solid   GO   Solid: Impaired Oral Phase Impairments: Impaired mastication Oral Phase Functional Implications: Impaired mastication        [place],Maaran 12/04/2016,2:47 PM

## 2016-12-04 NOTE — Progress Notes (Signed)
Exposed wire removed at bedside without difficulty or pain.

## 2016-12-04 NOTE — Evaluation (Signed)
Physical Therapy Evaluation Patient Details Name: Kurt Hill MRN: [id number] DOB: [birth year] Today's Date: 12/04/2016   History of Present Illness  Pt is a 82 y.o. male with a known history of Hypotension on midodrine.  Pt has a history of multiple ORIF surgeries of left patella including 07/30/16, 08/15/16, and 10/14/16.  Pt is presenting from his nursing facility for declining health.    Clinical Impression  Pt presents with deficits in strength, transfers, mobility, gait, balance, and activity tolerance.  Pt required mod A with bed mobility tasks and tolerated sitting at EOB for several minutes as well as gentle seated therex.  Pt refused attempts to stand, however, mostly secondary to general fatigue but also secondary to LLE weakness and fear of pain with standing.  Offered to get a +2 assist to help pt to stand with increased assistance but pt became agitated and again refused.  Pt will benefit from PT services to address above deficits for decreased caregiver assistance upon discharge.      Follow Up Recommendations SNF    Equipment Recommendations  None recommended by PT    Recommendations for Other Services       Precautions / Restrictions Precautions Precautions: Fall Restrictions Weight Bearing Restrictions: Yes LLE Weight Bearing: Weight bearing as tolerated Other Position/Activity Restrictions: Per Dr. Rudene Christians, pt has worn a KI brace in the past but is not required to wear it at this time, may use brace PRN. Pt is WBAT to LLE.  Dr. Rudene Christians also reported that Dr. Elvina Mattes informed him pt is WBAT on LLE related to L heel wound.        Mobility  Bed Mobility Overal bed mobility: Needs Assistance Bed Mobility: Supine to Sit;Sit to Supine     Supine to sit: Mod assist Sit to supine: Mod assist   General bed mobility comments: Mod verbal cues for sequencing  Transfers                 General transfer comment: Pt refused attempt to stand secondary to LLE  pain/weakness and general fatigue  Ambulation/Gait             General Gait Details: Pt refused attempt to ambulate secondary to LLE pain/weakness and general fatigue  Stairs            Wheelchair Mobility    Modified Rankin (Stroke Patients Only)       Balance Overall balance assessment: Needs assistance Sitting-balance support: Bilateral upper extremity supported Sitting balance-Leahy Scale: Fair                                       Pertinent Vitals/Pain Pain Assessment: 0-10 Pain Score: 1  Pain Location: L knee Pain Descriptors / Indicators: Sore Pain Intervention(s): Monitored during session    Home Living Family/patient expects to be discharged to:: Skilled nursing facility                      Prior Function Level of Independence: Independent         Comments: Pt reports PLOF as ind with amb without AD until first L patella ORIF at which time started using RW.     Hand Dominance   Dominant Hand: Right    Extremity/Trunk Assessment        Lower Extremity Assessment Lower Extremity Assessment: Generalized weakness;LLE deficits/detail LLE Deficits / Details: Pt guarded with  attempts to move LLE LLE: Unable to fully assess due to pain       Communication   Communication: No difficulties  Cognition Arousal/Alertness: Awake/alert Behavior During Therapy: WFL for tasks assessed/performed Overall Cognitive Status: Within Functional Limits for tasks assessed                      General Comments      Exercises Total Joint Exercises Ankle Circles/Pumps: Strengthening;Both;10 reps Quad Sets: AROM;Both;10 reps Gluteal Sets: AROM;Both;10 reps Short Arc Quad: Strengthening;Right;10 reps Hip ABduction/ADduction: AAROM;Both;10 reps Straight Leg Raises: AAROM;Both;10 reps Long Arc Quad: AAROM;Left;10 reps Knee Flexion: AROM;Both;10 reps   Assessment/Plan    PT Assessment Patient needs continued PT  services  PT Problem List Decreased strength;Decreased activity tolerance;Decreased balance;Decreased mobility          PT Treatment Interventions DME instruction;Gait training;Functional mobility training;Therapeutic activities;Therapeutic exercise;Balance training;Neuromuscular re-education;Patient/family education    PT Goals (Current goals can be found in the Care Plan section)  Acute Rehab PT Goals Patient Stated Goal: To walk better PT Goal Formulation: With patient Time For Goal Achievement: 12/17/16 Potential to Achieve Goals: Fair    Frequency Min 2X/week   Barriers to discharge        Co-evaluation               End of Session   Activity Tolerance: Patient limited by fatigue;Patient limited by pain Patient left: in bed;with bed alarm set;with call bell/phone within reach (Prevalon boots donned to BLEs)           Time: [id number] PT Time Calculation (min) (ACUTE ONLY): 22 min   Charges:   PT Evaluation $PT Eval Low Complexity: 1 Procedure PT Treatments $Therapeutic Exercise: 8-22 mins   PT G Codes:        DRoyetta Asal PT, DPT 12/04/16, 4:14 PM

## 2016-12-04 NOTE — Progress Notes (Signed)
Got report from [place], Therapist, sports.  Patient resting comfortably.

## 2016-12-04 NOTE — NC FL2 (Signed)
[place] LEVEL OF CARE SCREENING TOOL     IDENTIFICATION  Patient Name: Kurt Hill Birthdate: [birth year] Sex: male Admission Date (Current Location): 12/03/2016  [place] and [place] Number:  Engineering geologist and Address:  [facility], [address] [place], [place] [address]      Provider Number: [id number]  Attending Physician Name and Address:  Lytle Butte, MD  Relative Name and Phone Number:       Current Level of Care: Hospital Recommended Level of Care: [place] Prior Approval Number:    Date Approved/Denied:   PASRR Number:  ([id number])  Discharge Plan: SNF    Current Diagnoses: Patient Active Problem List   Diagnosis Date Noted  . Pressure injury of skin 10/13/2016  . Cellulitis of left leg 08/30/2016  . Post op infection 08/30/2016  . Cellulitis 08/30/2016  . Left patella fracture 08/15/2016  . Patella fracture 07/30/2016  . COPD exacerbation ([place]) 12/14/2015    Orientation RESPIRATION BLADDER Height & Weight     Self, Time, Situation, Place  Normal Incontinent Weight: 143 lb 12.8 oz (65.2 kg) Height:  5\' 10"  (177.8 cm)  BEHAVIORAL SYMPTOMS/MOOD NEUROLOGICAL BOWEL NUTRITION STATUS   (none)  (none) Incontinent Diet (Dysphagia 3 (Mech soft);Thin liquid )  AMBULATORY STATUS COMMUNICATION OF NEEDS Skin   Extensive Assist Verbally PU Stage and Appropriate Care, Surgical wounds (Pressure Ulcer Left Heel & infected left knee surgical site. )                       Personal Care Assistance Level of Assistance  Bathing, Feeding, Dressing Bathing Assistance: Limited assistance Feeding assistance: Independent Dressing Assistance: Limited assistance     Functional Limitations Info  Sight, Hearing, Speech Sight Info: Adequate Hearing Info: Adequate Speech Info: Adequate    SPECIAL CARE FACTORS FREQUENCY  PT (By licensed PT), OT (By licensed OT)     PT Frequency:  (5) OT Frequency:   (5)            Contractures      Additional Factors Info  Code Status, Allergies, Isolation Precautions Code Status Info:  (Full Code. ) Allergies Info:  (Ivp Dye Iodinated Diagnostic Agents, Sulfa Antibiotics)     Isolation Precautions Info:  (MRSA )     Current Medications (12/04/2016):  This is the current hospital active medication list Current Facility-Administered Medications  Medication Dose Route Frequency Provider Last Rate Last Dose  . 0.9 %  sodium chloride infusion   Intravenous Continuous Lytle Butte, MD 75 mL/hr at 12/04/16 0316 75 mL/hr at 12/04/16 0316  . acetaminophen (TYLENOL) tablet 650 mg  650 mg Oral Q6H PRN Lytle Butte, MD       Or  . acetaminophen (TYLENOL) suppository 650 mg  650 mg Rectal Q6H PRN Lytle Butte, MD      . aspirin EC tablet 81 mg  81 mg Oral q morning - 10a Lytle Butte, MD   81 mg at 12/04/16 1012  . bisacodyl (DULCOLAX) EC tablet 5 mg  5 mg Oral Daily PRN Lytle Butte, MD      . enoxaparin (LOVENOX) injection 40 mg  40 mg Subcutaneous Q24H Lytle Butte, MD   40 mg at 12/03/16 2113  . feeding supplement (ENSURE ENLIVE) (ENSURE ENLIVE) liquid 237 mL  237 mL Oral TID BM Lytle Butte, MD      . finasteride (PROSCAR) tablet 5 mg  5 mg Oral q morning - 10a Lytle Butte, MD   5 mg at 12/04/16 1012  . fludrocortisone (FLORINEF) tablet 0.1 mg  0.1 mg Oral Daily Lytle Butte, MD   0.1 mg at 12/04/16 1012  . levothyroxine (SYNTHROID, LEVOTHROID) tablet 100 mcg  100 mcg Oral QAC breakfast Lytle Butte, MD   100 mcg at 12/04/16 0741  . megestrol (MEGACE) tablet 40 mg  40 mg Oral Daily Lytle Butte, MD   40 mg at 12/04/16 1012  . metoCLOPramide (REGLAN) tablet 10 mg  10 mg Oral TID AC & HS Lytle Butte, MD   10 mg at 12/04/16 1123  . midodrine (PROAMATINE) tablet 10 mg  10 mg Oral TID WC Lytle Butte, MD   10 mg at 12/04/16 1123  . mometasone-formoterol (DULERA) 100-5 MCG/ACT inhaler 2 puff  2 puff Inhalation BID Lytle Butte, MD   2 puff  at 12/04/16 0741  . multivitamin with minerals tablet 1 tablet  1 tablet Oral Daily Lytle Butte, MD      . ondansetron [facility]) tablet 4 mg  4 mg Oral Q6H PRN Lytle Butte, MD       Or  . ondansetron [facility]) injection 4 mg  4 mg Intravenous Q6H PRN Lytle Butte, MD      . oxyCODONE (Oxy IR/ROXICODONE) immediate release tablet 5 mg  5 mg Oral Q4H PRN Lytle Butte, MD   5 mg at 12/04/16 1049  . piperacillin-tazobactam (ZOSYN) IVPB 3.375 g  3.375 g Intravenous [address] [place], RPH   3.375 g at 12/04/16 1424  . polyethylene glycol (MIRALAX / GLYCOLAX) packet 17 g  17 g Oral Daily Lytle Butte, MD   17 g at 12/04/16 1011  . sennosides-docusate sodium (SENOKOT-S) 8.6-50 MG tablet 2 tablet  2 tablet Oral BID Lytle Butte, MD   2 tablet at 12/04/16 1012  . sertraline (ZOLOFT) tablet 25 mg  25 mg Oral Daily Lytle Butte, MD   25 mg at 12/04/16 1035  . trimethoprim-polymyxin b (POLYTRIM) ophthalmic solution 1 drop  1 drop Both Eyes Q4H Lytle Butte, MD   1 drop at 12/04/16 1135  . vancomycin (VANCOCIN) IVPB 1000 mg/200 mL premix  1,000 mg Intravenous Q18H Lenis Noon, RPH 200 mL/hr at 12/03/16 2113 1,000 mg at 12/03/16 2113     Discharge Medications: Please see discharge summary for a list of discharge medications.  Relevant Imaging Results:  Relevant Lab Results:   Additional Information  (SSN: [id number] (my need IV Abx))  Kurt Hill, [name], LCSW

## 2016-12-04 NOTE — Progress Notes (Signed)
Patient c/o full bladder and unable to relieve himself.  Bladder scan shows >945ml.  Sudini paged and ordered in and out cath and repeat bladder scan in 6hrs.

## 2016-12-05 MED ORDER — FLEET ENEMA 7-19 GM/118ML RE ENEM
1.0000 | ENEMA | Freq: Once | RECTAL | Status: AC
  Administered 2016-12-05: 1 via RECTAL

## 2016-12-05 NOTE — Consult Note (Signed)
Patient Demographics  Kurt Hill, is a 82 y.o. male   MRN: [id number]   DOB - 06-Sep-1932  Admit Date - 12/03/2016    Outpatient Primary MD for the patient is Tracie Harrier, MD  Consult requested in the Hospital by Lytle Butte, MD, On 12/05/2016    Reason for consult left decubitus heel ulcer with possible osteomyelitis   With History of -  Past Medical History:  Diagnosis Date  . Anemia   . Asthma   . BPH (benign prostatic hyperplasia)   . COPD (chronic obstructive pulmonary disease) (HCC)    not on home oxygen  . Coronary artery disease   . Hypotension   . Hypothyroidism   . Skin abnormalities   . Skin cancer       Past Surgical History:  Procedure Laterality Date  . APPENDECTOMY    . BONE RESECTION     on nose  . MOHS SURGERY    . ORIF PATELLA Left 07/30/2016   Procedure: OPEN REDUCTION INTERNAL (ORIF) FIXATION PATELLA;  Surgeon: Hessie Knows, MD;  Location: [facility] ORS;  Service: Orthopedics;  Laterality: Left;  . ORIF PATELLA Left 08/15/2016   Procedure: OPEN REDUCTION INTERNAL (ORIF) FIXATION PATELLA;  Surgeon: Hessie Knows, MD;  Location: [facility] ORS;  Service: Orthopedics;  Laterality: Left;  . ORIF PATELLA Left 10/14/2016   Procedure: OPEN REDUCTION INTERNAL (ORIF) FIXATION PATELLA;  Surgeon: Hessie Knows, MD;  Location: [facility] ORS;  Service: Orthopedics;  Laterality: Left;  . TONSILLECTOMY      in for   Chief Complaint  Patient presents with  . Fatigue     HPI  Kurt Hill  is a 82 y.o. male, He is a patient known to me because of the decubitus heel ulcer that developed on his left heel. He also had recurrent knee issues and lack of healing to a left knee injury and fracture. I been following him in the office where he has had serial debridements of the heel as well as use of a wound VAC.  Wound VAC has been helping the area granulating and quite a bit. Patient was hospitalized several days ago due to fatigue, lethargy, and concerns about infection in his knee and his heel  Review of Systems : He is alert and responsive to questions. He states his foot hurt some but is not too bad.   In addition to the HPI above,  No Fever-chills, No Headache, No changes with Vision or hearing,  No Chest pain, Cough or Shortness of Breath, No Abdominal pain, No Nausea or Vommitting, Bowel movements are regular, No Blood in stool or Urine, No dysuria, Left heel decubitus ulcer. Left knee dehiscence from injury and surgery. Blister formations on the lower extremities anterior shin area due to edema  Chronic weakness to the lower extremity left,  Has had gradual decrease in weight and general functions over the last couple of months.  Social History Social History  Substance Use Topics  . Smoking status: Never Smoker  . Smokeless tobacco: Never Used     Comment: tried cigarettes when young- but never really smoked  . Alcohol use No     Family History Family History  Problem Relation Age  of Onset  . CAD Other      Prior to Admission medications   Medication Sig Start Date End Date Taking? Authorizing Provider  acetaminophen (TYLENOL) 325 MG tablet Take by mouth every 4 (four) hours as needed.   Yes Historical Provider, MD  albuterol (PROAIR HFA) 108 (90 Base) MCG/ACT inhaler Inhale 2 puffs into the lungs every 6 (six) hours as needed for wheezing or shortness of breath.  12/06/15 12/05/16 Yes Historical Provider, MD  albuterol (PROVENTIL) (2.5 MG/3ML) 0.083% nebulizer solution Take 2.5 mg by nebulization every 6 (six) hours as needed for wheezing or shortness of breath.   Yes Historical Provider, MD  alum & mag hydroxide-simeth (MYLANTA) 200-200-20 MG/5ML suspension Take 30 mLs by mouth every 6 (six) hours as needed for indigestion or heartburn.   Yes Historical Provider, MD  aspirin  EC 81 MG tablet Take 81 mg by mouth every morning.   Yes Historical Provider, MD  budesonide-formoterol (SYMBICORT) 80-4.5 MCG/ACT inhaler Inhale 2 puffs into the lungs 2 (two) times daily.   Yes Historical Provider, MD  finasteride (PROSCAR) 5 MG tablet Take 5 mg by mouth every morning. 03/12/16 03/12/17 Yes Historical Provider, MD  fludrocortisone (FLORINEF) 0.1 MG tablet Take 0.1 mg by mouth daily.   Yes Historical Provider, MD  lactulose (CHRONULAC) 10 GM/15ML solution Take 45 mLs (30 g total) by mouth daily as needed for moderate constipation. 09/17/16  Yes Paulette Blanch, MD  levothyroxine (SYNTHROID, LEVOTHROID) 100 MCG tablet Take 1 tablet by mouth daily. 09/04/15  Yes Historical Provider, MD  megestrol (MEGACE) 40 MG tablet Take 40 mg by mouth daily.   Yes Historical Provider, MD  metoCLOPramide (REGLAN) 10 MG tablet Take 10 mg by mouth 4 (four) times daily -  before meals and at bedtime.   Yes Historical Provider, MD  midodrine (PROAMATINE) 5 MG tablet Take 1 tablet (5 mg total) by mouth 3 (three) times daily with meals. Patient taking differently: Take 10 mg by mouth 3 (three) times daily with meals.  10/19/16  Yes Sital Mody, MD  ondansetron (ZOFRAN) 4 MG tablet Take 4 mg by mouth every 8 (eight) hours as needed for nausea or vomiting.   Yes Historical Provider, MD  oxyCODONE (OXY IR/ROXICODONE) 5 MG immediate release tablet Take 1 tablet (5 mg total) by mouth every 4 (four) hours as needed for moderate pain or severe pain. 10/19/16  Yes Bettey Costa, MD  polyethylene glycol (MIRALAX / GLYCOLAX) packet Take 17 g by mouth daily. 10/20/16  Yes Bettey Costa, MD  sennosides-docusate sodium (SENOKOT-S) 8.6-50 MG tablet Take 2 tablets by mouth 2 (two) times daily.   Yes Historical Provider, MD  sertraline (ZOLOFT) 25 MG tablet Take 25 mg by mouth daily.   Yes Historical Provider, MD  Vitamin D, Ergocalciferol, (DRISDOL) 50000 units CAPS capsule Take 1 capsule by mouth every 7 (seven) days. 09/04/15  Yes  Historical Provider, MD  collagenase (SANTYL) ointment Apply topically daily. Patient not taking: Reported on 12/03/2016 10/19/16   Bettey Costa, MD  feeding supplement, ENSURE ENLIVE, (ENSURE ENLIVE) LIQD Take 237 mLs by mouth 2 (two) times daily between meals. 10/19/16   Bettey Costa, MD  mupirocin ointment (BACTROBAN) 2 % Place 1 application into the nose 2 (two) times daily. Patient not taking: Reported on 12/03/2016 09/02/16   Duanne Guess, PA-C    Anti-infectives    Start     Dose/Rate Route Frequency Ordered Stop   12/03/16 2200  vancomycin (VANCOCIN) IVPB 1000 mg/200  mL premix     1,000 mg 200 mL/hr over 60 Minutes Intravenous Every 18 hours 12/03/16 1448     12/03/16 2200  piperacillin-tazobactam (ZOSYN) IVPB 3.375 g     3.375 g 12.5 mL/hr over 240 Minutes Intravenous Every 8 hours 12/03/16 1450     12/03/16 1415  vancomycin (VANCOCIN) IVPB 1000 mg/200 mL premix  Status:  Discontinued     1,000 mg 200 mL/hr over 60 Minutes Intravenous Every 24 hours 12/03/16 1402 12/03/16 1446   12/03/16 1245  piperacillin-tazobactam (ZOSYN) IVPB 3.375 g     3.375 g 100 mL/hr over 30 Minutes Intravenous  Once 12/03/16 1240 12/03/16 1330      Scheduled Meds: . aspirin EC  81 mg Oral q morning - 10a  . enoxaparin (LOVENOX) injection  40 mg Subcutaneous Q24H  . feeding supplement (ENSURE ENLIVE)  237 mL Oral TID BM  . finasteride  5 mg Oral q morning - 10a  . fludrocortisone  0.1 mg Oral Daily  . levothyroxine  100 mcg Oral QAC breakfast  . megestrol  40 mg Oral Daily  . metoCLOPramide  10 mg Oral TID AC & HS  . midodrine  10 mg Oral TID WC  . mometasone-formoterol  2 puff Inhalation BID  . multivitamin with minerals  1 tablet Oral Daily  . piperacillin-tazobactam (ZOSYN)  IV  3.375 g Intravenous Q8H  . polyethylene glycol  17 g Oral Daily  . sennosides-docusate sodium  2 tablet Oral BID  . sertraline  25 mg Oral Daily  . trimethoprim-polymyxin b  1 drop Both Eyes Q4H  . vancomycin   1,000 mg Intravenous Q18H   Continuous Infusions: . sodium chloride 75 mL/hr at 12/04/16 2242   PRN Meds:.acetaminophen **OR** acetaminophen, bisacodyl, ondansetron **OR** ondansetron (ZOFRAN) IV, oxyCODONE  Allergies  Allergen Reactions  . Ivp Dye [Iodinated Diagnostic Agents] Itching and Rash  . Sulfa Antibiotics Rash    Physical Exam  Vitals  Blood pressure 103/62, pulse 72, temperature 98 F (36.7 C), temperature source Oral, resp. rate 20, height 5\' 10"  (1.778 m), weight 65.2 kg (143 lb 12.8 oz), SpO2 98 %.  Lower Extremity exam:  Vascular:Difficult to palpate bilaterally  Dermatological: Patient has a decubitus ulcer on the left heel approximately 3.5 cm x 2.7 cm in width. Mostly granular but the inferior portion of the wound has some fibrotic tissue. There is no purulent drainage is serous drainage from the area at this timeframe. No significant surrounding cellulitis or redness is noted to the area. Does have blister formations along the anterior leg lower leg secondary to edema to the region. He has no strap around his knee and this is causing increased swelling distal to that.  Neurological: Some peripheral neuropathies likely noted.  Ortho: MRI showed some increased uptake in the calcaneus but it's somewhat of a weaker type signal and I don't see any distinct cortical erosion at this point.  Data Review  CBC  Recent Labs Lab 11/28/16 1754 12/03/16 1055 12/04/16 0414  WBC 8.2 13.6* 10.9*  HGB 9.6* 11.8* 11.8*  HCT 28.7* 36.3* 36.7*  PLT 376 562* 442*  MCV 84.5 85.0 86.7  MCH 28.1 27.5 27.9  MCHC 33.3 32.4 32.2  RDW 17.0* 18.1* 18.3*  LYMPHSABS 1.0  --   --   MONOABS 0.9  --   --   EOSABS 0.8*  --   --   BASOSABS 0.1  --   --    ------------------------------------------------------------------------------------------------------------------  Chemistries   Recent  Labs Lab 11/28/16 1754 12/03/16 1055 12/04/16 0414  NA 134* 136 138  K 4.5 4.1 4.1   CL 98* 97* 101  CO2 31 32 30  GLUCOSE 95 125* 95  BUN 26* 28* 26*  CREATININE 1.00 1.01 1.04  CALCIUM 8.9 11.1* 10.7*  AST 26  --   --   ALT 16*  --   --   ALKPHOS 86  --   --   BILITOT 0.2*  --   --    --------------------------------------------------------------------------------------------------------------  Imaging results:   Mr Foot Left Wo Contrast  Result Date: 12/04/2016 CLINICAL DATA:  Pressure all stairs on the left foot. EXAM: MRI OF THE LEFT FOOT WITHOUT CONTRAST TECHNIQUE: Multiplanar, multisequence MR imaging of the ankle was performed. No intravenous contrast was administered. COMPARISON:  None. FINDINGS: TENDONS Peroneal: Peroneal longus tendon intact. Peroneal brevis intact. Posteromedial: Posterior tibial tendon intact. Flexor hallucis longus tendon intact. Flexor digitorum longus tendon intact. Anterior: Tibialis anterior tendon intact. Extensor hallucis longus tendon intact Extensor digitorum longus tendon intact. Achilles:  Intact. Plantar Fascia: Intact. LIGAMENTS Lateral: Anterior talofibular ligament intact. Calcaneofibular ligament intact. Posterior talofibular ligament intact. Anterior and posterior tibiofibular ligaments intact. Medial: Deltoid ligament intact. Spring ligament intact. CARTILAGE Ankle Joint: No joint effusion. Normal ankle mortise. No chondral defect. Subtalar Joints/Sinus Tarsi: Normal subtalar joints. No subtalar joint effusion. Normal sinus tarsi. Bones and Soft Tissue: Relative pes cavus. Soft tissue ulcer along the posterolateral aspect of the calcaneus extending down to the bone. Underlying cortical destruction and bone marrow edema most consistent with osteomyelitis. No focal fluid collection to suggest an abscess. IMPRESSION: 1. Soft tissue ulcer overlying the posterolateral calcaneus extending down to the bone with underlying cortical destruction and bone marrow edema most consistent with posterior calcaneal osteomyelitis. No focal fluid  collection to suggest an abscess. Electronically Signed   By: Kathreen Devoid   On: 12/04/2016 14:46   Dg Abd Portable 1v  Result Date: 12/04/2016 CLINICAL DATA:  Evaluation for abdominal distention. Patient states he has not eaten for several days and has been constipation. EXAM: PORTABLE ABDOMEN - 1 VIEW COMPARISON:  01/18/2014 FINDINGS: There is mild distention of the right colon with increased air noted in the colon and small bowel, but no evidence of obstruction. Moderate stool is noted in the rectum and lower sigmoid with mild increased stool noted in the right colon. No gross soft tissue abnormality. There is a compression fracture of L1, which appears new since the prior study, but stable when compared to lumbar spine radiographs dated 07/31/2016. IMPRESSION: 1. No bowel obstruction or acute abnormality. 2. Increased bowel gas with mild distention of the right colon. Mild increased stool in the right colon and the rectum and lower sigmoid colon. Electronically Signed   By: Lajean Manes M.D.   On: 12/04/2016 09:30     Assessment & Plan: Chronic decubitus ulcer left heel is actually in better appearance now than it was 3 to 4 weeks ago. MRI does suggest possibility of osteomyelitis but does not have a frank cellulitis around the heel at this time there is no frank pus draining from the wound. I evaluated the MRI and do not see any obvious cortical erosion. There was some increased signal to the heel that was somewhat diffuse through the heel bone. I do not think he is a surgical candidate at this time. The wound appears to be fairly stable I would not recommend any surgery at this juncture. I would however recommend a follow-up  with Dr. Ola Spurr for some long-term antibiotic therapy to see if it's possibility of suppressing the infection may be present in the area also apply a wet-to-dry dressing to the heel along with a dry dressing to the leg is hold off for little bit more compression from the toes  to the wrap around his knee and synovectomy to the swelling and help soften blistering components to his leg. Suggest change his dressing daily..  Active Problems:   Cellulitis   Protein-calorie malnutrition, severe     Family Communication: Plan discussed with patient. Perry Mount M.D on 12/05/2016 at 1:40 PM  Thank you for the consult, we will follow the patient with you in the Hospital.  [id number] [facility]

## 2016-12-05 NOTE — Progress Notes (Signed)
Checked patient's available SNF days through [facility] automated system [phone]).  Per their records, as of the last billing date of 11/07/16, patient has 0 full SNF days and 46 co-SNF days available.

## 2016-12-05 NOTE — Progress Notes (Signed)
Per [facility] admissions coordinator at [facility] they will not be able to accept patient back. Clinical Education officer, museum (CSW) sent out new SNF search. CSW met with patient and made him aware of above. CSW presented bed offers. Patient chose [facility]. CSW made [facility] admissions coordinator at [facility] aware of accepted bed offer and that patient has 24 co-pay days left for SNF according to [facility] automated system. CSW will continue to follow and assist as needed.   [employer], LCSW [phone]

## 2016-12-05 NOTE — Progress Notes (Signed)
PT Cancellation Note  Patient Details Name: DRAZEN ECKERT MRN: [id number] DOB: June 09, 1932   Cancelled Treatment:    Reason Eval/Treat Not Completed: Fatigue/lethargy limiting ability to participate; Patient refused PT session this date secondary to fatigue that per pt is a result of poor PO intake.  Pt agitated with offer to attempt low intensity supine therex and again refused.  Will attempt PT treatment session at a future date.    Blanche [place] 12/05/2016, 12:38 PM

## 2016-12-05 NOTE — Progress Notes (Signed)
[place] [facility] [place] NAME: Kurt Hill    MRN#:  [id number]  [place] OF BIRTH:  01-28-1932  SUBJECTIVE:  Hospital Day: 2 days Kurt Hill is a 82 y.o. male presenting with Fatigue .   Overnight events: no acute overnight events,  Interval Events: still complains of nausea, feels like belly is distended  REVIEW OF SYSTEMS:  CONSTITUTIONAL: No fever,positive  fatigue or weakness.  EYES: No blurred or double vision.  EARS, NOSE, AND THROAT: No tinnitus or ear pain.  RESPIRATORY: No cough, shortness of breath, wheezing or hemoptysis.  CARDIOVASCULAR: No chest pain, orthopnea, edema.  GASTROINTESTINAL: positive nausea, vomiting, denies diarrhea or abdominal pain.  GENITOURINARY: No dysuria, hematuria.  ENDOCRINE: No polyuria, nocturia,  HEMATOLOGY: No anemia, easy bruising or bleeding SKIN: No rash or lesion. MUSCULOSKELETAL: No joint pain or arthritis.   NEUROLOGIC: No tingling, numbness, weakness.  PSYCHIATRY: No anxiety or depression.   DRUG ALLERGIES:   Allergies  Allergen Reactions  . Ivp Dye [Iodinated Diagnostic Agents] Itching and Rash  . Sulfa Antibiotics Rash    VITALS:  Blood pressure 103/62, pulse 72, temperature 98 F (36.7 C), temperature source Oral, resp. rate 20, height 5\' 10"  (1.778 m), weight 65.2 kg (143 lb 12.8 oz), SpO2 98 %.  PHYSICAL EXAMINATION:  VITAL SIGNS: Vitals:   12/05/16 0342 12/05/16 0737  BP: (!) 125/57 103/62  Pulse: 61 72  Resp: 18 20  Temp: 98.3 F (36.8 C) 98 F (36.7 C)   GENERAL:82 y.o.male currently in no acute distress. Disheveled  HEAD: Normocephalic, atraumatic.  EYES: Pupils equal, round, reactive to light. Extraocular muscles intact. No scleral icterus. Bilateral conjunctivitis MOUTH: Moist mucosal membrane. Dentition intact. No abscess noted.  EAR, NOSE, THROAT: Clear without exudates. No external lesions.  NECK: Supple. No thyromegaly. No nodules. No JVD.  PULMONARY:  Clear to ascultation, without wheeze rails or rhonci. No use of accessory muscles, Good respiratory effort. good air entry bilaterally CHEST: Nontender to palpation.  CARDIOVASCULAR: S1 and S2. Regular rate and rhythm. No murmurs, rubs, or gallops. No edema. Pedal pulses 2+ bilaterally.  GASTROINTESTINAL: Soft, nontender, nondistended. No masses. Hypoactive bowel sounds. No hepatosplenomegaly.  MUSCULOSKELETAL: No swelling, clubbing, or edema. Range of motion full in all extremities.  NEUROLOGIC: Cranial nerves II through XII are intact. No gross focal neurological deficits. Sensation intact. Reflexes intact.  SKIN: external wire, left knee, left heel wound - weeping, No ulceration, lesions, rashes, or cyanosis. Skin warm and dry. Turgor intact.  PSYCHIATRIC: Mood, affect within normal limits. The patient is awake, alert and oriented x 3. Insight, judgment intact.      LABORATORY PANEL:   CBC  Recent Labs Lab 12/04/16 0414  WBC 10.9*  HGB 11.8*  HCT 36.7*  PLT 442*   ------------------------------------------------------------------------------------------------------------------  Chemistries   Recent Labs Lab 11/28/16 1754  12/04/16 0414  NA 134*  < > 138  K 4.5  < > 4.1  CL 98*  < > 101  CO2 31  < > 30  GLUCOSE 95  < > 95  BUN 26*  < > 26*  CREATININE 1.00  < > 1.04  CALCIUM 8.9  < > 10.7*  AST 26  --   --   ALT 16*  --   --   ALKPHOS 86  --   --   BILITOT 0.2*  --   --   < > = values in this interval not displayed. ------------------------------------------------------------------------------------------------------------------  Cardiac Enzymes  Recent Labs Lab 12/03/16 1055  TROPONINI <0.03   ------------------------------------------------------------------------------------------------------------------  RADIOLOGY:  Mr Foot Left Wo Contrast  Result Date: 12/04/2016 CLINICAL DATA:  Pressure all stairs on the left foot. EXAM: MRI OF THE LEFT FOOT  WITHOUT CONTRAST TECHNIQUE: Multiplanar, multisequence MR imaging of the ankle was performed. No intravenous contrast was administered. COMPARISON:  None. FINDINGS: TENDONS Peroneal: Peroneal longus tendon intact. Peroneal brevis intact. Posteromedial: Posterior tibial tendon intact. Flexor hallucis longus tendon intact. Flexor digitorum longus tendon intact. Anterior: Tibialis anterior tendon intact. Extensor hallucis longus tendon intact Extensor digitorum longus tendon intact. Achilles:  Intact. Plantar Fascia: Intact. LIGAMENTS Lateral: Anterior talofibular ligament intact. Calcaneofibular ligament intact. Posterior talofibular ligament intact. Anterior and posterior tibiofibular ligaments intact. Medial: Deltoid ligament intact. Spring ligament intact. CARTILAGE Ankle Joint: No joint effusion. Normal ankle mortise. No chondral defect. Subtalar Joints/Sinus Tarsi: Normal subtalar joints. No subtalar joint effusion. Normal sinus tarsi. Bones and Soft Tissue: Relative pes cavus. Soft tissue ulcer along the posterolateral aspect of the calcaneus extending down to the bone. Underlying cortical destruction and bone marrow edema most consistent with osteomyelitis. No focal fluid collection to suggest an abscess. IMPRESSION: 1. Soft tissue ulcer overlying the posterolateral calcaneus extending down to the bone with underlying cortical destruction and bone marrow edema most consistent with posterior calcaneal osteomyelitis. No focal fluid collection to suggest an abscess. Electronically Signed   By: Kathreen Devoid   On: 12/04/2016 14:46   Dg Abd Portable 1v  Result Date: 12/04/2016 CLINICAL DATA:  Evaluation for abdominal distention. Patient states he has not eaten for several days and has been constipation. EXAM: PORTABLE ABDOMEN - 1 VIEW COMPARISON:  01/18/2014 FINDINGS: There is mild distention of the right colon with increased air noted in the colon and small bowel, but no evidence of obstruction. Moderate stool  is noted in the rectum and lower sigmoid with mild increased stool noted in the right colon. No gross soft tissue abnormality. There is a compression fracture of L1, which appears new since the prior study, but stable when compared to lumbar spine radiographs dated 07/31/2016. IMPRESSION: 1. No bowel obstruction or acute abnormality. 2. Increased bowel gas with mild distention of the right colon. Mild increased stool in the right colon and the rectum and lower sigmoid colon. Electronically Signed   By: Lajean Manes M.D.   On: 12/04/2016 09:30    EKG:   Orders placed or performed during the hospital encounter of 12/03/16  . EKG 12-Lead  . EKG 12-Lead  . EKG 12-Lead  . EKG 12-Lead  . EKG    ASSESSMENT AND PLAN:   Mosheh Eymard is a 82 y.o. male presenting with Fatigue . Admitted 12/03/2016 : Day #: 2 days  1. Osteomyelitis/Cellulitis/wound infection: Left heel, left patella: Vancomycin/Zosyn and wound cultures blood cultures follow culture data, wound consult, orthopedic consult, consult podiatry and infectious disease 2. Constipation: hypoactive bowel movement - bowel regiment, increased bowel regimen need enema 3. Conjunctivitis: eye drops 4. Hypothyroidism unspecified: Synthroid 5. COPD unspecified: Continue home medications 6. BPH: Proscar 7. Venous thromboembolism prophylactic: Lovenox   All the records are reviewed and case discussed with Care Management/Social Workerr. Management plans discussed with the patient, family and they are in agreement.  CODE STATUS: full TOTAL TIME TAKING CARE OF THIS PATIENT: 33 minutes.   POSSIBLE D/C IN 2-3DAYS, DEPENDING ON CLINICAL CONDITION.   Mysty Kielty,  Karenann Cai.D on 12/05/2016 at 12:27 PM  Between 7am to 6pm - Pager - [phone]  After  6pm: House Pager: - [phone]  [facility]  Office  [phone]  CC: Primary care physician; Tracie Harrier, MD

## 2016-12-05 NOTE — Progress Notes (Signed)
SLP Note  Patient Details Name: Kurt Hill MRN: [id number] DOB: [birth year]   SLP NOTE:   Reviewed chart and spoke with pt and nsg. Pt and nsg report that pt is tolerating diet well. Nsg reports that pt has been swallowing pills better in puree and that he has been eating mostly pureed foods on tray. Recommend continue w/current diet and will f/u in 1-2 days.      [place] 12/05/2016, 12:33 PM

## 2016-12-06 LAB — SEDIMENTATION RATE: SED RATE: 52 mm/h — AB (ref 0–20)

## 2016-12-06 LAB — C-REACTIVE PROTEIN: CRP: 11.6 mg/dL — AB (ref <1.0)

## 2016-12-06 MED ORDER — DOXYCYCLINE HYCLATE 100 MG PO TABS
100.0000 mg | ORAL_TABLET | Freq: Two times a day (BID) | ORAL | Status: DC
  Administered 2016-12-06 – 2016-12-07 (×2): 100 mg via ORAL
  Filled 2016-12-06 (×2): qty 1

## 2016-12-06 MED ORDER — LACTULOSE 10 GM/15ML PO SOLN: 30.0000 g | Freq: Two times a day (BID) | ORAL | Status: DC | PRN

## 2016-12-06 MED ORDER — MAGNESIUM CITRATE PO SOLN
1.0000 | Freq: Once | ORAL | Status: AC
  Administered 2016-12-06: 1 via ORAL
  Filled 2016-12-06: qty 296

## 2016-12-06 NOTE — Progress Notes (Signed)
[place] [place] NAME: Kurt Hill    MRN#:  [id number]  [place] OF BIRTH:  August 06, 1932  SUBJECTIVE:  Hospital Day: 3 days Kurt Hill is a 82 y.o. male presenting with Fatigue .   Overnight events: no acute overnight events,  Interval Events: still complains of nausea, Had an enema yesterday with minimal results  REVIEW OF SYSTEMS:  CONSTITUTIONAL: No fever,positive  fatigue or weakness.  EYES: No blurred or double vision.  EARS, NOSE, AND THROAT: No tinnitus or ear pain.  RESPIRATORY: No cough, shortness of breath, wheezing or hemoptysis.  CARDIOVASCULAR: No chest pain, orthopnea, edema.  GASTROINTESTINAL: positive nausea, vomiting, denies diarrhea or abdominal pain.  GENITOURINARY: No dysuria, hematuria.  ENDOCRINE: No polyuria, nocturia,  HEMATOLOGY: No anemia, easy bruising or bleeding SKIN: No rash or lesion. MUSCULOSKELETAL: No joint pain or arthritis.   NEUROLOGIC: No tingling, numbness, weakness.  PSYCHIATRY: No anxiety or depression.   DRUG ALLERGIES:   Allergies  Allergen Reactions  . Ivp Dye [Iodinated Diagnostic Agents] Itching and Rash  . Sulfa Antibiotics Rash    VITALS:  Blood pressure (!) 98/55, pulse 66, temperature 97.8 F (36.6 C), temperature source Oral, resp. rate 18, height 5\' 10"  (1.778 m), weight 65.2 kg (143 lb 12.8 oz), SpO2 97 %.  PHYSICAL EXAMINATION:  VITAL SIGNS: Vitals:   12/06/16 0442 12/06/16 0827  BP: (!) 114/57 (!) 98/55  Pulse: 66 66  Resp: 19 18  Temp: 97.7 F (36.5 C) 97.8 F (36.6 C)   GENERAL:82 y.o.male currently in no acute distress. Disheveled  HEAD: Normocephalic, atraumatic.  EYES: Pupils equal, round, reactive to light. Extraocular muscles intact. No scleral icterus. Bilateral conjunctivitis MOUTH: Moist mucosal membrane. Dentition intact. No abscess noted.  EAR, NOSE, THROAT: Clear without exudates. No external lesions.  NECK: Supple. No thyromegaly. No nodules.  No JVD.  PULMONARY: Clear to ascultation, without wheeze rails or rhonci. No use of accessory muscles, Good respiratory effort. good air entry bilaterally CHEST: Nontender to palpation.  CARDIOVASCULAR: S1 and S2. Regular rate and rhythm. No murmurs, rubs, or gallops. No edema. Pedal pulses 2+ bilaterally.  GASTROINTESTINAL: Soft, nontender, nondistended. No masses. Hypoactive bowel sounds. No hepatosplenomegaly.  MUSCULOSKELETAL: No swelling, clubbing, or edema. Range of motion full in all extremities.  NEUROLOGIC: Cranial nerves II through XII are intact. No gross focal neurological deficits. Sensation intact. Reflexes intact.  SKIN: external wire, left knee, left heel wound - weeping, No ulceration, lesions, rashes, or cyanosis. Skin warm and dry. Turgor intact.  PSYCHIATRIC: Mood, affect within normal limits. The patient is awake, alert and oriented x 3. Insight, judgment intact.      LABORATORY PANEL:   CBC  Recent Labs Lab 12/04/16 0414  WBC 10.9*  HGB 11.8*  HCT 36.7*  PLT 442*   ------------------------------------------------------------------------------------------------------------------  Chemistries   Recent Labs Lab 12/04/16 0414  NA 138  K 4.1  CL 101  CO2 30  GLUCOSE 95  BUN 26*  CREATININE 1.04  CALCIUM 10.7*   ------------------------------------------------------------------------------------------------------------------  Cardiac Enzymes  Recent Labs Lab 12/03/16 1055  TROPONINI <0.03   ------------------------------------------------------------------------------------------------------------------  RADIOLOGY:  Mr Foot Left Wo Contrast  Result Date: 12/04/2016 CLINICAL DATA:  Pressure all stairs on the left foot. EXAM: MRI OF THE LEFT FOOT WITHOUT CONTRAST TECHNIQUE: Multiplanar, multisequence MR imaging of the ankle was performed. No intravenous contrast was administered. COMPARISON:  None. FINDINGS: TENDONS Peroneal: Peroneal longus  tendon intact. Peroneal brevis intact. Posteromedial: Posterior tibial tendon  intact. Flexor hallucis longus tendon intact. Flexor digitorum longus tendon intact. Anterior: Tibialis anterior tendon intact. Extensor hallucis longus tendon intact Extensor digitorum longus tendon intact. Achilles:  Intact. Plantar Fascia: Intact. LIGAMENTS Lateral: Anterior talofibular ligament intact. Calcaneofibular ligament intact. Posterior talofibular ligament intact. Anterior and posterior tibiofibular ligaments intact. Medial: Deltoid ligament intact. Spring ligament intact. CARTILAGE Ankle Joint: No joint effusion. Normal ankle mortise. No chondral defect. Subtalar Joints/Sinus Tarsi: Normal subtalar joints. No subtalar joint effusion. Normal sinus tarsi. Bones and Soft Tissue: Relative pes cavus. Soft tissue ulcer along the posterolateral aspect of the calcaneus extending down to the bone. Underlying cortical destruction and bone marrow edema most consistent with osteomyelitis. No focal fluid collection to suggest an abscess. IMPRESSION: 1. Soft tissue ulcer overlying the posterolateral calcaneus extending down to the bone with underlying cortical destruction and bone marrow edema most consistent with posterior calcaneal osteomyelitis. No focal fluid collection to suggest an abscess. Electronically Signed   By: Kathreen Devoid   On: 12/04/2016 14:46    EKG:   Orders placed or performed during the hospital encounter of 12/03/16  . EKG 12-Lead  . EKG 12-Lead  . EKG 12-Lead  . EKG 12-Lead  . EKG    ASSESSMENT AND PLAN:   Kurt Hill is a 82 y.o. male presenting with Fatigue . Admitted 12/03/2016 : Day #: 3 days   1. Possible osteomyelitis/wound infection: Left heel, left patella: Okay to discontinue vancomycin, infectious disease input pending, appreciate podiatry input  2. Constipation: hypoactive bowel movement - bowel regiment, increased bowel regimen , soapsuds enema today 3. Conjunctivitis: eye drops 4.  Hypothyroidism unspecified: Synthroid 5. COPD unspecified: Continue home medications 6. BPH: Proscar 7. Venous thromboembolism prophylactic: Lovenox   All the records are reviewed and case discussed with Care Management/Social Workerr. Management plans discussed with the patient, family and they are in agreement.  CODE STATUS: full TOTAL TIME TAKING CARE OF THIS PATIENT: 33 minutes.   POSSIBLE D/C IN 2-3DAYS, DEPENDING ON CLINICAL CONDITION.   Jules Vidovich,  Karenann Cai.D on 12/06/2016 at 2:00 PM  Between 7am to 6pm - Pager - [phone]  After 6pm: House Pager: - [facility]  Office  [phone]  CC: Primary care physician; Tracie Harrier, MD

## 2016-12-06 NOTE — Consult Note (Signed)
[facility] Infectious Disease     Reason for Consult: Infected heel ulcer    Referring Physician: Lavetta Nielsen, D Date of Admission:  12/03/2016   Active Problems:   Cellulitis   Protein-calorie malnutrition, severe   HPI: Kurt Hill is a 82 y.o. male admitted with wound infection on heel and also chronic patella wound  (prior culture with Coag neg staph).   On this admit wbc was 13.6, Started vanco and zosyn.  He has had patella repair and had infection of this in Sept with CNS on culture. He developed the L heel ulcer from being immobile  Past Medical History:  Diagnosis Date  . Anemia   . Asthma   . BPH (benign prostatic hyperplasia)   . COPD (chronic obstructive pulmonary disease) (HCC)    not on home oxygen  . Coronary artery disease   . Hypotension   . Hypothyroidism   . Skin abnormalities   . Skin cancer    Past Surgical History:  Procedure Laterality Date  . APPENDECTOMY    . BONE RESECTION     on nose  . MOHS SURGERY    . ORIF PATELLA Left 07/30/2016   Procedure: OPEN REDUCTION INTERNAL (ORIF) FIXATION PATELLA;  Surgeon: Hessie Knows, MD;  Location: [facility] OR[facility];  Service: Orthopedics;  Laterality: Left;  . ORIF PATELLA Left 08/15/2016   Procedure: OPEN REDUCTION INTERNAL (ORIF) FIXATION PATELLA;  Surgeon: Hessie Knows, MD;  Location: [facility] OR[facility];  Service: Orthopedics;  Laterality: Left;  . ORIF PATELLA Left 10/14/2016   Procedure: OPEN REDUCTION INTERNAL (ORIF) FIXATION PATELLA;  Surgeon: Hessie Knows, MD;  Location: [facility] OR[facility];  Service: Orthopedics;  Laterality: Left;  . TONSILLECTOMY     Social History  Substance Use Topics  . Smoking status: Never Smoker  . Smokeless tobacco: Never Used     Comment: tried cigarettes when young- but never really smoked  . Alcohol use No   Family History  Problem Relation Age of Onset  . CAD Other     Allergies:  Allergies  Allergen Reactions  . Ivp Dye [Iodinated Diagnostic Agents] Itching and Rash  . Sulfa Antibiotics Rash     Current antibiotics: Antibiotics Given (last 72 hours)    Date/Time Action Medication Dose Rate   12/03/16 2113 Given   piperacillin-tazobactam (ZOSYN) IVPB 3.375 g 3.375 g 12.5 mL/hr   12/04/16 0504 Given   piperacillin-tazobactam (ZOSYN) IVPB 3.375 g 3.375 g 12.5 mL/hr   12/04/16 1424 Given   piperacillin-tazobactam (ZOSYN) IVPB 3.375 g 3.375 g 12.5 mL/hr   12/04/16 2234 Given   piperacillin-tazobactam (ZOSYN) IVPB 3.375 g 3.375 g 12.5 mL/hr   12/05/16 0519 Given   piperacillin-tazobactam (ZOSYN) IVPB 3.375 g 3.375 g 12.5 mL/hr   12/05/16 1437 Given   piperacillin-tazobactam (ZOSYN) IVPB 3.375 g 3.375 g 12.5 mL/hr   12/05/16 2044 Given   piperacillin-tazobactam (ZOSYN) IVPB 3.375 g 3.375 g 12.5 mL/hr   12/06/16 0515 Given   piperacillin-tazobactam (ZOSYN) IVPB 3.375 g 3.375 g 12.5 mL/hr   12/06/16 1437 Given   piperacillin-tazobactam (ZOSYN) IVPB 3.375 g 3.375 g 12.5 mL/hr      MEDICATIONS: . aspirin EC  81 mg Oral q morning - 10a  . enoxaparin (LOVENOX) injection  40 mg Subcutaneous Q24H  . feeding supplement (ENSURE ENLIVE)  237 mL Oral TID BM  . finasteride  5 mg Oral q morning - 10a  . fludrocortisone  0.1 mg Oral Daily  . levothyroxine  100 mcg Oral QAC breakfast  .  megestrol  40 mg Oral Daily  . metoCLOPramide  10 mg Oral TID AC & HS  . midodrine  10 mg Oral TID WC  . mometasone-formoterol  2 puff Inhalation BID  . multivitamin with minerals  1 tablet Oral Daily  . piperacillin-tazobactam (ZOSYN)  IV  3.375 g Intravenous Q8H  . polyethylene glycol  17 g Oral Daily  . senna-docusate  2 tablet Oral BID  . sertraline  25 mg Oral Daily  . trimethoprim-polymyxin b  1 drop Both Eyes Q4H    Review of Systems - 11 systems reviewed and negative per HPI   OBJECTIVE: Temp:  [97.7 F (36.5 C)-98 F (36.7 C)] 97.8 F (36.6 C) (12/29 0827) Pulse Rate:  [66-72] 66 (12/29 0827) Resp:  [18-19] 18 (12/29 0827) BP: (98-127)/(48-65) 98/55 (12/29 0827) SpO2:  [93 %-97  %] 97 % (12/29 0827)  Physical Exam  Constitutional: He is oriented to person, place, and time. Very frail HENT: anicteric Mouth/Throat: Oropharynx is clear and moist. No oropharyngeal exudate.  Cardiovascular: Normal rate, regular rhythm and normal heart sounds.  Pulmonary/Chest: Effort normal and breath sounds normal. No respiratory distress. He has no wheezes.  Abdominal: Soft. Bowel sounds are normal. He exhibits no distension. There is no tenderness.  Lymphadenopathy: He has no cervical adenopathy.  Neurological: He is alert and oriented to person, place, and time.  Ext L knee very immobile, bony deformity with open wound anteriorly but clean base Skin: L heel with decub ulcer but clean base, no exposed bone. Ant shin on L with bullae and some erythema Psychiatric: He has a normal mood and affect. His behavior is normal.   LABS: No results found for this or any previous visit (from the past 48 hour(s)). No components found for: ESR, C REACTIVE PROTEIN MICRO: Recent Results (from the past 720 hour(s))  Culture, blood (Routine X 2) w Reflex to ID Panel     Status: None (Preliminary result)   Collection Time: 12/03/16 12:52 PM  Result Value Ref Range Status   Specimen Description BLOOD RIGHT ASSIST CONTROL  Final   Special Requests   Final    BOTTLES DRAWN AEROBIC AND ANAEROBIC  AEROBIC [place], ANAEROBIC 10CC   Culture NO GROWTH 3 DAYS  Final   Report Status PENDING  Incomplete  Culture, blood (Routine X 2) w Reflex to ID Panel     Status: None (Preliminary result)   Collection Time: 12/03/16 12:52 PM  Result Value Ref Range Status   Specimen Description BLOOD LEFT ASSIST CONTROL  Final   Special Requests   Final    BOTTLES DRAWN AEROBIC AND ANAEROBIC  AEROBIC 1CC ANAEROBIC [place]   Culture NO GROWTH 3 DAYS  Final   Report Status PENDING  Incomplete  MRSA PCR Screening     Status: None   Collection Time: 12/03/16  4:20 PM  Result Value Ref Range Status   MRSA by PCR NEGATIVE  NEGATIVE Final    Comment:        The GeneXpert MRSA Assay (FDA approved for NASAL specimens only), is one component of a comprehensive MRSA colonization surveillance program. It is not intended to diagnose MRSA infection nor to guide or monitor treatment for MRSA infections.     IMAGING: Dg Chest 1 View  Result Date: 11/28/2016 CLINICAL DATA:  Hypotension. EXAM: CHEST 1 VIEW COMPARISON:  07/30/2016 FINDINGS: 1818 hours. The cardio pericardial silhouette is enlarged. There is pulmonary vascular congestion without overt pulmonary edema. Appearance of air-fluid level at  the left base is probably artifact. Interstitial markings are diffusely coarsened with chronic features. Bones are diffusely demineralized. Telemetry leads overlie the chest. IMPRESSION: Cardiomegaly with chronic underlying interstitial changes. Probable artifact at the left base although air-fluid level could have this appearance. Dedicated PA and lateral chest x-ray, when patient is able, recommended to further evaluate. Electronically Signed   By: Misty Stanley M.D.   On: 11/28/2016 18:41   Dg Chest 2 View  Result Date: 11/28/2016 CLINICAL DATA:  Hypotension. EXAM: CHEST  2 VIEW COMPARISON:  Portable AP view earlier this day. FINDINGS: The previous question air-fluid level at the left lung base is no longer seen and was likely artifact. Mildly enlarged cardiac silhouette is unchanged. There are small bilateral pleural effusions. Minimal fluid in the fissures. Increased interstitial markings are again seen, may be chronic, however are slightly more prominent than on prior exam. There is no pneumothorax. No acute osseous abnormality. IMPRESSION: 1. Small pleural effusions. Increased interstitial markings may be chronic, however appear increased from prior exam raising concern for pulmonary edema. Findings may reflect mild fluid overload. 2. Previous questioned air-fluid level projecting over the left lung base is not seen and is  likely artifact. Electronically Signed   By: Jeb Levering M.D.   On: 11/28/2016 21:39   Mr Foot Left Wo Contrast  Result Date: 12/04/2016 CLINICAL DATA:  Pressure all stairs on the left foot. EXAM: MRI OF THE LEFT FOOT WITHOUT CONTRAST TECHNIQUE: Multiplanar, multisequence MR imaging of the ankle was performed. No intravenous contrast was administered. COMPARISON:  None. FINDINGS: TENDONS Peroneal: Peroneal longus tendon intact. Peroneal brevis intact. Posteromedial: Posterior tibial tendon intact. Flexor hallucis longus tendon intact. Flexor digitorum longus tendon intact. Anterior: Tibialis anterior tendon intact. Extensor hallucis longus tendon intact Extensor digitorum longus tendon intact. Achilles:  Intact. Plantar Fascia: Intact. LIGAMENTS Lateral: Anterior talofibular ligament intact. Calcaneofibular ligament intact. Posterior talofibular ligament intact. Anterior and posterior tibiofibular ligaments intact. Medial: Deltoid ligament intact. Spring ligament intact. CARTILAGE Ankle Joint: No joint effusion. Normal ankle mortise. No chondral defect. Subtalar Joints/Sinus Tarsi: Normal subtalar joints. No subtalar joint effusion. Normal sinus tarsi. Bones and Soft Tissue: Relative pes cavus. Soft tissue ulcer along the posterolateral aspect of the calcaneus extending down to the bone. Underlying cortical destruction and bone marrow edema most consistent with osteomyelitis. No focal fluid collection to suggest an abscess. IMPRESSION: 1. Soft tissue ulcer overlying the posterolateral calcaneus extending down to the bone with underlying cortical destruction and bone marrow edema most consistent with posterior calcaneal osteomyelitis. No focal fluid collection to suggest an abscess. Electronically Signed   By: Kathreen Devoid   On: 12/04/2016 14:46   Dg Chest Port 1 View  Result Date: 12/03/2016 CLINICAL DATA:  Altered mental status EXAM: PORTABLE CHEST 1 VIEW COMPARISON:  November 28, 2016 FINDINGS:  There is no edema or consolidation. The heart size and pulmonary vascularity are normal. No adenopathy. No bone lesions. IMPRESSION: No edema or consolidation. Electronically Signed   By: Lowella Grip III M.D.   On: 12/03/2016 12:20   Dg Abd Portable 1v  Result Date: 12/04/2016 CLINICAL DATA:  Evaluation for abdominal distention. Patient states he has not eaten for several days and has been constipation. EXAM: PORTABLE ABDOMEN - 1 VIEW COMPARISON:  01/18/2014 FINDINGS: There is mild distention of the right colon with increased air noted in the colon and small bowel, but no evidence of obstruction. Moderate stool is noted in the rectum and lower sigmoid with mild increased stool  noted in the right colon. No gross soft tissue abnormality. There is a compression fracture of L1, which appears new since the prior study, but stable when compared to lumbar spine radiographs dated 07/31/2016. IMPRESSION: 1. No bowel obstruction or acute abnormality. 2. Increased bowel gas with mild distention of the right colon. Mild increased stool in the right colon and the rectum and lower sigmoid colon. Electronically Signed   By: Lajean Manes M.D.   On: 12/04/2016 09:30   Dg Foot Complete Left  Result Date: 11/28/2016 CLINICAL DATA:  Nonhealing left heel wound. EXAM: LEFT FOOT - COMPLETE 3+ VIEW COMPARISON:  None. FINDINGS: After recent knee surgery there is difficulty with positioning the foot and the dorsal aspect of the heel is obscured by collimator. Per technologist the patient's wound is on the plantar surface of the heel, which is visualized. There is no opaque foreign body, soft tissue gas, or evidence of osteomyelitis. No fracture deformity or subluxation. Hammertoe deformities. Osteopenia. IMPRESSION: No evidence of osteomyelitis or foreign body. Please see description above. Electronically Signed   By: Monte Fantasia M.D.   On: 11/28/2016 18:44    Assessment:   AYAANSH SMAIL is a 82 y.o. male with recent  L patella knee surgery 12/5828 complicated by wound infection in Sept (with CNS) now largely immobile and at facility with L heel ulcer and open wound on L knee. No evidence of sepsis. Wounds relatively clean but I have cultured L heel. MRI shows possible calcaneal osteomyeltiis but no exposed bone and podiatry not convinced Bcx negative  Recommendations I have cultured wound from heel today Check esr and crp - ordered Change to oral doxy 100 bid - would plan on a 6 week course I will follow up on wound culture  Thank you very much for allowing me to participate in the care of this patient. Please call with questions.   Cheral Marker. Ola Spurr, MD

## 2016-12-06 NOTE — Progress Notes (Signed)
Patient had soap suds enema with little results, will continue to monitor.

## 2016-12-06 NOTE — Progress Notes (Signed)
Pharmacy Antibiotic Note  Kurt Hill is a 82 y.o. male admitted on 12/03/2016 with cellulitis. Pharmacy has been consulted for piperacillin/tazobactam dosing. Pt has had patella repair and currently has L heel ulcer. Left knee incision Cx 08/2016 CoNS. Left heel was cultured today. ?osteomyelitis   Plan: Patient will be transitioned from piperacillin/tazobactam to an extended course of doxycycline 100 mg PO BID to begin tonight. Per ID, pt will need 6 weeks of doxycycline.    Height: 5\' 10"  (177.8 cm) Weight: 143 lb 12.8 oz (65.2 kg) IBW/kg (Calculated) : 73  Temp (24hrs), Avg:97.9 F (36.6 C), Min:97.7 F (36.5 C), Max:98 F (36.7 C)   Recent Labs Lab 12/03/16 1055 12/03/16 1252 12/03/16 1549 12/04/16 0414  WBC 13.6*  --   --  10.9*  CREATININE 1.01  --   --  1.04  LATICACIDVEN  --  1.0 1.2  --     Estimated Creatinine Clearance: 50.5 mL/min (by C-G formula based on SCr of 1.04 mg/dL).    Allergies  Allergen Reactions  . Ivp Dye [Iodinated Diagnostic Agents] Itching and Rash  . Sulfa Antibiotics Rash    Antimicrobials this admission: Vancomycin 12/26 >> 12/29 Piperacillin/tazobactam 12/26 >> 12/29 Doxycycline 12/29 >>  Dose adjustments this admission:  Microbiology results: 12/26 BCx: pending NG x 3 days 12/26 MRSA PCR: negative 12/29 Wound Cx: pending  Thank you for allowing pharmacy to be a part of this patient's care.  Darrow Bussing, PharmD Pharmacy Resident 12/06/2016 4:31 PM

## 2016-12-06 NOTE — Progress Notes (Signed)
Speech Language Pathology Treatment: Dysphagia  Patient Details Name: Kurt Hill MRN: [id number] DOB: 22-May-1932 Today's Date: 12/06/2016 Time: 1240-1300 SLP Time Calculation (min) (ACUTE ONLY): 20 min  Assessment / Plan / Recommendation Clinical Impression  Pt appears to be tolerating Dysphagia III diet w/thin liquids well. No overt s/s of aspiration were observed with any mechanical soft, puree or thin consistencies. Vocal quality remained clear throughout meal. Pt demonstrated mildly increased mastication time with mechanical soft but was able to clear w/f/u sip of liquid and/or puree. Pt consumed half of his lunch plate and about 4 ounces of liquid and then reported he felt like he was nauseated and wanted to stop eating. Discussed with nsg. Recommend continue w/dysphagia III diet w/thin liquids. Aspiration precautions. Pt requires assistance taking PO's. Will f/u re: toleration of diet in 1-2 days.    HPI HPI: Kurt Hill  is a 82 y.o. male with a known history of Hypotension on midodrine, failure left patellar surgery who is presenting from his nursing facility for "declining health". Nsg reports that pt has not been eating very much, but apparently tolerating what he is eating.       SLP Plan  Continue with current plan of care     Recommendations  Diet recommendations: Dysphagia 3 (mechanical soft);Thin liquid Liquids provided via: Straw;Cup Medication Administration: Whole meds with puree Supervision: Staff to assist with self feeding Compensations: Minimize environmental distractions;Slow rate;Small sips/bites;Follow solids with liquid Postural Changes and/or Swallow Maneuvers: Seated upright 90 degrees                Oral Care Recommendations: Oral care BID;Staff/trained caregiver to provide oral care Follow up Recommendations: Skilled Nursing facility Plan: Continue with current plan of care       GO                [place] 12/06/2016, 1:22 PM

## 2016-12-07 LAB — CBC
HEMATOCRIT: 29.4 % — AB (ref 40.0–52.0)
HEMOGLOBIN: 9.6 g/dL — AB (ref 13.0–18.0)
MCH: 27.6 pg (ref 26.0–34.0)
MCHC: 32.6 g/dL (ref 32.0–36.0)
MCV: 84.7 fL (ref 80.0–100.0)
Platelets: 468 10*3/uL — ABNORMAL HIGH (ref 150–440)
RBC: 3.47 MIL/uL — AB (ref 4.40–5.90)
RDW: 19.1 % — ABNORMAL HIGH (ref 11.5–14.5)
WBC: 7.5 10*3/uL (ref 3.8–10.6)

## 2016-12-07 LAB — CREATININE, SERUM: Creatinine, Ser: 0.99 mg/dL (ref 0.61–1.24)

## 2016-12-07 MED ORDER — MEGESTROL ACETATE 40 MG PO TABS: 40.0000 mg | ORAL_TABLET | Freq: Every day | ORAL | 0 refills | Status: DC

## 2016-12-07 MED ORDER — TAMSULOSIN HCL 0.4 MG PO CAPS
0.4000 mg | ORAL_CAPSULE | Freq: Every day | ORAL | Status: DC
  Administered 2016-12-07: 0.4 mg via ORAL
  Filled 2016-12-07: qty 1

## 2016-12-07 MED ORDER — PANTOPRAZOLE SODIUM 40 MG PO TBEC
40.0000 mg | DELAYED_RELEASE_TABLET | Freq: Every day | ORAL | Status: DC
  Administered 2016-12-07 (×2): 40 mg via ORAL
  Filled 2016-12-07 (×2): qty 1

## 2016-12-07 MED ORDER — OXYCODONE HCL 5 MG PO TABS: 5.0000 mg | ORAL_TABLET | ORAL | 0 refills | Status: DC | PRN

## 2016-12-07 MED ORDER — POLYMYXIN B-TRIMETHOPRIM 10000-0.1 UNIT/ML-% OP SOLN: 1.0000 [drp] | OPHTHALMIC | 0 refills | Status: AC

## 2016-12-07 MED ORDER — BISACODYL 5 MG PO TBEC: 5.0000 mg | DELAYED_RELEASE_TABLET | Freq: Every day | ORAL | 0 refills | Status: DC | PRN

## 2016-12-07 MED ORDER — ALUM & MAG HYDROXIDE-SIMETH 200-200-20 MG/5ML PO SUSP: 30.0000 mL | Freq: Four times a day (QID) | ORAL | Status: DC | PRN

## 2016-12-07 MED ORDER — TAMSULOSIN HCL 0.4 MG PO CAPS: 0.4000 mg | ORAL_CAPSULE | Freq: Every day | ORAL | Status: AC

## 2016-12-07 MED ORDER — DOXYCYCLINE HYCLATE 100 MG PO TABS: 100.0000 mg | ORAL_TABLET | Freq: Two times a day (BID) | ORAL | 1 refills | Status: AC

## 2016-12-07 NOTE — Progress Notes (Signed)
Speech Language Pathology Treatment: Dysphagia  Patient Details Name: Kurt Hill MRN: [id number] DOB: [birth year] Today's Date: 12/07/2016 Time: 1050-1110 SLP Time Calculation (min) (ACUTE ONLY): 20 min   Assessment / Plan / Recommendation Clinical Impression  Pt presents with a mild oral dysphagia characterized by poor mastication and noted oral residue post intake. Pt requires cues for liquid wash post intake to clear residue. Pt is adamant that he has no deficits with swallowing and it angry that anyone thought he did. slp assist for rate control with straws and when it was pt controlled coughed x 1 with him stating he always coughs and it was not swallowing related. SLP attempted education with pt refusing to cooperate or accept learning.    HPI HPI: Kurt Hill  is a 82 y.o. male with a known history of Hypotension on midodrine, failure left patellar surgery who is presenting from his nursing facility for "declining health". Nsg reports that pt has not been eating very much, but apparently tolerating what he is eating.       SLP Plan  Continue with current plan of care     Recommendations  Diet recommendations: Dysphagia 3 (mechanical soft);Thin liquid Liquids provided via: Straw;Cup Medication Administration: Whole meds with puree Supervision: Staff to assist with self feeding Compensations: Minimize environmental distractions;Slow rate;Small sips/bites;Follow solids with liquid Postural Changes and/or Swallow Maneuvers: Seated upright 90 degrees                Oral Care Recommendations: Oral care BID;Staff/trained caregiver to provide oral care Follow up Recommendations: Skilled Nursing facility Plan: Continue with current plan of care       [name] 12/07/2016, 12:22 PM

## 2016-12-07 NOTE — Progress Notes (Signed)
Approximately 40 minutes after pt left this unit with EMS, pt daughter in law arrived. This Probation officer notified her that pt was transferred to [facility]. Family member stated that she called [facility] and was told by them that pt could return there. This Probation officer explained that per social work here and at [facility], that pt was not able to return there, and that pt was aware of this prior to today. Family member was given script for pt to give to [facility] that was not finished when pt left, and was also given a small clear box of pt belongings that were inadvertantly left behind, family member stated they would deliver these.

## 2016-12-07 NOTE — Discharge Summary (Signed)
[facility] [place] NAME: Kurt Hill    MR#:  [id number]  DATE OF BIRTH:  January 23, 1932  DATE OF ADMISSION:  12/03/2016 ADMITTING PHYSICIAN: Lytle Butte, MD  DATE OF DISCHARGE: 12/07/16  PRIMARY CARE PHYSICIAN: Tracie Harrier, MD    ADMISSION DIAGNOSIS:  Wound infection [T14.8XXA, L08.9]  DISCHARGE DIAGNOSIS:  Active Problems:   Cellulitis   Protein-calorie malnutrition, severe left heal osteomyelitis  SECONDARY DIAGNOSIS:   Past Medical History:  Diagnosis Date  . Anemia   . Asthma   . BPH (benign prostatic hyperplasia)   . COPD (chronic obstructive pulmonary disease) (HCC)    not on home oxygen  . Coronary artery disease   . Hypotension   . Hypothyroidism   . Skin abnormalities   . Skin cancer     HOSPITAL COURSE:  Kurt Hill  is a 82 y.o. male admitted 12/03/2016 with chief complaint Fatigue . Please see H&P performed by Lytle Butte, MD for further information. Patient presented from his nursing facility for "declining health" On arrival to ED noted to have wound infection of left heel. Started on broad antibiotics, underwent MRI concerning for early osteomyelitis. Evaluated by podiatry - who felt no need for surgical intervention. Infectious disease also consulted for assistance in adjusting antibiotics - recommended 6 week - doxycycline  Patient had issues with constipation - resolved after bowel regiment with enema. Had issue with urinary retention - foley catheter placed - urology advised 1 week follow up  DISCHARGE CONDITIONS:   stable  CONSULTS OBTAINED:  Treatment Team:  Lytle Butte, MD Dereck Leep, MD Ebbie Ridge, MD Leonel Ramsay, MD  DRUG ALLERGIES:   Allergies  Allergen Reactions  . Ivp Dye [Iodinated Diagnostic Agents] Itching and Rash  . Sulfa Antibiotics Rash    DISCHARGE MEDICATIONS:   Current Discharge Medication List    START taking these medications   Details    bisacodyl (DULCOLAX) 5 MG EC tablet Take 1 tablet (5 mg total) by mouth daily as needed for moderate constipation. Qty: 30 tablet, Refills: 0    doxycycline (VIBRA-TABS) 100 MG tablet Take 1 tablet (100 mg total) by mouth every 12 (twelve) hours. Qty: 60 tablet, Refills: 1    tamsulosin (FLOMAX) 0.4 MG CAPS capsule Take 1 capsule (0.4 mg total) by mouth daily. Qty: 30 capsule    trimethoprim-polymyxin b (POLYTRIM) ophthalmic solution Place 1 drop into both eyes every 4 (four) hours. Qty: 10 mL, Refills: 0      CONTINUE these medications which have CHANGED   Details  !! megestrol (MEGACE) 40 MG tablet Take 1 tablet (40 mg total) by mouth daily. Qty: 30 tablet, Refills: 0     !! - Potential duplicate medications found. Please discuss with provider.    CONTINUE these medications which have NOT CHANGED   Details  acetaminophen (TYLENOL) 325 MG tablet Take by mouth every 4 (four) hours as needed.    albuterol (PROAIR HFA) 108 (90 Base) MCG/ACT inhaler Inhale 2 puffs into the lungs every 6 (six) hours as needed for wheezing or shortness of breath.     albuterol (PROVENTIL) (2.5 MG/3ML) 0.083% nebulizer solution Take 2.5 mg by nebulization every 6 (six) hours as needed for wheezing or shortness of breath.    alum & mag hydroxide-simeth (MYLANTA) [id number] MG/5ML suspension Take 30 mLs by mouth every 6 (six) hours as needed for indigestion or heartburn.    aspirin EC 81 MG tablet  Take 81 mg by mouth every morning.    budesonide-formoterol (SYMBICORT) 80-4.5 MCG/ACT inhaler Inhale 2 puffs into the lungs 2 (two) times daily.    finasteride (PROSCAR) 5 MG tablet Take 5 mg by mouth every morning.    fludrocortisone (FLORINEF) 0.1 MG tablet Take 0.1 mg by mouth daily.    lactulose (CHRONULAC) 10 GM/15ML solution Take 45 mLs (30 g total) by mouth daily as needed for moderate constipation. Qty: 473 mL, Refills: 0    levothyroxine (SYNTHROID, LEVOTHROID) 100 MCG tablet Take 1 tablet by  mouth daily.    !! megestrol (MEGACE) 40 MG tablet Take 40 mg by mouth daily.    metoCLOPramide (REGLAN) 10 MG tablet Take 10 mg by mouth 4 (four) times daily -  before meals and at bedtime.    midodrine (PROAMATINE) 5 MG tablet Take 1 tablet (5 mg total) by mouth 3 (three) times daily with meals. Qty: 90 tablet, Refills: 0    ondansetron (ZOFRAN) 4 MG tablet Take 4 mg by mouth every 8 (eight) hours as needed for nausea or vomiting.    oxyCODONE (OXY IR/ROXICODONE) 5 MG immediate release tablet Take 1 tablet (5 mg total) by mouth every 4 (four) hours as needed for moderate pain or severe pain. Qty: 30 tablet, Refills: 0    polyethylene glycol (MIRALAX / GLYCOLAX) packet Take 17 g by mouth daily. Qty: 14 each, Refills: 0    sennosides-docusate sodium (SENOKOT-S) 8.6-50 MG tablet Take 2 tablets by mouth 2 (two) times daily.    sertraline (ZOLOFT) 25 MG tablet Take 25 mg by mouth daily.    Vitamin D, Ergocalciferol, (DRISDOL) 50000 units CAPS capsule Take 1 capsule by mouth every 7 (seven) days.    feeding supplement, ENSURE ENLIVE, (ENSURE ENLIVE) LIQD Take 237 mLs by mouth 2 (two) times daily between meals. Qty: 237 mL, Refills: 12     !! - Potential duplicate medications found. Please discuss with provider.    STOP taking these medications     collagenase (SANTYL) ointment      mupirocin ointment (BACTROBAN) 2 %          DISCHARGE INSTRUCTIONS:  Keep foley for 1 week  Wound care  LEft heel with stage 3 pressure injury.  Ruddy red wound bed. Will begin absorptive dressing (calcium alginate).   LEft heel is ofloaded in a Actor.  Wound type:Nonhealing stage 3 pressure injury to left heel Pressure Ulcer POA: Yes Measurement:Left heel 3 cm x 1.2 cm x 0.3 cm  Wound [id number] red Drainage (amount, consistency, odor) Minimal serosanguinous  No odor.  Periwound:Intact.  Erythema to left lower leg.  Dressing procedure/placement/frequency:Cleanse left heel with NS and  pat gently dry.  Apply calcium alginate to wound bed.  Cover with 4x4 gauze and kerlix.  Change daily and PRN soilage. Keep PRevalon boot on while in bed.   DIET:  Dysphagia 3 (Mech soft);Thin liquid   Liquid Administration via: Straw;Cup Medication Administration: Whole meds with puree Supervision: Staff to assist with self feeding Compensations: Minimize environmental distractions;Slow rate;Small sips/bites Postural Changes: Seated upright at 90 degrees   DISCHARGE CONDITION:  Stable  ACTIVITY:  Activity as tolerated  OXYGEN:  Home Oxygen: No.   Oxygen Delivery: room air  DISCHARGE LOCATION:  nursing home   If you experience worsening of your admission symptoms, develop shortness of breath, life threatening emergency, suicidal or homicidal thoughts you must seek medical attention immediately by calling 911 or calling your MD immediately  if symptoms  less severe.  You Must read complete instructions/literature along with all the possible adverse reactions/side effects for all the Medicines you take and that have been prescribed to you. Take any new Medicines after you have completely understood and accpet all the possible adverse reactions/side effects.   Please note  You were cared for by a hospitalist during your hospital stay. If you have any questions about your discharge medications or the care you received while you were in the hospital after you are discharged, you can call the unit and asked to speak with the hospitalist on call if the hospitalist that took care of you is not available. Once you are discharged, your primary care physician will handle any further medical issues. Please note that NO REFILLS for any discharge medications will be authorized once you are discharged, as it is imperative that you return to your primary care physician (or establish a relationship with a primary care physician if you do not have one) for your aftercare needs so that they can reassess  your need for medications and monitor your lab values.    On the day of Discharge:   VITAL SIGNS:  Blood pressure 131/74, pulse 79, temperature 97.4 F (36.3 C), temperature source Oral, resp. rate 16, height 5\' 10"  (1.778 m), weight 65.2 kg (143 lb 12.8 oz), SpO2 93 %.  I/O:   Intake/Output Summary (Last 24 hours) at 12/07/16 1059 Last data filed at 12/07/16 0535  Gross per 24 hour  Intake          2008.75 ml  Output              550 ml  Net          1458.75 ml    PHYSICAL EXAMINATION:  GENERAL:  82 y.o.-year-old patient lying in the bed with no acute distress. chronically ill appearing EYES: Pupils equal, round, reactive to light and accommodation. No scleral icterus. Extraocular muscles intact. Bilateral conjunctivitis HEENT: Head atraumatic, normocephalic. Oropharynx and nasopharynx clear.  NECK:  Supple, no jugular venous distention. No thyroid enlargement, no tenderness.  LUNGS: Normal breath sounds bilaterally, no wheezing, rales,rhonchi or crepitation. No use of accessory muscles of respiration.  CARDIOVASCULAR: S1, S2 normal. No murmurs, rubs, or gallops.  ABDOMEN: Soft, non-tender, non-distended. Bowel sounds present. No organomegaly or mass.  EXTREMITIES: No pedal edema, cyanosis, or clubbing.  NEUROLOGIC: Cranial nerves II through XII are intact. Muscle strength 5/5 in all extremities. Sensation intact. Gait not checked.  PSYCHIATRIC: The patient is alert and oriented x 3.  SKIN: left heel ulcer dressing clean/dry/intact, otherwise No obvious rash, lesion, or ulcer.   DATA REVIEW:   CBC  Recent Labs Lab 12/07/16 0519  WBC 7.5  HGB 9.6*  HCT 29.4*  PLT 468*    Chemistries   Recent Labs Lab 12/04/16 0414 12/07/16 0519  NA 138  --   K 4.1  --   CL 101  --   CO2 30  --   GLUCOSE 95  --   BUN 26*  --   CREATININE 1.04 0.99  CALCIUM 10.7*  --     Cardiac Enzymes  Recent Labs Lab 12/03/16 1055  TROPONINI <0.03    Microbiology Results    Results for orders placed or performed during the hospital encounter of 12/03/16  Culture, blood (Routine X 2) w Reflex to ID Panel     Status: None (Preliminary result)   Collection Time: 12/03/16 12:52 PM  Result Value Ref Range Status  Specimen Description BLOOD RIGHT ASSIST CONTROL  Final   Special Requests   Final    BOTTLES DRAWN AEROBIC AND ANAEROBIC  AEROBIC [place], ANAEROBIC 10CC   Culture NO GROWTH 4 DAYS  Final   Report Status PENDING  Incomplete  Culture, blood (Routine X 2) w Reflex to ID Panel     Status: None (Preliminary result)   Collection Time: 12/03/16 12:52 PM  Result Value Ref Range Status   Specimen Description BLOOD LEFT ASSIST CONTROL  Final   Special Requests   Final    BOTTLES DRAWN AEROBIC AND ANAEROBIC  AEROBIC 1CC ANAEROBIC [place]   Culture NO GROWTH 4 DAYS  Final   Report Status PENDING  Incomplete  MRSA PCR Screening     Status: None   Collection Time: 12/03/16  4:20 PM  Result Value Ref Range Status   MRSA by PCR NEGATIVE NEGATIVE Final    Comment:        The GeneXpert MRSA Assay (FDA approved for NASAL specimens only), is one component of a comprehensive MRSA colonization surveillance program. It is not intended to diagnose MRSA infection nor to guide or monitor treatment for MRSA infections.   Aerobic Culture (superficial specimen)     Status: None (Preliminary result)   Collection Time: 12/06/16  3:24 PM  Result Value Ref Range Status   Specimen Description HEEL  Final   Special Requests Normal  Final   Gram Stain   Final    NO WBC SEEN NO ORGANISMS SEEN Performed at [facility]    Culture PENDING  Incomplete   Report Status PENDING  Incomplete    RADIOLOGY:  No results found.   Management plans discussed with the patient, family and they are in agreement.  CODE STATUS:     Code Status Orders        Start     Ordered   12/03/16 1401  Full code  Continuous     12/03/16 1402    Code Status History    Date Active  Date Inactive Code Status Order ID Comments User Context   10/13/2016  5:47 AM 10/21/2016  9:14 PM Full Code [id number]  Harrie Foreman, MD ED   08/30/2016 11:58 AM 09/02/2016  7:47 PM Full Code [id number]  Hessie Knows, MD Inpatient   08/15/2016  3:18 PM 08/18/2016  8:03 PM Full Code [id number]  Hessie Knows, MD Inpatient   07/30/2016  4:27 PM 08/01/2016  7:55 PM Full Code [id number]  Hessie Knows, MD ED   12/14/2015  3:03 PM 12/15/2015  6:03 PM Full Code [id number]  Gladstone Lighter, MD Inpatient      TOTAL TIME TAKING CARE OF THIS PATIENT: 33 minutes.    Guiliana Shor,  Karenann Cai.D on 12/07/2016 at 10:59 AM  Between 7am to 6pm - Pager - [phone]  After 6pm go to [email or web address] - [employer] [facility]  Office  [phone]  CC: Primary care physician; Tracie Harrier, MD

## 2016-12-07 NOTE — Progress Notes (Signed)
Spoke with urology - ok to leave foley for 1 week, follow as outpatient

## 2016-12-07 NOTE — Progress Notes (Signed)
ID E note Kurt Hill is a 82 y.o. male with recent L patella knee surgery 07/4729 complicated by wound infection in Sept (with CNS) now largely immobile and at facility with L heel ulcer and open wound on L knee. No evidence of sepsis. Wounds relatively clean but I have cultured L heel. 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

## 2016-12-07 NOTE — Clinical Social Work Note (Signed)
Patient to discharge to [facility] via non-emergent EMS. Patient claimed that his sister-in-law told him that he could return to [facility]. The CSW informed the patient that [facility] had declined. The patient insisted that the CSW is "pulling a fast one" and "lying." The CSW called [facility] while in the room and with the patient's permission put them on speaker. The admissions coordinator informed him at that time that he was not able to return to the facility, and that they would like his family to retrieve his belongings. The patient then agreed to dc to the facility he had chosen. The facility and family are aware.  Santiago Bumpers, MSW, LCSW-A [phone]

## 2016-12-07 NOTE — Progress Notes (Signed)
This Probation officer received report from [place] at 1015, and assumed care of pt. With Tiffany,RN, pt received a 81fr foley catheter for urine retention, Dr. Lavetta Nielsen aware, pt returned 1057mls dark yellow urine. Foley to be left in place with outpatient urology follow up. Pt tolerated well. Dr. Lavetta Nielsen has rounded on pt since, placed order for pt discharge to [facility]. This Probation officer called report to [facility] and spoke to [place], [place], Librarian, academic. PIV removed from pt's LAC by this writer with catheter intact, pt tolerated well. Pt has complained of his hiccupps, stated he was given med for this last night, this writer could find no record of this. Pt unsure if he has mentioned this to a physician since he was admitted.

## 2016-12-07 NOTE — Progress Notes (Signed)
Patient bladder scanned, greater than 920mls found in bladder, MD paged awaiting return phone call at this time. Patient denies any pain or discomfort, states that he would like to talk with the doctor about some medication for his prostate.

## 2016-12-07 NOTE — Progress Notes (Signed)
Dr Jannifer Franklin notified of difficulty with foley catheter advancing.  Indwelling catheter order discontinued, I& O cath performed without difficulty with Donna Christen as second staff present on insertion.  550 ml amber urine returned.  Pt tolerated without complaints.

## 2016-12-07 NOTE — Progress Notes (Signed)
Pt dc'd via ems at 1350.

## 2016-12-07 NOTE — Progress Notes (Signed)
Pt complaining of "bladder pain" on this nurse's initial assessment.  Bladder scan performed and >700 ml noted.  Previous RN reports history of I & O cath for retention.  Dr. Jannifer Franklin notified.  Foley catheter ordered at this time.

## 2016-12-07 NOTE — Clinical Social Work Placement (Signed)
   CLINICAL SOCIAL WORK PLACEMENT  NOTE  Date:  12/07/2016  Patient Details  Name: Kurt Hill MRN: [id number] Date of Birth: [birth year]  Clinical Social Work is seeking post-discharge placement for this patient at the [place] level of care (*CSW will initial, date and re-position this form in  chart as items are completed):  Yes   Patient/family provided with [facility] Work Department's list of facilities offering this level of care within the geographic area requested by the patient (or if unable, by the patient's family).  Yes   Patient/family informed of their freedom to choose among providers that offer the needed level of care, that participate in Medicare, Medicaid or managed care program needed by the patient, have an available bed and are willing to accept the patient.  Yes   Patient/family informed of [facility]'s ownership interest in [facility] and [facility], as well as of the fact that they are under no obligation to receive care at these facilities.  PASRR submitted to EDS on       PASRR number received on       Existing PASRR number confirmed on 12/07/16     FL2 transmitted to all facilities in geographic area requested by pt/family on       FL2 transmitted to all facilities within larger geographic area on       Patient informed that his/her managed care company has contracts with or will negotiate with certain facilities, including the following:        Yes   Patient/family informed of bed offers received.  Patient chooses bed at  [facility])     Physician recommends and patient chooses bed at  [facility])    Patient to be transferred to  [facility]) on 12/07/16.  Patient to be transferred to facility by non-emergent EMS     Patient family notified on 12/07/16 of transfer.  Name of family member notified:  Valerie Roys     PHYSICIAN       Additional Comment:    _______________________________________________ Zettie Pho, LCSW 12/07/2016, 12:14 PM

## 2016-12-08 LAB — CULTURE, BLOOD (ROUTINE X 2)
CULTURE: NO GROWTH
CULTURE: NO GROWTH

## 2016-12-11 LAB — AEROBIC CULTURE  (SUPERFICIAL SPECIMEN)
GRAM STAIN: NONE SEEN
SPECIAL REQUESTS: NORMAL

## 2016-12-11 LAB — AEROBIC CULTURE W GRAM STAIN (SUPERFICIAL SPECIMEN)

## 2017-02-10 IMAGING — DX DG CHEST 1V
1 series · 1 of 1 positions shown · non-contrast
Comparison: 07/30/2016

CLINICAL DATA: Hypotension.

EXAM:
CHEST 1 VIEW

[chest ap]
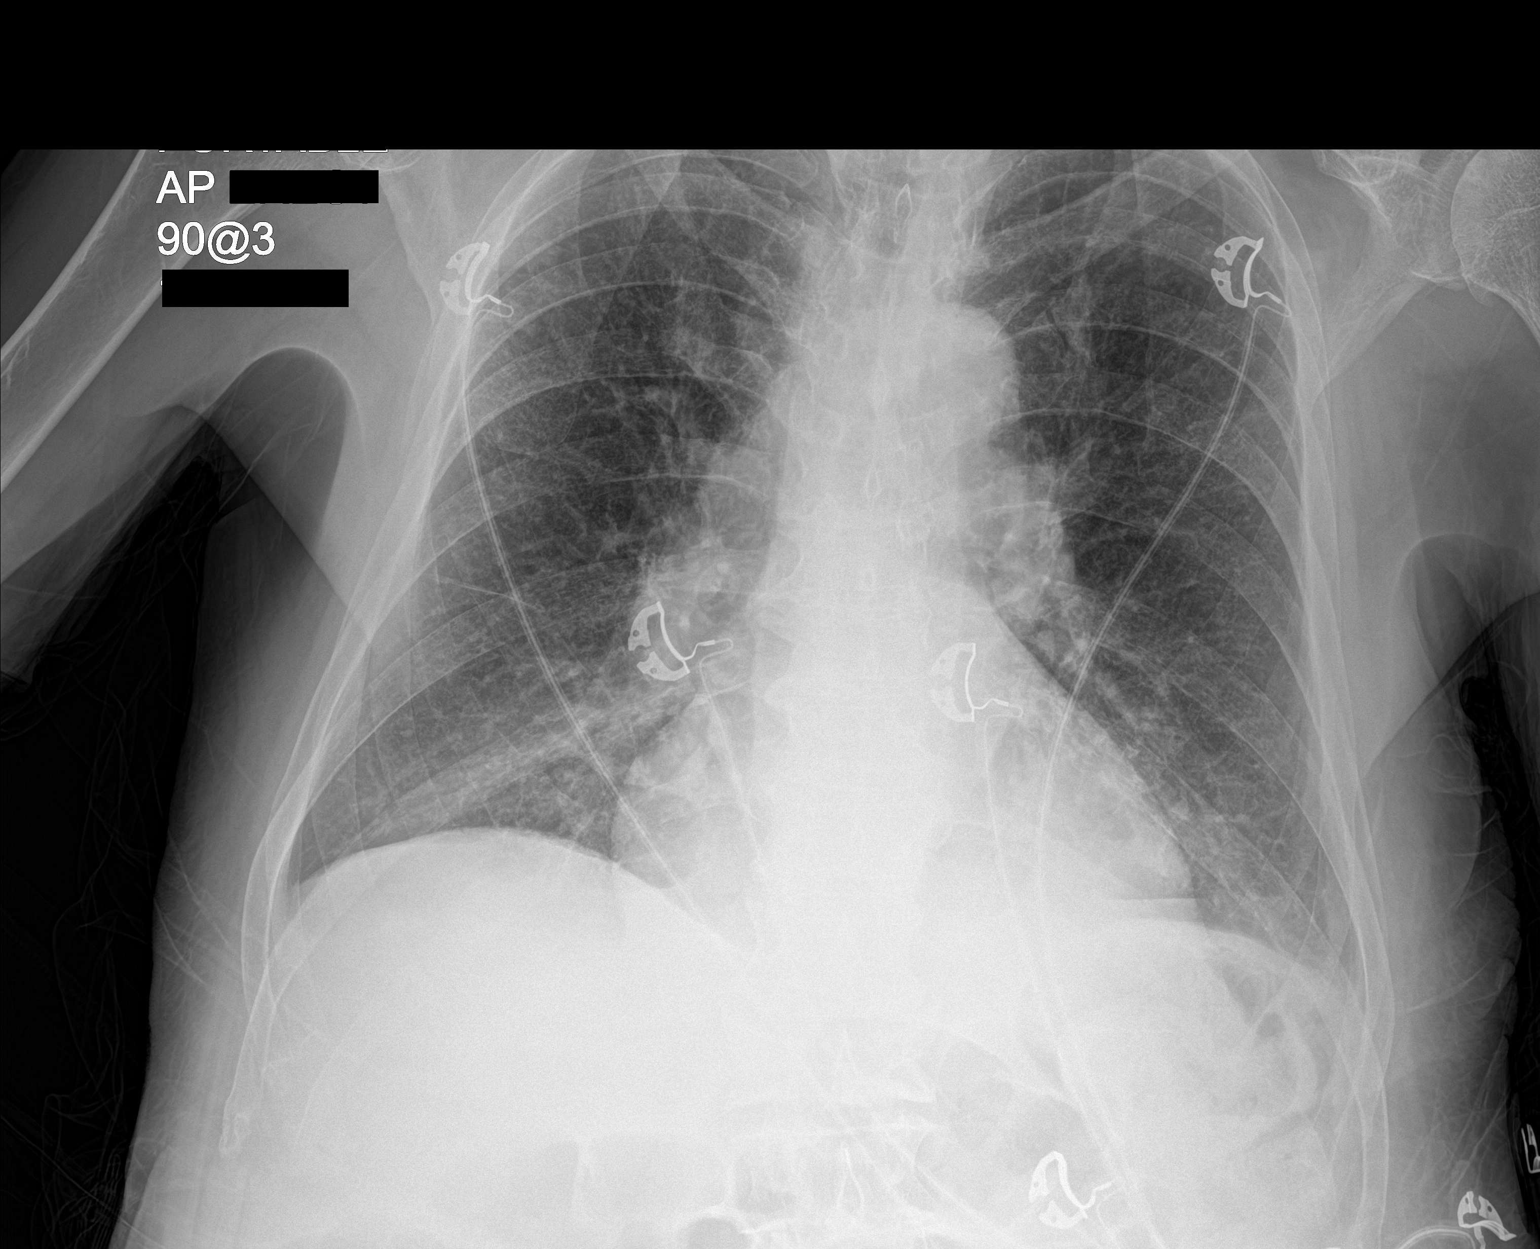

[1 of 1 positions shown; findings below may reference images not displayed]

FINDINGS: 7777 hours. The cardio pericardial silhouette is enlarged. There is
pulmonary vascular congestion without overt pulmonary edema.
Appearance of air-fluid level at the left base is probably artifact.
Interstitial markings are diffusely coarsened with chronic features.
Bones are diffusely demineralized. Telemetry leads overlie the
chest.
IMPRESSION: Cardiomegaly with chronic underlying interstitial changes.

Probable artifact at the left base although air-fluid level could
have this appearance. Dedicated PA and lateral chest x-ray, when
patient is able, recommended to further evaluate.

## 2017-02-21 ENCOUNTER — Encounter: Payer: Self-pay | Admitting: Internal Medicine

## 2017-02-21 ENCOUNTER — Inpatient Hospital Stay
Admission: AD | Admit: 2017-02-21 | Discharge: 2017-02-26 | DRG: 689 | Disposition: A | Discharge status: Skilled Nursing Facility | Payer: Medicare Other | Source: Ambulatory Visit | Attending: Internal Medicine | Admitting: Internal Medicine

## 2017-02-21 ENCOUNTER — Inpatient Hospital Stay: Payer: Medicare Other

## 2017-02-21 DIAGNOSIS — Z79899 Other long term (current) drug therapy: Secondary | ICD-10-CM

## 2017-02-21 DIAGNOSIS — B9562 Methicillin resistant Staphylococcus aureus infection as the cause of diseases classified elsewhere: Secondary | ICD-10-CM | POA: Diagnosis present

## 2017-02-21 DIAGNOSIS — Z7951 Long term (current) use of inhaled steroids: Secondary | ICD-10-CM | POA: Diagnosis not present

## 2017-02-21 DIAGNOSIS — Z79891 Long term (current) use of opiate analgesic: Secondary | ICD-10-CM

## 2017-02-21 DIAGNOSIS — J449 Chronic obstructive pulmonary disease, unspecified: Secondary | ICD-10-CM | POA: Diagnosis present

## 2017-02-21 DIAGNOSIS — N3000 Acute cystitis without hematuria: Secondary | ICD-10-CM | POA: Diagnosis present

## 2017-02-21 DIAGNOSIS — Z66 Do not resuscitate: Secondary | ICD-10-CM | POA: Diagnosis not present

## 2017-02-21 DIAGNOSIS — R634 Abnormal weight loss: Secondary | ICD-10-CM

## 2017-02-21 DIAGNOSIS — N132 Hydronephrosis with renal and ureteral calculous obstruction: Secondary | ICD-10-CM | POA: Diagnosis present

## 2017-02-21 DIAGNOSIS — R5381 Other malaise: Secondary | ICD-10-CM | POA: Diagnosis not present

## 2017-02-21 DIAGNOSIS — Z91041 Radiographic dye allergy status: Secondary | ICD-10-CM | POA: Diagnosis not present

## 2017-02-21 DIAGNOSIS — K5900 Constipation, unspecified: Secondary | ICD-10-CM | POA: Diagnosis present

## 2017-02-21 DIAGNOSIS — Z7189 Other specified counseling: Secondary | ICD-10-CM | POA: Diagnosis not present

## 2017-02-21 DIAGNOSIS — D638 Anemia in other chronic diseases classified elsewhere: Secondary | ICD-10-CM | POA: Diagnosis present

## 2017-02-21 DIAGNOSIS — Z681 Body mass index (BMI) 19 or less, adult: Secondary | ICD-10-CM

## 2017-02-21 DIAGNOSIS — E43 Unspecified severe protein-calorie malnutrition: Secondary | ICD-10-CM | POA: Diagnosis present

## 2017-02-21 DIAGNOSIS — I2510 Atherosclerotic heart disease of native coronary artery without angina pectoris: Secondary | ICD-10-CM | POA: Diagnosis present

## 2017-02-21 DIAGNOSIS — N201 Calculus of ureter: Secondary | ICD-10-CM

## 2017-02-21 DIAGNOSIS — I959 Hypotension, unspecified: Secondary | ICD-10-CM | POA: Diagnosis present

## 2017-02-21 DIAGNOSIS — Z515 Encounter for palliative care: Secondary | ICD-10-CM

## 2017-02-21 DIAGNOSIS — Z7982 Long term (current) use of aspirin: Secondary | ICD-10-CM | POA: Diagnosis not present

## 2017-02-21 DIAGNOSIS — G8929 Other chronic pain: Secondary | ICD-10-CM | POA: Diagnosis not present

## 2017-02-21 DIAGNOSIS — M549 Dorsalgia, unspecified: Secondary | ICD-10-CM | POA: Diagnosis not present

## 2017-02-21 DIAGNOSIS — N400 Enlarged prostate without lower urinary tract symptoms: Secondary | ICD-10-CM | POA: Diagnosis present

## 2017-02-21 DIAGNOSIS — Z85828 Personal history of other malignant neoplasm of skin: Secondary | ICD-10-CM | POA: Diagnosis not present

## 2017-02-21 DIAGNOSIS — I9589 Other hypotension: Secondary | ICD-10-CM | POA: Diagnosis present

## 2017-02-21 DIAGNOSIS — E039 Hypothyroidism, unspecified: Secondary | ICD-10-CM | POA: Diagnosis present

## 2017-02-21 DIAGNOSIS — Z882 Allergy status to sulfonamides status: Secondary | ICD-10-CM

## 2017-02-21 DIAGNOSIS — D649 Anemia, unspecified: Secondary | ICD-10-CM | POA: Diagnosis present

## 2017-02-21 DIAGNOSIS — R627 Adult failure to thrive: Secondary | ICD-10-CM

## 2017-02-21 DIAGNOSIS — R531 Weakness: Secondary | ICD-10-CM | POA: Diagnosis present

## 2017-02-21 DIAGNOSIS — L89620 Pressure ulcer of left heel, unstageable: Secondary | ICD-10-CM | POA: Diagnosis present

## 2017-02-21 DIAGNOSIS — N200 Calculus of kidney: Secondary | ICD-10-CM

## 2017-02-21 DIAGNOSIS — Z9889 Other specified postprocedural states: Secondary | ICD-10-CM | POA: Diagnosis not present

## 2017-02-21 DIAGNOSIS — M25569 Pain in unspecified knee: Secondary | ICD-10-CM | POA: Diagnosis not present

## 2017-02-21 LAB — URINALYSIS, COMPLETE (UACMP) WITH MICROSCOPIC
Bilirubin Urine: NEGATIVE
GLUCOSE, UA: NEGATIVE mg/dL
Ketones, ur: NEGATIVE mg/dL
NITRITE: NEGATIVE
PH: 5 (ref 5.0–8.0)
PROTEIN: NEGATIVE mg/dL
Specific Gravity, Urine: 1.02 (ref 1.005–1.030)
Squamous Epithelial / LPF: NONE SEEN

## 2017-02-21 LAB — COMPREHENSIVE METABOLIC PANEL
ALT: 21 U/L (ref 17–63)
ANION GAP: 6 (ref 5–15)
AST: 27 U/L (ref 15–41)
Albumin: 2.6 g/dL — ABNORMAL LOW (ref 3.5–5.0)
Alkaline Phosphatase: 66 U/L (ref 38–126)
BUN: 25 mg/dL — ABNORMAL HIGH (ref 6–20)
CHLORIDE: 101 mmol/L (ref 101–111)
CO2: 25 mmol/L (ref 22–32)
Calcium: 8.7 mg/dL — ABNORMAL LOW (ref 8.9–10.3)
Creatinine, Ser: 0.57 mg/dL — ABNORMAL LOW (ref 0.61–1.24)
Glucose, Bld: 103 mg/dL — ABNORMAL HIGH (ref 65–99)
Potassium: 4.3 mmol/L (ref 3.5–5.1)
SODIUM: 132 mmol/L — AB (ref 135–145)
Total Bilirubin: 0.4 mg/dL (ref 0.3–1.2)
Total Protein: 7 g/dL (ref 6.5–8.1)

## 2017-02-21 LAB — CBC
HCT: 26.7 % — ABNORMAL LOW (ref 40.0–52.0)
Hemoglobin: 9 g/dL — ABNORMAL LOW (ref 13.0–18.0)
MCH: 30.8 pg (ref 26.0–34.0)
MCHC: 33.5 g/dL (ref 32.0–36.0)
MCV: 91.8 fL (ref 80.0–100.0)
PLATELETS: 602 10*3/uL — AB (ref 150–440)
RBC: 2.91 MIL/uL — ABNORMAL LOW (ref 4.40–5.90)
RDW: 17.3 % — AB (ref 11.5–14.5)
WBC: 8.9 10*3/uL (ref 3.8–10.6)

## 2017-02-21 LAB — VITAMIN B12: VITAMIN B 12: 318 pg/mL (ref 180–914)

## 2017-02-21 LAB — FERRITIN: Ferritin: 217 ng/mL (ref 24–336)

## 2017-02-21 LAB — RETICULOCYTES
RBC.: 2.91 MIL/uL — ABNORMAL LOW (ref 4.40–5.90)
RETIC COUNT ABSOLUTE: 29.1 10*3/uL (ref 19.0–183.0)
Retic Ct Pct: 1 % (ref 0.4–3.1)

## 2017-02-21 LAB — IRON AND TIBC
IRON: 16 ug/dL — AB (ref 45–182)
SATURATION RATIOS: 7 % — AB (ref 17.9–39.5)
TIBC: 220 ug/dL — ABNORMAL LOW (ref 250–450)
UIBC: 204 ug/dL

## 2017-02-21 LAB — FOLATE: FOLATE: 9.9 ng/mL (ref >5.9)

## 2017-02-21 LAB — TSH: TSH: 22.418 u[IU]/mL — ABNORMAL HIGH (ref 0.350–4.500)

## 2017-02-21 LAB — MRSA PCR SCREENING: MRSA by PCR: POSITIVE — AB

## 2017-02-21 MED ORDER — ACETAMINOPHEN 325 MG PO TABS: 650.0000 mg | ORAL_TABLET | Freq: Four times a day (QID) | ORAL | Status: DC | PRN

## 2017-02-21 MED ORDER — MOMETASONE FURO-FORMOTEROL FUM 100-5 MCG/ACT IN AERO
2.0000 | INHALATION_SPRAY | Freq: Two times a day (BID) | RESPIRATORY_TRACT | Status: DC
  Administered 2017-02-22 – 2017-02-25 (×8): 2 via RESPIRATORY_TRACT
  Filled 2017-02-21: qty 8.8

## 2017-02-21 MED ORDER — ALBUTEROL SULFATE (2.5 MG/3ML) 0.083% IN NEBU: 2.5000 mg | INHALATION_SOLUTION | Freq: Four times a day (QID) | RESPIRATORY_TRACT | Status: DC | PRN

## 2017-02-21 MED ORDER — SERTRALINE HCL 25 MG PO TABS
25.0000 mg | ORAL_TABLET | Freq: Every day | ORAL | Status: DC
  Administered 2017-02-22 – 2017-02-26 (×5): 25 mg via ORAL
  Filled 2017-02-21 (×6): qty 1

## 2017-02-21 MED ORDER — ASPIRIN EC 81 MG PO TBEC
81.0000 mg | DELAYED_RELEASE_TABLET | Freq: Every morning | ORAL | Status: DC
  Administered 2017-02-22 – 2017-02-26 (×5): 81 mg via ORAL
  Filled 2017-02-21 (×5): qty 1

## 2017-02-21 MED ORDER — ALUM & MAG HYDROXIDE-SIMETH 200-200-20 MG/5ML PO SUSP
30.0000 mL | Freq: Four times a day (QID) | ORAL | Status: DC | PRN
Start: 2017-02-21 — End: 2017-02-26

## 2017-02-21 MED ORDER — SENNOSIDES-DOCUSATE SODIUM 8.6-50 MG PO TABS
2.0000 | ORAL_TABLET | Freq: Two times a day (BID) | ORAL | Status: DC
  Administered 2017-02-21 – 2017-02-26 (×10): 2 via ORAL
  Filled 2017-02-21 (×9): qty 2

## 2017-02-21 MED ORDER — TAMSULOSIN HCL 0.4 MG PO CAPS
0.4000 mg | ORAL_CAPSULE | Freq: Every day | ORAL | Status: DC
  Administered 2017-02-22 – 2017-02-26 (×5): 0.4 mg via ORAL
  Filled 2017-02-21 (×5): qty 1

## 2017-02-21 MED ORDER — FLUDROCORTISONE ACETATE 0.1 MG PO TABS
0.1000 mg | ORAL_TABLET | Freq: Every day | ORAL | Status: DC
  Administered 2017-02-22 – 2017-02-24 (×3): 0.1 mg via ORAL
  Filled 2017-02-21 (×3): qty 1

## 2017-02-21 MED ORDER — SODIUM CHLORIDE 0.9 % IV SOLN
INTRAVENOUS | Status: AC
  Administered 2017-02-22: 09:00:00 via INTRAVENOUS

## 2017-02-21 MED ORDER — COLLAGENASE 250 UNIT/GM EX OINT
TOPICAL_OINTMENT | Freq: Every day | CUTANEOUS | Status: DC
  Administered 2017-02-22 – 2017-02-26 (×5): via TOPICAL
  Filled 2017-02-21: qty 30

## 2017-02-21 MED ORDER — ONDANSETRON HCL 4 MG PO TABS: 4.0000 mg | ORAL_TABLET | Freq: Four times a day (QID) | ORAL | Status: DC | PRN

## 2017-02-21 MED ORDER — ONDANSETRON HCL 4 MG/2ML IJ SOLN: 4.0000 mg | Freq: Four times a day (QID) | INTRAMUSCULAR | Status: DC | PRN

## 2017-02-21 MED ORDER — FINASTERIDE 5 MG PO TABS
5.0000 mg | ORAL_TABLET | Freq: Every morning | ORAL | Status: DC
  Administered 2017-02-22 – 2017-02-26 (×5): 5 mg via ORAL
  Filled 2017-02-21 (×5): qty 1

## 2017-02-21 MED ORDER — ACETAMINOPHEN 650 MG RE SUPP: 650.0000 mg | Freq: Four times a day (QID) | RECTAL | Status: DC | PRN

## 2017-02-21 MED ORDER — BARIUM SULFATE 2.1 % PO SUSP
900.0000 mL | Freq: Once | ORAL | Status: AC
  Administered 2017-02-21: 900 mL via ORAL

## 2017-02-21 MED ORDER — METOCLOPRAMIDE HCL 10 MG PO TABS: 10.0000 mg | ORAL_TABLET | Freq: Three times a day (TID) | ORAL | Status: DC

## 2017-02-21 MED ORDER — MIDODRINE HCL 5 MG PO TABS
10.0000 mg | ORAL_TABLET | Freq: Three times a day (TID) | ORAL | Status: DC
  Administered 2017-02-22 – 2017-02-26 (×12): 10 mg via ORAL
  Filled 2017-02-21 (×11): qty 2

## 2017-02-21 MED ORDER — METOCLOPRAMIDE HCL 10 MG PO TABS
5.0000 mg | ORAL_TABLET | Freq: Three times a day (TID) | ORAL | Status: DC
  Administered 2017-02-21 – 2017-02-26 (×16): 5 mg via ORAL
  Filled 2017-02-21 (×17): qty 1

## 2017-02-21 MED ORDER — MEGESTROL ACETATE 40 MG PO TABS
40.0000 mg | ORAL_TABLET | Freq: Every day | ORAL | Status: DC
  Administered 2017-02-22 – 2017-02-26 (×5): 40 mg via ORAL
  Filled 2017-02-21 (×6): qty 1

## 2017-02-21 MED ORDER — ENOXAPARIN SODIUM 40 MG/0.4ML ~~LOC~~ SOLN
40.0000 mg | SUBCUTANEOUS | Status: DC
  Administered 2017-02-21: 40 mg via SUBCUTANEOUS
  Filled 2017-02-21: qty 0.4

## 2017-02-21 MED ORDER — CEPASTAT 14.5 MG MT LOZG
1.0000 | LOZENGE | OROMUCOSAL | Status: DC | PRN
  Filled 2017-02-21: qty 9

## 2017-02-21 MED ORDER — POLYETHYLENE GLYCOL 3350 17 G PO PACK
17.0000 g | PACK | Freq: Every day | ORAL | Status: DC
  Administered 2017-02-22 – 2017-02-26 (×5): 17 g via ORAL
  Filled 2017-02-21 (×5): qty 1

## 2017-02-21 MED ORDER — LEVOTHYROXINE SODIUM 100 MCG PO TABS
100.0000 ug | ORAL_TABLET | Freq: Every day | ORAL | Status: DC
  Administered 2017-02-22 – 2017-02-26 (×5): 100 ug via ORAL
  Filled 2017-02-21 (×5): qty 1

## 2017-02-21 MED ORDER — OXYCODONE HCL 5 MG PO TABS
5.0000 mg | ORAL_TABLET | ORAL | Status: DC | PRN
  Administered 2017-02-22 – 2017-02-26 (×10): 5 mg via ORAL
  Filled 2017-02-21 (×10): qty 1

## 2017-02-21 NOTE — H&P (Signed)
[place] [facility] [place] NAME: Kurt Hill    MR#:  [id number]  DATE OF BIRTH:  12-10-1931  DATE OF ADMISSION:  02/21/2017  PRIMARY CARE PHYSICIAN: Tracie Harrier, MD   REQUESTING/REFERRING PHYSICIAN: Dr. Ginette Pitman  CHIEF COMPLAINT:  No chief complaint on file.   HISTORY OF PRESENT ILLNESS:  Kurt Hill  is a 83 y.o. male with a known history of asthma/COPD not on home o2, BPH, CAD, chronic hypotension, hypothyroidism from [facility] rehab sent in from PCP office due to weakness, weight loss and hypotension. Over the last couple of months, patient has been very weak, decreased oral intake. His hypotension is getting worse. Went to see his PCP in the office last week and was noted to have drop in hemoglobin from baseline. He is being admitted for further workup of his weight loss and his weakness. Patient denies any fevers or chills. He says he can tolerate a pured diet well. He hasn't been drinking any supplemental shakes. Before his knee surgeries last year he was able to ambulate with a walker, but over the last 2 months he hasn't gotten out of bed.  PAST MEDICAL HISTORY:   Past Medical History:  Diagnosis Date  . Anemia   . Asthma   . BPH (benign prostatic hyperplasia)   . COPD (chronic obstructive pulmonary disease) (HCC)    not on home oxygen  . Coronary artery disease   . Hypotension   . Hypothyroidism   . Skin abnormalities   . Skin cancer     PAST SURGICAL HISTORY:   Past Surgical History:  Procedure Laterality Date  . APPENDECTOMY    . BONE RESECTION     on nose  . MOHS SURGERY    . ORIF PATELLA Left 07/30/2016   Procedure: OPEN REDUCTION INTERNAL (ORIF) FIXATION PATELLA;  Surgeon: Hessie Knows, MD;  Location: [facility] ORS;  Service: Orthopedics;  Laterality: Left;  . ORIF PATELLA Left 08/15/2016   Procedure: OPEN REDUCTION INTERNAL (ORIF) FIXATION PATELLA;  Surgeon: Hessie Knows, MD;  Location: [facility] ORS;  Service:  Orthopedics;  Laterality: Left;  . ORIF PATELLA Left 10/14/2016   Procedure: OPEN REDUCTION INTERNAL (ORIF) FIXATION PATELLA;  Surgeon: Hessie Knows, MD;  Location: [facility] ORS;  Service: Orthopedics;  Laterality: Left;  . TONSILLECTOMY      SOCIAL HISTORY:   Social History  Substance Use Topics  . Smoking status: Never Smoker  . Smokeless tobacco: Never Used     Comment: tried cigarettes when young- but never really smoked  . Alcohol use No    FAMILY HISTORY:   Family History  Problem Relation Age of Onset  . CAD Other   . Diabetes Mother     DRUG ALLERGIES:   Allergies  Allergen Reactions  . Ivp Dye [Iodinated Diagnostic Agents] Itching and Rash  . Sulfa Antibiotics Rash    REVIEW OF SYSTEMS:   Review of Systems  Constitutional: Positive for malaise/fatigue and weight loss. Negative for chills and fever.  HENT: Negative for ear discharge, ear pain, hearing loss and nosebleeds.   Eyes: Negative for blurred vision, double vision and photophobia.  Respiratory: Negative for cough, hemoptysis, shortness of breath and wheezing.   Cardiovascular: Negative for chest pain, palpitations, orthopnea and leg swelling.  Gastrointestinal: Negative for abdominal pain, constipation, diarrhea, melena, nausea and vomiting.  Genitourinary: Negative for dysuria and urgency.  Musculoskeletal: Negative for myalgias and neck pain.  Skin: Negative for rash.  Neurological: Positive for  dizziness and tremors. Negative for sensory change, speech change and focal weakness.  Endo/Heme/Allergies: Does not bruise/bleed easily.  Psychiatric/Behavioral: Negative for depression.    MEDICATIONS AT HOME:   Prior to Admission medications   Medication Sig Start Date End Date Taking? Authorizing Provider  acetaminophen (TYLENOL) 325 MG tablet Take by mouth every 4 (four) hours as needed.    Historical Provider, MD  albuterol (PROAIR HFA) 108 (90 Base) MCG/ACT inhaler Inhale 2 puffs into the lungs every 6  (six) hours as needed for wheezing or shortness of breath.  12/06/15 12/05/16  Historical Provider, MD  albuterol (PROVENTIL) (2.5 MG/3ML) 0.083% nebulizer solution Take 2.5 mg by nebulization every 6 (six) hours as needed for wheezing or shortness of breath.    Historical Provider, MD  alum & mag hydroxide-simeth (MYLANTA) 200-200-20 MG/5ML suspension Take 30 mLs by mouth every 6 (six) hours as needed for indigestion or heartburn.    Historical Provider, MD  aspirin EC 81 MG tablet Take 81 mg by mouth every morning.    Historical Provider, MD  bisacodyl (DULCOLAX) 5 MG EC tablet Take 1 tablet (5 mg total) by mouth daily as needed for moderate constipation. 12/07/16   Lytle Butte, MD  budesonide-formoterol [facility]) 80-4.5 MCG/ACT inhaler Inhale 2 puffs into the lungs 2 (two) times daily.    Historical Provider, MD  feeding supplement, ENSURE ENLIVE, (ENSURE ENLIVE) LIQD Take 237 mLs by mouth 2 (two) times daily between meals. 10/19/16   Bettey Costa, MD  finasteride (PROSCAR) 5 MG tablet Take 5 mg by mouth every morning. 03/12/16 03/12/17  Historical Provider, MD  fludrocortisone (FLORINEF) 0.1 MG tablet Take 0.1 mg by mouth daily.    Historical Provider, MD  lactulose (CHRONULAC) 10 GM/15ML solution Take 45 mLs (30 g total) by mouth daily as needed for moderate constipation. 09/17/16   Paulette Blanch, MD  levothyroxine (SYNTHROID, LEVOTHROID) 100 MCG tablet Take 1 tablet by mouth daily. 09/04/15   Historical Provider, MD  megestrol (MEGACE) 40 MG tablet Take 40 mg by mouth daily.    Historical Provider, MD  megestrol (MEGACE) 40 MG tablet Take 1 tablet (40 mg total) by mouth daily. 12/07/16   Lytle Butte, MD  metoCLOPramide (REGLAN) 10 MG tablet Take 10 mg by mouth 4 (four) times daily -  before meals and at bedtime.    Historical Provider, MD  midodrine (PROAMATINE) 5 MG tablet Take 1 tablet (5 mg total) by mouth 3 (three) times daily with meals. Patient taking differently: Take 10 mg by mouth 3  (three) times daily with meals.  10/19/16   Sital Mody, MD  ondansetron (ZOFRAN) 4 MG tablet Take 4 mg by mouth every 8 (eight) hours as needed for nausea or vomiting.    Historical Provider, MD  oxyCODONE (OXY IR/ROXICODONE) 5 MG immediate release tablet Take 1 tablet (5 mg total) by mouth every 4 (four) hours as needed for moderate pain or severe pain. 10/19/16   Bettey Costa, MD  oxyCODONE (OXY IR/ROXICODONE) 5 MG immediate release tablet Take 1 tablet (5 mg total) by mouth every 4 (four) hours as needed for moderate pain. 12/07/16   Lytle Butte, MD  polyethylene glycol [facility] Floria Raveling) packet Take 17 g by mouth daily. 10/20/16   Bettey Costa, MD  sennosides-docusate sodium (SENOKOT-S) 8.6-50 MG tablet Take 2 tablets by mouth 2 (two) times daily.    Historical Provider, MD  sertraline (ZOLOFT) 25 MG tablet Take 25 mg by mouth daily.  Historical Provider, MD  tamsulosin (FLOMAX) 0.4 MG CAPS capsule Take 1 capsule (0.4 mg total) by mouth daily. 12/07/16   Lytle Butte, MD  Vitamin D, Ergocalciferol, (DRISDOL) 50000 units CAPS capsule Take 1 capsule by mouth every 7 (seven) days. 09/04/15   Historical Provider, MD      VITAL SIGNS:  Weight 66.7 kg (147 lb 0.8 oz).  PHYSICAL EXAMINATION:   Physical Exam  GENERAL:  83 y.o.-year-old patient lying in the bed with no acute distress. Appears chronically ill EYES: Pupils equal, round, reactive to light and accommodation. No scleral icterus. Extraocular muscles intact.  HEENT: Head atraumatic, normocephalic. Oropharynx and nasopharynx clear.  NECK:  Supple, no jugular venous distention. No thyroid enlargement, no tenderness.  LUNGS: Normal breath sounds bilaterally, no wheezing, rales,rhonchi or crepitation. No use of accessory muscles of respiration. Decreased bibasilar breath sounds CARDIOVASCULAR: S1, S2 normal. No rubs, or gallops. 2/6 systolic murmur present ABDOMEN: Soft, nontender, nondistended. Bowel sounds present. No organomegaly or  mass.  EXTREMITIES: both lower extremities are contracted, right leg is pale and has 2+ edema.  No cyanosis, or clubbing.  NEUROLOGIC: Cranial nerves II through XII are intact. Muscle strength 4/5 in both upper extremities and 2/5 of lower extremities, . Sensation intact. Gait not checked. Left hand tremors noted. PSYCHIATRIC: The patient is alert and oriented x 3.  SKIN: No obvious rash, lesion, or ulcer.   LABORATORY PANEL:   CBC  Recent Labs Lab 02/21/17 1459  WBC 8.9  HGB 9.0*  HCT 26.7*  PLT 602*   ------------------------------------------------------------------------------------------------------------------  Chemistries  No results for input(s): NA, K, CL, CO2, GLUCOSE, BUN, CREATININE, CALCIUM, MG, AST, ALT, ALKPHOS, BILITOT in the last 168 hours.  Invalid input(s): GFRCGP ------------------------------------------------------------------------------------------------------------------  Cardiac Enzymes No results for input(s): TROPONINI in the last 168 hours. ------------------------------------------------------------------------------------------------------------------  RADIOLOGY:  No results found.  EKG:   Orders placed or performed during the hospital encounter of 12/03/16  . EKG 12-Lead  . EKG 12-Lead  . EKG 12-Lead  . EKG 12-Lead  . EKG  . EKG    IMPRESSION AND PLAN:   Kurt Hill  is a 83 y.o. male with a known history of asthma/COPD not on home o2, BPH, CAD, chronic hypotension, hypothyroidism from [facility] rehab sent in from PCP office due to weakness, weight loss and hypotension.  #1 Generalized weakness- chronically low BP and also anemia - generalized deconditioning - recent weight loss, consider CT abd and pel vis and CT chest- has contrast allergy- so non contrast ordered - Dietitian consult - Physical therapy- used to ambulate with a walker before- but not ambulating and bed bound for almost 2 months now -? Staggering gait,  mild resting tremors - now with hypotn- ? Need to be evaluated by parkinsons by neuro as outpatient - continue megace.  #2 Hypotension- ? Autonomic dysfunction vs hypovolemic - IV fluids, check orthostatics - continue midodrine and florinef  #3 Anemia- anemia of chronic disease. Baseline around 9.5 to 10 - no active bleeding. Patient recollects that his last colonoscopy was normal was almost 5 years ago - check anemia labs. Stable hb, no indication for transfusion at this time  #4 Hypothyroidism- on synthroid, check TSH  #5 COPD/ asthma- stable, continue inhalers  #6 DVT Prophylaxis- lovenox   Patient is from [facility] If not improving, consider palliative care   All the records are reviewed and case discussed with ED provider. Management plans discussed with the patient, family and they are  in agreement.  CODE STATUS: Full Code  TOTAL TIME TAKING CARE OF THIS PATIENT: 50 minutes.    Gladstone Lighter M.D on 02/21/2017 at 3:51 PM  Between 7am to 6pm - Pager - [phone]  After 6pm go to [email or web address] - password [email or web address] [place] [facility]  Office  [phone]  CC: Primary care physician; Tracie Harrier, MD

## 2017-02-21 NOTE — Clinical Social Work Note (Addendum)
Clinical Social Work Assessment  Patient Details  Name: Kurt Hill MRN: [id number] Date of Birth: 12-13-1931  Date of referral:  02/21/17               Reason for consult:  Other (Comment Required) (From [facility] )                Permission sought to share information with:  Chartered certified accountant granted to share information::  Yes, Verbal Permission Granted  Name::      Company secretary::   [place]   Relationship::     Contact Information:     Housing/Transportation Living arrangements for the past 2 months:  [place] of Information:  Patient, Facility Patient Interpreter Needed:  None Criminal Activity/Legal Involvement Pertinent to Current Situation/Hospitalization:  No - Comment as needed Significant Relationships:  Adult Children Lives with:  Facility Resident Do you feel safe going back to the place where you live?  Yes Need for family participation in patient care:  Yes (Comment)  Care giving concerns:  Patient was a direct admit to [facility] from an outpatient doctor's office. Patient has been at [facility] since 12/07/16.     Social Worker assessment / plan:  [place] representative (CSW) met with patient alone at bedside to discuss D/C plan. Patient was alert and oriented and was laying in the bed. CSW is familiar with patient from previous admission. Patient reported that he has been staying at [facility] since December 2017 and is out of medicare days. Patient reported that he makes too much money to qualify for medicaid and stated that he is paying [facility] out of his pocket. Patient asked if he would have to pay [facility] while is he at [facility]. CSW explained that generally the facility will reach out to patient or the family and ask if they want to pay for a bed hold while patient is in the hospital. CSW explained that patient would have to work that  out directly with [facility]. Patient verbalized his understanding and is agreeable to return to [facility] once discharged. Before going into [facility] in December patient has at [facility]. Patient reported that before he went in to the nursing homes he was living in [place] mostly alone and his son who is a truck driver would check on him occasionally. CSW attempted to contact [facility] admissions coordinator at [facility] however he did not answer and a voicemail was left. CSW will continue to follow and assist as needed.   Per [facility] admissions coordinator at [facility] patient can return when stable.    Employment status:  Retired Forensic scientist:  Medicare PT Recommendations:  Not assessed at this time [place] / Referral to community resources:  [place]  Patient/Family's Response to care:  Patient is agreeable to return to [facility]. CSW is waiting to hear from [facility] to confirm they can accept him back.   Patient/Family's Understanding of and Emotional Response to Diagnosis, Current Treatment, and Prognosis:  Patient was very pleasant and thanked CSW for visit.   Emotional Assessment Appearance:  Appears stated age Attitude/Demeanor/Rapport:    Affect (typically observed):  Accepting, Adaptable, Pleasant Orientation:  Oriented to Self, Oriented to Place, Oriented to  Time, Oriented to Situation Alcohol / Substance use:  Not Applicable Psych involvement (Current and /or in the community):  No (Comment)  Discharge Needs  Concerns to be addressed:  Discharge Planning Concerns Readmission within the last 30 days:  No Current discharge risk:  Dependent with Mobility Barriers to Discharge:  Continued Medical Work up   [employer], [name], LCSW 02/21/2017, 3:17 PM

## 2017-02-21 NOTE — Consult Note (Signed)
[place] Nurse wound consult note Reason for Consult:Unstageable pressure injury to left heel.  Denuded abrasions to bilateral lower legs. Dry and intact. Scabbed Wound type:pressure injury Abrasions Pressure Injury POA: Yes Measurement:left heel 3 cm x 2 cm unable to visualize wound bed. 100% slough Wound TXM:IWOEHO Drainage (amount, consistency, odor) moderate musty drainage. Periwound:boggy heel Dressing procedure/placement/frequency:Keep left heel in PRevalon boot when not performing wound care Cleanse left heel with NS and pat dry.  Apply Santyl to wound bed.  Cover with NS moist gauze.  Cover with 4x4 gauze and kerlix/tape.  Change daily.  Will not follow at this time.  Please re-consult if needed.  Domenic Moras RN BSN [place] Pager [phone]

## 2017-02-22 ENCOUNTER — Inpatient Hospital Stay: Payer: Medicare Other

## 2017-02-22 LAB — IRON AND TIBC
IRON: 23 ug/dL — AB (ref 45–182)
SATURATION RATIOS: 12 % — AB (ref 17.9–39.5)
TIBC: 193 ug/dL — ABNORMAL LOW (ref 250–450)
UIBC: 170 ug/dL

## 2017-02-22 LAB — CBC
HEMATOCRIT: 23.6 % — AB (ref 40.0–52.0)
HEMOGLOBIN: 8 g/dL — AB (ref 13.0–18.0)
MCH: 31.2 pg (ref 26.0–34.0)
MCHC: 33.9 g/dL (ref 32.0–36.0)
MCV: 92.1 fL (ref 80.0–100.0)
Platelets: 551 10*3/uL — ABNORMAL HIGH (ref 150–440)
RBC: 2.56 MIL/uL — ABNORMAL LOW (ref 4.40–5.90)
RDW: 17.3 % — ABNORMAL HIGH (ref 11.5–14.5)
WBC: 6.5 10*3/uL (ref 3.8–10.6)

## 2017-02-22 LAB — BASIC METABOLIC PANEL
ANION GAP: 6 (ref 5–15)
BUN: 21 mg/dL — ABNORMAL HIGH (ref 6–20)
CALCIUM: 8.1 mg/dL — AB (ref 8.9–10.3)
CO2: 26 mmol/L (ref 22–32)
Chloride: 102 mmol/L (ref 101–111)
Creatinine, Ser: 0.59 mg/dL — ABNORMAL LOW (ref 0.61–1.24)
Glucose, Bld: 82 mg/dL (ref 65–99)
POTASSIUM: 3.6 mmol/L (ref 3.5–5.1)
SODIUM: 134 mmol/L — AB (ref 135–145)

## 2017-02-22 LAB — RETICULOCYTES
RBC.: 2.54 MIL/uL — ABNORMAL LOW (ref 4.40–5.90)
RETIC COUNT ABSOLUTE: 25.4 10*3/uL (ref 19.0–183.0)
Retic Ct Pct: 1 % (ref 0.4–3.1)

## 2017-02-22 LAB — FERRITIN: Ferritin: 211 ng/mL (ref 24–336)

## 2017-02-22 LAB — VITAMIN B12: VITAMIN B 12: 331 pg/mL (ref 180–914)

## 2017-02-22 LAB — FOLATE: FOLATE: 12.5 ng/mL (ref >5.9)

## 2017-02-22 MED ORDER — BISACODYL 5 MG PO TBEC
10.0000 mg | DELAYED_RELEASE_TABLET | Freq: Every day | ORAL | Status: DC
  Administered 2017-02-22: 10 mg via ORAL
  Filled 2017-02-22: qty 2

## 2017-02-22 MED ORDER — TRAMADOL HCL 50 MG PO TABS: 50.0000 mg | ORAL_TABLET | Freq: Four times a day (QID) | ORAL | Status: DC | PRN

## 2017-02-22 MED ORDER — DEXTROSE 5 % IV SOLN
1.0000 g | INTRAVENOUS | Status: DC
  Administered 2017-02-22 – 2017-02-24 (×3): 1 g via INTRAVENOUS
  Filled 2017-02-22 (×3): qty 10

## 2017-02-22 NOTE — NC FL2 (Signed)
[place] LEVEL OF CARE SCREENING TOOL     IDENTIFICATION  Patient Name: Kurt Hill Birthdate: 1932/08/12 Sex: male Admission Date (Current Location): 02/21/2017  [place] and [place] Number:  Engineering geologist and Address:  [facility], [address], [place],  [address]      Provider Number: [id number]  Attending Physician Name and Address:  Gladstone Lighter, MD  Relative Name and Phone Number:       Current Level of Care: Hospital Recommended Level of Care: [place] Prior Approval Number:    Date Approved/Denied:   PASRR Number:  [id number] )  Discharge Plan: SNF    Current Diagnoses: Patient Active Problem List   Diagnosis Date Noted  . Anemia 02/21/2017  . Protein-calorie malnutrition, severe 12/04/2016  . Pressure injury of skin 10/13/2016  . Cellulitis of left leg 08/30/2016  . Post op infection 08/30/2016  . Cellulitis 08/30/2016  . Left patella fracture 08/15/2016  . Patella fracture 07/30/2016  . COPD exacerbation ([place]) 12/14/2015    Orientation RESPIRATION BLADDER Height & Weight     Self, Time, Place  Normal Incontinent Weight: 125 lb 4.8 oz (56.8 kg) Height:  5\' 10"  (177.8 cm)  BEHAVIORAL SYMPTOMS/MOOD NEUROLOGICAL BOWEL NUTRITION STATUS   (none)  (none) Incontinent Diet (Diet: DYS 1 )  AMBULATORY STATUS COMMUNICATION OF NEEDS Skin   Extensive Assist Verbally PU Stage and Appropriate Care (Pressuer ulcer stage 1 on coccyx. Unstagable pressure ulcer on left heel. )                       Personal Care Assistance Level of Assistance  Bathing, Feeding, Dressing Bathing Assistance: Limited assistance Feeding assistance: Independent Dressing Assistance: Limited assistance     Functional Limitations Info  Sight, Hearing, Speech Sight Info: Adequate Hearing Info: Adequate Speech Info: Adequate    SPECIAL CARE FACTORS FREQUENCY  PT (By licensed PT)     PT Frequency:   (4-5)              Contractures      Additional Factors Info  Code Status, Allergies, Isolation Precautions Code Status Info:  (Full Code. ) Allergies Info:  (Ivp Dye Iodinated Diagnostic Agents, Sulfa Antibiotics)     Isolation Precautions Info:  (MRSA )     Current Medications (02/22/2017):  This is the current hospital active medication list Current Facility-Administered Medications  Medication Dose Route Frequency Provider Last Rate Last Dose  . 0.9 %  sodium chloride infusion   Intravenous Continuous Gladstone Lighter, MD 75 mL/hr at 02/22/17 0921    . acetaminophen (TYLENOL) tablet 650 mg  650 mg Oral Q6H PRN Gladstone Lighter, MD       Or  . acetaminophen (TYLENOL) suppository 650 mg  650 mg Rectal Q6H PRN Gladstone Lighter, MD      . albuterol (PROVENTIL) (2.5 MG/3ML) 0.083% nebulizer solution 2.5 mg  2.5 mg Nebulization Q6H PRN Gladstone Lighter, MD      . alum & mag hydroxide-simeth (MAALOX/MYLANTA) 200-200-20 MG/5ML suspension 30 mL  30 mL Oral Q6H PRN Gladstone Lighter, MD      . aspirin EC tablet 81 mg  81 mg Oral q morning - 10a Gladstone Lighter, MD   81 mg at 02/22/17 1023  . bisacodyl (DULCOLAX) EC tablet 10 mg  10 mg Oral Daily Gladstone Lighter, MD   10 mg at 02/22/17 1024  . cefTRIAXone (ROCEPHIN) 1 g in dextrose 5 % 50  mL IVPB  1 g Intravenous Q24H Gladstone Lighter, MD   1 g at 02/22/17 0900  . collagenase (SANTYL) ointment   Topical Daily Gladstone Lighter, MD      . finasteride (PROSCAR) tablet 5 mg  5 mg Oral q morning - 10a Gladstone Lighter, MD   5 mg at 02/22/17 1023  . fludrocortisone (FLORINEF) tablet 0.1 mg  0.1 mg Oral Daily Gladstone Lighter, MD   0.1 mg at 02/22/17 1023  . levothyroxine (SYNTHROID, LEVOTHROID) tablet 100 mcg  100 mcg Oral Daily Gladstone Lighter, MD   100 mcg at 02/22/17 0536  . megestrol (MEGACE) tablet 40 mg  40 mg Oral Daily Gladstone Lighter, MD   40 mg at 02/22/17 1024  . metoCLOPramide (REGLAN) tablet 5 mg  5 mg Oral TID AC &  HS Gladstone Lighter, MD   5 mg at 02/22/17 0900  . midodrine (PROAMATINE) tablet 10 mg  10 mg Oral TID WC Gladstone Lighter, MD   10 mg at 02/22/17 0900  . mometasone-formoterol (DULERA) 100-5 MCG/ACT inhaler 2 puff  2 puff Inhalation BID Gladstone Lighter, MD   2 puff at 02/22/17 1025  . ondansetron (ZOFRAN) tablet 4 mg  4 mg Oral Q6H PRN Gladstone Lighter, MD       Or  . ondansetron (ZOFRAN) injection 4 mg  4 mg Intravenous Q6H PRN Gladstone Lighter, MD      . oxyCODONE (Oxy IR/ROXICODONE) immediate release tablet 5 mg  5 mg Oral Q4H PRN Gladstone Lighter, MD   5 mg at 02/22/17 1023  . phenol-menthol (CEPASTAT) lozenge 1 lozenge  1 lozenge Buccal PRN Gladstone Lighter, MD      . polyethylene glycol (MIRALAX / GLYCOLAX) packet 17 g  17 g Oral Daily Gladstone Lighter, MD   17 g at 02/22/17 1025  . senna-docusate (Senokot-S) tablet 2 tablet  2 tablet Oral BID Gladstone Lighter, MD   2 tablet at 02/22/17 1024  . sertraline (ZOLOFT) tablet 25 mg  25 mg Oral Daily Gladstone Lighter, MD   25 mg at 02/22/17 1023  . tamsulosin (FLOMAX) capsule 0.4 mg  0.4 mg Oral Daily Gladstone Lighter, MD   0.4 mg at 02/22/17 1023  . traMADol (ULTRAM) tablet 50 mg  50 mg Oral Q6H PRN Gladstone Lighter, MD         Discharge Medications: Please see discharge summary for a list of discharge medications.  Relevant Imaging Results:  Relevant Lab Results:   Additional Information  (SSN: [id number])  [name], [name], LCSW

## 2017-02-22 NOTE — Consult Note (Signed)
I have been asked to see the patient by Dr. Gladstone Lighter, for evaluation and management of left hydronephrosis secondary to a 5 mm UVJ stone.  History of present illness: 83 year old white male with significant comorbidities who presented to the hospital yesterday afternoon with weakness, weight loss, and low blood pressure.  He has significant comorbidities including COPD, coronary artery disease, hypotension and failure to thrive. He had been in a skilled nursing facility.  The patient underwent a CT scan for weight loss and he was noted to have a small left UVJ stone (down near the bladder) with associated proximal hydroureteronephrosis. At the time, the patient also had a very distended bladder and a large amount of stool within his rectum. He was complaining of intermittent pain, but currently is having no pain at all. He denies any fevers or chills. He denies any progressive voiding symptoms. Patient does have a history of incomplete bladder emptying.  A urinalysis demonstrated white blood cells, few red blood cells, but no evidence of a urinary tract infection. His white blood cell count was 6.5, his renal function was normal.  Review of systems: A 12 point comprehensive review of systems was obtained and is negative unless otherwise stated in the history of present illness.  Patient Active Problem List   Diagnosis Date Noted  . Anemia 02/21/2017  . Protein-calorie malnutrition, severe 12/04/2016  . Pressure injury of skin 10/13/2016  . Cellulitis of left leg 08/30/2016  . Post op infection 08/30/2016  . Cellulitis 08/30/2016  . Left patella fracture 08/15/2016  . Patella fracture 07/30/2016  . COPD exacerbation ([place]) 12/14/2015    No current facility-administered medications on file prior to encounter.    Current Outpatient Prescriptions on File Prior to Encounter  Medication Sig Dispense Refill  . acetaminophen (TYLENOL) 325 MG tablet Take by mouth every 4 (four) hours as  needed.    Marland Kitchen albuterol (PROAIR HFA) 108 (90 Base) MCG/ACT inhaler Inhale 2 puffs into the lungs every 6 (six) hours as needed for wheezing or shortness of breath.     Marland Kitchen albuterol (PROVENTIL) (2.5 MG/3ML) 0.083% nebulizer solution Take 2.5 mg by nebulization every 6 (six) hours as needed for wheezing or shortness of breath.    Marland Kitchen alum & mag hydroxide-simeth (MYLANTA) 789-381-01 MG/5ML suspension Take 30 mLs by mouth every 6 (six) hours as needed for indigestion or heartburn.    Marland Kitchen aspirin EC 81 MG tablet Take 81 mg by mouth every morning.    . bisacodyl (DULCOLAX) 5 MG EC tablet Take 1 tablet (5 mg total) by mouth daily as needed for moderate constipation. 30 tablet 0  . budesonide-formoterol (SYMBICORT) 80-4.5 MCG/ACT inhaler Inhale 2 puffs into the lungs 2 (two) times daily.    . feeding supplement, ENSURE ENLIVE, (ENSURE ENLIVE) LIQD Take 237 mLs by mouth 2 (two) times daily between meals. 237 mL 12  . finasteride (PROSCAR) 5 MG tablet Take 5 mg by mouth every morning.    . fludrocortisone (FLORINEF) 0.1 MG tablet Take 0.1 mg by mouth daily.    Marland Kitchen lactulose (CHRONULAC) 10 GM/15ML solution Take 45 mLs (30 g total) by mouth daily as needed for moderate constipation. 473 mL 0  . levothyroxine (SYNTHROID, LEVOTHROID) 100 MCG tablet Take 1 tablet by mouth daily.    . megestrol (MEGACE) 40 MG tablet Take 40 mg by mouth daily.    . megestrol (MEGACE) 40 MG tablet Take 1 tablet (40 mg total) by mouth daily. 30 tablet 0  . metoCLOPramide (  REGLAN) 10 MG tablet Take 10 mg by mouth 4 (four) times daily -  before meals and at bedtime.    . midodrine (PROAMATINE) 5 MG tablet Take 1 tablet (5 mg total) by mouth 3 (three) times daily with meals. (Patient taking differently: Take 10 mg by mouth 3 (three) times daily with meals. ) 90 tablet 0  . ondansetron (ZOFRAN) 4 MG tablet Take 4 mg by mouth every 8 (eight) hours as needed for nausea or vomiting.    Marland Kitchen oxyCODONE (OXY IR/ROXICODONE) 5 MG immediate release tablet  Take 1 tablet (5 mg total) by mouth every 4 (four) hours as needed for moderate pain or severe pain. 30 tablet 0  . oxyCODONE (OXY IR/ROXICODONE) 5 MG immediate release tablet Take 1 tablet (5 mg total) by mouth every 4 (four) hours as needed for moderate pain. 30 tablet 0  . polyethylene glycol (MIRALAX / GLYCOLAX) packet Take 17 g by mouth daily. 14 each 0  . sennosides-docusate sodium (SENOKOT-S) 8.6-50 MG tablet Take 2 tablets by mouth 2 (two) times daily.    . sertraline (ZOLOFT) 25 MG tablet Take 25 mg by mouth daily.    . tamsulosin (FLOMAX) 0.4 MG CAPS capsule Take 1 capsule (0.4 mg total) by mouth daily. 30 capsule   . Vitamin D, Ergocalciferol, (DRISDOL) 50000 units CAPS capsule Take 1 capsule by mouth every 7 (seven) days.      Past Medical History:  Diagnosis Date  . Anemia   . Asthma   . BPH (benign prostatic hyperplasia)   . COPD (chronic obstructive pulmonary disease) (HCC)    not on home oxygen  . Coronary artery disease   . Hypotension   . Hypothyroidism   . Skin abnormalities   . Skin cancer     Past Surgical History:  Procedure Laterality Date  . APPENDECTOMY    . BONE RESECTION     on nose  . MOHS SURGERY    . ORIF PATELLA Left 07/30/2016   Procedure: OPEN REDUCTION INTERNAL (ORIF) FIXATION PATELLA;  Surgeon: Hessie Knows, MD;  Location: [facility] OR[facility];  Service: Orthopedics;  Laterality: Left;  . ORIF PATELLA Left 08/15/2016   Procedure: OPEN REDUCTION INTERNAL (ORIF) FIXATION PATELLA;  Surgeon: Hessie Knows, MD;  Location: [facility] OR[facility];  Service: Orthopedics;  Laterality: Left;  . ORIF PATELLA Left 10/14/2016   Procedure: OPEN REDUCTION INTERNAL (ORIF) FIXATION PATELLA;  Surgeon: Hessie Knows, MD;  Location: [facility] OR[facility];  Service: Orthopedics;  Laterality: Left;  . TONSILLECTOMY      Social History  Substance Use Topics  . Smoking status: Never Smoker  . Smokeless tobacco: Never Used     Comment: tried cigarettes when young- but never really smoked  . Alcohol use No     Family History  Problem Relation Age of Onset  . CAD Other   . Diabetes Mother     PE: Vitals:   02/21/17 1400 02/21/17 1955 02/22/17 0500  BP:  121/64 130/66  Pulse:  78 80  Resp:   18  Temp:  97.7 F (36.5 C) 98.4 F (36.9 C)  TempSrc:  Axillary Axillary  SpO2:  98% 98%  Weight: 66.7 kg (147 lb 0.8 oz) 56.8 kg (125 lb 4.8 oz)   Height:  5\' 10"  (1.778 m)    Patient appears to be in no acute distress  patient is alert and oriented x3 Atraumatic normocephalic head No cervical or supraclavicular lymphadenopathy appreciated No increased work of breathing, no audible wheezes/rhonchi Regular sinus rhythm/rate  Abdomen is soft, nontender, nondistended, no CVA or suprapubic tenderness Lower extremities are symmetric without appreciable edema Grossly neurologically intact No identifiable skin lesions   Recent Labs  02/21/17 1459 02/22/17 0420  WBC 8.9 6.5  HGB 9.0* 8.0*  HCT 26.7* 23.6*    Recent Labs  02/21/17 1459 02/22/17 0420  NA 132* 134*  K 4.3 3.6  CL 101 102  CO2 25 26  GLUCOSE 103* 82  BUN 25* 21*  CREATININE 0.57* 0.59*  CALCIUM 8.7* 8.1*   No results for input(s): LABPT, INR in the last 72 hours. No results for input(s): LABURIN in the last 72 hours. Results for orders placed or performed during the hospital encounter of 02/21/17  MRSA PCR Screening     Status: Abnormal   Collection Time: 02/21/17  3:48 PM  Result Value Ref Range Status   MRSA by PCR POSITIVE (A) NEGATIVE Final    Comment:        The GeneXpert MRSA Assay (FDA approved for NASAL specimens only), is one component of a comprehensive MRSA colonization surveillance program. It is not intended to diagnose MRSA infection nor to guide or monitor treatment for MRSA infections. RESULT CALLED TO, READ BACK BY AND VERIFIED WITH: Yves Dill RN AT 4081 02/21/17 MSS.   Aerobic/Anaerobic Culture (surgical/deep wound)     Status: None (Preliminary result)   Collection Time: 02/21/17   7:50 PM  Result Value Ref Range Status   Specimen Description ULCER  Final   Special Requests NONE  Final   Gram Stain   Final    NO WBC SEEN FEW GRAM POSITIVE RODS RARE GRAM POSITIVE COCCI IN PAIRS FEW SQUAMOUS EPITHELIAL CELLS PRESENT Performed at [facility], [place] [address] [place], [place] [address]    Culture PENDING  Incomplete   Report Status PENDING  Incomplete    Imaging: I have independently reviewed the patient's CT scan demonstrating a 2.5x 3.66mm left distal ureteral stone with associated hydronephrosis. The patient has bilateral nonobstructing stones. He has a very distended bladder as well as a very full rectum.  Imp: The patient has an asymptomatic left distal ureteral stone that he should be easily able to pass with medical expulsion therapy. The patient has a very distended bladder, with difficulty emptying his bladder completely. The patient is severely constipated.  Recommendations: #1: Distal ureteral stone-recommend conservative management, although I don't think Flomax would be a good option for the patient given his history of hypotension. Would treat him with PSA. Medication as well as nausea medication if necessary. We can repeat KUB in the a.m. to see if his past the stone.  #2: Distended bladder-urinary retention: I recommend that Foley catheter be placed for the patient out from with his difficulty urinating. I suspect this will also allow him to more easily pass the stone by reducing the pressure in his bladder and thus the transmural ureter. I will leave his Foley catheter for a few days and then give him a voiding trial.  #3: Constipation: The patient is severely constipated and needs an aggressive bowel regimen including an enema and perhaps disimpaction. This will also help with his voiding symptoms as well as passage of the stone.  We'll follow up with the patient in the morning. I ordered a KUB. I have not ordered a Foley catheter or a bowel  regimen, we'll leave this to the primary provider.  Thank you for involving me in this patient's care, I will continue to follow along  .  Louis Meckel W

## 2017-02-22 NOTE — Progress Notes (Signed)
Pharmacy Antibiotic Note  Kurt Hill is a 83 y.o. male admitted on 02/21/2017 with UTI.  Pharmacy has been consulted for Ceftriaxone dosing.  Patient from SNF.   Plan: Will begin Ceftriaxone 1 gram IV Q24h    Height: 5\' 10"  (177.8 cm) Weight: 125 lb 4.8 oz (56.8 kg) IBW/kg (Calculated) : 73  Temp (24hrs), Avg:98.1 F (36.7 C), Min:97.7 F (36.5 C), Max:98.4 F (36.9 C)   Recent Labs Lab 02/21/17 1459 02/22/17 0420  WBC 8.9 6.5  CREATININE 0.57* 0.59*    Estimated Creatinine Clearance: 57.2 mL/min (A) (by C-G formula based on SCr of 0.59 mg/dL (L)).    Allergies  Allergen Reactions  . Ivp Dye [Iodinated Diagnostic Agents] Itching and Rash  . Sulfa Antibiotics Rash    Antimicrobials this admission: CTX  3/17 >>       >>    Dose adjustments this admission:    Microbiology results:   BCx:   3/16 UCx:  pending    Sputum:    3/16 MRSA PCR: positive  Thank you for allowing pharmacy to be a part of this patient's care.  Celestia Duva A 02/22/2017 7:53 AM

## 2017-02-22 NOTE — Progress Notes (Signed)
[facility] [place] NAME: Kurt Hill    MR#:  [id number]  DATE OF BIRTH:  08-15-1932  SUBJECTIVE:  CHIEF COMPLAINT:  No chief complaint on file.   REVIEW OF SYSTEMS:  Review of Systems  Constitutional: Positive for malaise/fatigue. Negative for chills and fever.  HENT: Negative for ear discharge, ear pain, hearing loss and nosebleeds.   Eyes: Negative for blurred vision.  Respiratory: Negative for cough, shortness of breath and wheezing.   Cardiovascular: Negative for chest pain, palpitations and leg swelling.  Gastrointestinal: Positive for abdominal pain. Negative for constipation, diarrhea, nausea and vomiting.  Genitourinary: Negative for dysuria.  Musculoskeletal: Positive for myalgias.  Neurological: Negative for dizziness, sensory change, speech change, focal weakness, seizures and headaches.    DRUG ALLERGIES:   Allergies  Allergen Reactions  . Ivp Dye [Iodinated Diagnostic Agents] Itching and Rash  . Sulfa Antibiotics Rash    VITALS:  Blood pressure 130/66, pulse 80, temperature 98.4 F (36.9 C), temperature source Axillary, resp. rate 18, height 5\' 10"  (1.778 m), weight 56.8 kg (125 lb 4.8 oz), SpO2 98 %.  PHYSICAL EXAMINATION:  Physical Exam  GENERAL:  83 y.o.-year-old patient lying in the bed with no acute distress. Appears chronically ill EYES: Pupils equal, round, reactive to light and accommodation. No scleral icterus. Extraocular muscles intact.  HEENT: Head atraumatic, normocephalic. Oropharynx and nasopharynx clear.  NECK:  Supple, no jugular venous distention. No thyroid enlargement, no tenderness.  LUNGS: Normal breath sounds bilaterally, no wheezing, rales,rhonchi or crepitation. No use of accessory muscles of respiration. Decreased bibasilar breath sounds CARDIOVASCULAR: S1, S2 normal. No rubs, or gallops. 2/6 systolic murmur present ABDOMEN: Soft, nontender, nondistended. Bowel sounds present. No  organomegaly or mass.  EXTREMITIES: both lower extremities are contracted, right leg is pale and has 2+ edema.  No cyanosis, or clubbing.  NEUROLOGIC: Cranial nerves II through XII are intact. Muscle strength 4/5 in both upper extremities and 2/5 of lower extremities, . Sensation intact. Gait not checked. Left hand tremors noted. PSYCHIATRIC: The patient is alert and oriented x 3.  SKIN: No obvious rash, lesion, or ulcer.    LABORATORY PANEL:   CBC  Recent Labs Lab 02/22/17 0420  WBC 6.5  HGB 8.0*  HCT 23.6*  PLT 551*   ------------------------------------------------------------------------------------------------------------------  Chemistries   Recent Labs Lab 02/21/17 1459 02/22/17 0420  NA 132* 134*  K 4.3 3.6  CL 101 102  CO2 25 26  GLUCOSE 103* 82  BUN 25* 21*  CREATININE 0.57* 0.59*  CALCIUM 8.7* 8.1*  AST 27  --   ALT 21  --   ALKPHOS 66  --   BILITOT 0.4  --    ------------------------------------------------------------------------------------------------------------------  Cardiac Enzymes No results for input(s): TROPONINI in the last 168 hours. ------------------------------------------------------------------------------------------------------------------  RADIOLOGY:  Ct Abdomen Pelvis Wo Contrast  Result Date: 02/21/2017 CLINICAL DATA:  83 y/o  M; weakness, weight loss, and hypotension. EXAM: CT CHEST, ABDOMEN AND PELVIS WITHOUT CONTRAST TECHNIQUE: Multidetector CT imaging of the chest, abdomen and pelvis was performed following the standard protocol without IV contrast. COMPARISON:  12/24/2014 CT of the abdomen and pelvis. FINDINGS: CT CHEST FINDINGS Cardiovascular: Normal caliber thoracic aorta and main pulmonary artery. Mild calcific atherosclerosis of the aortic arch. Normal heart size. No pericardial effusion. Moderate coronary artery calcifications. Mediastinum/Nodes: No enlarged mediastinal, hilar, or axillary lymph nodes. Thyroid and trachea  are unremarkable. Patulous esophagus with contrast opacification. Lungs/Pleura: Clusters of nodules throughout the right  lung and bronchovascular distribution compatible with acute bronchiolitis. Mild peribronchial thickening and scattered mucous plugging. No consolidation, effusion, or pneumothorax. Musculoskeletal: Compression deformity of the L1 vertebral body with severe anterior loss of height progressed from prior CT of abdomen and pelvis. Multilevel degenerative changes of the spine. Moderate bilateral hip osteoarthrosis. CT ABDOMEN PELVIS FINDINGS Hepatobiliary: No focal liver abnormality is seen. No gallstones, gallbladder wall thickening, or biliary dilatation. Pancreas: Unremarkable. No pancreatic ductal dilatation or surrounding inflammatory changes. Spleen: Normal in size without focal abnormality. Adrenals/Urinary Tract: Moderate left hydronephrosis secondary to a obstructing 5 mm stone in the distal ureter just proximal to the ureterovesicular junction (series 2, image 94). Bilateral nonobstructing kidney stones within the calices measuring up to 4 mm in the right kidney. Normal adrenal glands. Normal bladder. Stomach/Bowel: Stomach is within normal limits. Appendix not identified. No evidence of bowel wall thickening, distention, or inflammatory changes. Large volume of stool in the colon. Vascular/Lymphatic: No significant vascular findings are present. No enlarged abdominal or pelvic lymph nodes. Reproductive: Prostate is unremarkable. Other: No abdominal wall hernia or abnormality. No abdominopelvic ascites. Musculoskeletal: Compression deformity of the L1 vertebral body with severe anterior loss of height progressed from prior CT of abdomen and pelvis. Multilevel degenerative changes of the spine. Moderate bilateral hip osteoarthrosis. IMPRESSION: 1. Moderate left hydronephrosis secondary to an obstructing 5 mm stone in the distal left ureter just proximal to the ureterovesicular junction. 2.  Bilateral nonobstructing kidney stones. 3. Clusters of nodules in bronchovascular distribution in the right lung greater than left lung compatible with acute bronchiolitis. 4. L1 compression deformity with severe anterior loss of height progressed from prior study. 5. Large volume of stool in the colon, probable constipation. 6. Additional chronic findings as above. Electronically Signed   By: Kristine Garbe M.D.   On: 02/21/2017 20:52   Ct Chest Wo Contrast  Result Date: 02/21/2017 CLINICAL DATA:  83 y/o  M; weakness, weight loss, and hypotension. EXAM: CT CHEST, ABDOMEN AND PELVIS WITHOUT CONTRAST TECHNIQUE: Multidetector CT imaging of the chest, abdomen and pelvis was performed following the standard protocol without IV contrast. COMPARISON:  12/24/2014 CT of the abdomen and pelvis. FINDINGS: CT CHEST FINDINGS Cardiovascular: Normal caliber thoracic aorta and main pulmonary artery. Mild calcific atherosclerosis of the aortic arch. Normal heart size. No pericardial effusion. Moderate coronary artery calcifications. Mediastinum/Nodes: No enlarged mediastinal, hilar, or axillary lymph nodes. Thyroid and trachea are unremarkable. Patulous esophagus with contrast opacification. Lungs/Pleura: Clusters of nodules throughout the right lung and bronchovascular distribution compatible with acute bronchiolitis. Mild peribronchial thickening and scattered mucous plugging. No consolidation, effusion, or pneumothorax. Musculoskeletal: Compression deformity of the L1 vertebral body with severe anterior loss of height progressed from prior CT of abdomen and pelvis. Multilevel degenerative changes of the spine. Moderate bilateral hip osteoarthrosis. CT ABDOMEN PELVIS FINDINGS Hepatobiliary: No focal liver abnormality is seen. No gallstones, gallbladder wall thickening, or biliary dilatation. Pancreas: Unremarkable. No pancreatic ductal dilatation or surrounding inflammatory changes. Spleen: Normal in size without  focal abnormality. Adrenals/Urinary Tract: Moderate left hydronephrosis secondary to a obstructing 5 mm stone in the distal ureter just proximal to the ureterovesicular junction (series 2, image 94). Bilateral nonobstructing kidney stones within the calices measuring up to 4 mm in the right kidney. Normal adrenal glands. Normal bladder. Stomach/Bowel: Stomach is within normal limits. Appendix not identified. No evidence of bowel wall thickening, distention, or inflammatory changes. Large volume of stool in the colon. Vascular/Lymphatic: No significant vascular findings are present. No enlarged abdominal or  pelvic lymph nodes. Reproductive: Prostate is unremarkable. Other: No abdominal wall hernia or abnormality. No abdominopelvic ascites. Musculoskeletal: Compression deformity of the L1 vertebral body with severe anterior loss of height progressed from prior CT of abdomen and pelvis. Multilevel degenerative changes of the spine. Moderate bilateral hip osteoarthrosis. IMPRESSION: 1. Moderate left hydronephrosis secondary to an obstructing 5 mm stone in the distal left ureter just proximal to the ureterovesicular junction. 2. Bilateral nonobstructing kidney stones. 3. Clusters of nodules in bronchovascular distribution in the right lung greater than left lung compatible with acute bronchiolitis. 4. L1 compression deformity with severe anterior loss of height progressed from prior study. 5. Large volume of stool in the colon, probable constipation. 6. Additional chronic findings as above. Electronically Signed   By: Kristine Garbe M.D.   On: 02/21/2017 20:52    EKG:   Orders placed or performed during the hospital encounter of 12/03/16  . EKG 12-Lead  . EKG 12-Lead  . EKG 12-Lead  . EKG 12-Lead  . EKG  . EKG    ASSESSMENT AND PLAN:   Armany Mano  is a 83 y.o. male with a known history of asthma/COPD not on home o2, BPH, CAD, chronic hypotension, hypothyroidism from [facility] rehab  sent in from PCP office due to weakness, weight loss and hypotension.  #1 Acute cystitis- urine cultures sent for. - started on IV rocephin - CT abd with left ureteral stone and left hydronephrosis- urology consulted. - generalized deconditioning. Check wound cultures from left heel unstageable pressure ulcer as well - Dietitian consult - Physical therapy- used to ambulate with a walker before- but not ambulating and bed bound for almost 2 months now - continue megace.  #2 Hypotension- improving with fluids, could have been from infection. - IV fluids, orthostatics - continue midodrine and florinef  #3 Anemia- anemia of chronic disease. Baseline around 9.5 to 10 - no active bleeding. Patient recollects that his last colonoscopy was normal was almost 5 years ago - check anemia labs. Hb dropped to 8- dilutional. - no indication for transfusion as long as hb >7 - monitor  #4 Hypothyroidism- on synthroid, f/uTSH  #5 COPD/ asthma- stable, continue inhalers  #6 DVT Prophylaxis- lovenox- hold with anemia   Patient is from [facility] If not improving, consider palliative care     All the records are reviewed and case discussed with Care Management/Social Workerr. Management plans discussed with the patient, family and they are in agreement.  CODE STATUS: Full Code  TOTAL TIME TAKING CARE OF THIS PATIENT: 38 minutes.   POSSIBLE D/C IN 2-3 DAYS, DEPENDING ON CLINICAL CONDITION.   Gladstone Lighter M.D on 02/22/2017 at 8:54 AM  Between 7am to 6pm - Pager - [phone]  After 6pm go to [email or web address] - password [email or web address] [facility]  Office  [phone]  CC: Primary care physician; Tracie Harrier, MD

## 2017-02-22 NOTE — Progress Notes (Signed)
Pts VSS and oriented X4. Pt drowsy and grumpy at times when needing to be turned or cleaned. Pt remains on RA, PIV in place and receiving IV fluids. Pt reporting pain in legs and reporting adequate relief with PO Oxycodone. Pt taking meds whole with applesause with no signs of problems. Pt remains on dysphagia diet but tolerating thin liquids. Pt assessed at bedside and was unable to do much due to pts extremities contracted and pt experiencing pain with much movement. Pt with multiple wounds on legs, sacrum and heels. Wounds dressed and foam dressings in place. Wound consoult is pending. Plan to d/c back to [facility] upon d/c. Will continue to monitor.

## 2017-02-22 NOTE — Evaluation (Signed)
Physical Therapy Evaluation Patient Details Name: Kurt Hill MRN: [id number] DOB: 20-Dec-1931 Today's Date: 02/22/2017   History of Present Illness  Pt is a 83 y.o. male with ORIF surgeries of left patella including 07/30/16, 08/15/16, and 10/14/16.  New ileus, L kidney stone, UTI, L1 compression fracture, bronchiolitis.  SNF resident.  Referred to PT for eval of his mobility.  PMHx:  hip OA, L TKA, cellulits, malnutrition  Clinical Impression  Pt has an IR'd L hip that places his L knee and leg over his body, with a flexion contracture and dislocated L knee in appearance.  He is unable to straighten L knee, and is likely to need a positioning brace to aerate the skin behind L knee and work toward better positioning to protect LLE skin from pressure.  Have referred to hospitalist to consider an ortho consult for determining the need of LLE for bracing.  Will defer inpt acute therapy for now as PT does not have any real needs to address and pt is going back to his SNF for follow up care.  Discharge PT for now.    Follow Up Recommendations Other (comment);SNF (Pt can be seen in SNF for screening)    Equipment Recommendations  Other (comment)    Recommendations for Other Services Other (comment) (orthopedic consult for L Knee bracing)     Precautions / Restrictions Precautions Precautions: Fall (telemetry) Restrictions Weight Bearing Restrictions: No      Mobility  Bed Mobility Overal bed mobility: Needs Assistance Bed Mobility: Rolling;Supine to Sit Rolling: Max assist;+2 for physical assistance;+2 for safety/equipment   Supine to sit: Total assist (unable to tolerate due to L LE contractures)        Transfers                 General transfer comment: dependent  Ambulation/Gait             General Gait Details: non ambulatory  Stairs            Wheelchair Mobility    Modified Rankin (Stroke Patients Only)       Balance                                             Pertinent Vitals/Pain Pain Assessment: Faces Faces Pain Scale: Hurts even more Pain Location: L knee with stretches on LLE Pain Intervention(s): Limited activity within patient's tolerance;Monitored during session;Repositioned    Home Living Family/patient expects to be discharged to:: Skilled nursing facility                      Prior Function Level of Independence: Needs assistance   Gait / Transfers Assistance Needed: non ambulatory for last several months  ADL's / Homemaking Assistance Needed: SNF resident with help for all care        Hand Dominance   Dominant Hand: Right    Extremity/Trunk Assessment   Upper Extremity Assessment Upper Extremity Assessment: Generalized weakness    Lower Extremity Assessment Lower Extremity Assessment: LLE deficits/detail LLE Deficits / Details: flexed knee on LLE with -80 knee ext, L hip in IR LLE: Unable to fully assess due to immobilization;Unable to fully assess due to pain LLE Coordination: decreased fine motor;decreased gross motor    Cervical / Trunk Assessment Cervical / Trunk Assessment: Kyphotic  Communication   Communication: No difficulties  Cognition Arousal/Alertness: Awake/alert Behavior During Therapy: Anxious Overall Cognitive Status: Impaired/Different from baseline Area of Impairment: Awareness;Problem solving;Following commands       Following Commands: Follows one step commands with increased time   Awareness: Intellectual Problem Solving: Slow processing;Decreased initiation;Requires verbal cues;Requires tactile cues      General Comments      Exercises     Assessment/Plan    PT Assessment All further PT needs can be met in the next venue of care  PT Problem List Pain;Decreased strength;Decreased range of motion;Decreased activity tolerance;Decreased mobility;Decreased knowledge of precautions       PT Treatment Interventions      PT Goals  (Current goals can be found in the Care Plan section)  Acute Rehab PT Goals Patient Stated Goal: none stated    Frequency     Barriers to discharge        Co-evaluation               End of Session   Activity Tolerance: Patient limited by pain;Other (comment) (risk of pt injury to move LLE) Patient left: in bed;with call bell/phone within reach;with bed alarm set;with family/visitor present;with nursing/sitter in room Nurse Communication: Mobility status;Other (comment) (need for ortho consult) PT Visit Diagnosis: Pain;Other abnormalities of gait and mobility (R26.89) Pain - Right/Left: Left Pain - part of body: Knee    Functional Assessment Tool Used: AM-PAC 6 Clicks Basic Mobility    Time: 5997-7414 PT Time Calculation (min) (ACUTE ONLY): 32 min   Charges:   PT Evaluation $PT Eval Moderate Complexity: 1 Procedure PT Treatments $Therapeutic Activity: 8-22 mins   PT G Codes:   PT G-Codes **NOT FOR INPATIENT CLASS** Functional Assessment Tool Used: AM-PAC 6 Clicks Basic Mobility     Ramond Dial 02/22/2017, 5:51 PM Mee Hives, PT MS Acute Rehab Dept. Number: [facility]

## 2017-02-23 ENCOUNTER — Inpatient Hospital Stay: Payer: Medicare Other

## 2017-02-23 DIAGNOSIS — M549 Dorsalgia, unspecified: Secondary | ICD-10-CM

## 2017-02-23 DIAGNOSIS — I2510 Atherosclerotic heart disease of native coronary artery without angina pectoris: Secondary | ICD-10-CM

## 2017-02-23 DIAGNOSIS — Z9889 Other specified postprocedural states: Secondary | ICD-10-CM

## 2017-02-23 DIAGNOSIS — G8929 Other chronic pain: Secondary | ICD-10-CM

## 2017-02-23 DIAGNOSIS — N132 Hydronephrosis with renal and ureteral calculous obstruction: Secondary | ICD-10-CM

## 2017-02-23 DIAGNOSIS — Z7982 Long term (current) use of aspirin: Secondary | ICD-10-CM

## 2017-02-23 DIAGNOSIS — J449 Chronic obstructive pulmonary disease, unspecified: Secondary | ICD-10-CM

## 2017-02-23 DIAGNOSIS — D649 Anemia, unspecified: Secondary | ICD-10-CM

## 2017-02-23 DIAGNOSIS — N400 Enlarged prostate without lower urinary tract symptoms: Secondary | ICD-10-CM

## 2017-02-23 DIAGNOSIS — Z79899 Other long term (current) drug therapy: Secondary | ICD-10-CM

## 2017-02-23 DIAGNOSIS — M25569 Pain in unspecified knee: Secondary | ICD-10-CM

## 2017-02-23 DIAGNOSIS — E039 Hypothyroidism, unspecified: Secondary | ICD-10-CM

## 2017-02-23 DIAGNOSIS — Z85828 Personal history of other malignant neoplasm of skin: Secondary | ICD-10-CM

## 2017-02-23 DIAGNOSIS — R5381 Other malaise: Secondary | ICD-10-CM

## 2017-02-23 LAB — C-REACTIVE PROTEIN: CRP: 12.5 mg/dL — ABNORMAL HIGH (ref <1.0)

## 2017-02-23 LAB — LACTATE DEHYDROGENASE: LDH: 154 U/L (ref 98–192)

## 2017-02-23 LAB — URINE CULTURE: CULTURE: NO GROWTH

## 2017-02-23 LAB — T4, FREE: FREE T4: 0.9 ng/dL (ref 0.61–1.12)

## 2017-02-23 MED ORDER — ENSURE ENLIVE PO LIQD
237.0000 mL | Freq: Three times a day (TID) | ORAL | Status: DC
  Administered 2017-02-23 – 2017-02-25 (×4): 237 mL via ORAL

## 2017-02-23 MED ORDER — CHLORHEXIDINE GLUCONATE CLOTH 2 % EX PADS
6.0000 | MEDICATED_PAD | Freq: Every day | CUTANEOUS | Status: DC
  Administered 2017-02-24 – 2017-02-26 (×2): 6 via TOPICAL

## 2017-02-23 MED ORDER — BISACODYL 5 MG PO TBEC
10.0000 mg | DELAYED_RELEASE_TABLET | Freq: Every day | ORAL | Status: AC
  Administered 2017-02-23 – 2017-02-25 (×3): 10 mg via ORAL
  Filled 2017-02-23 (×3): qty 2

## 2017-02-23 MED ORDER — MUPIROCIN 2 % EX OINT
1.0000 "application " | TOPICAL_OINTMENT | Freq: Two times a day (BID) | CUTANEOUS | Status: DC
  Administered 2017-02-23 – 2017-02-25 (×5): 1 via NASAL
  Filled 2017-02-23: qty 22

## 2017-02-23 MED ORDER — FLEET ENEMA 7-19 GM/118ML RE ENEM: 1.0000 | ENEMA | Freq: Once | RECTAL | Status: DC | PRN

## 2017-02-23 NOTE — Progress Notes (Signed)
Initial Nutrition Assessment  DOCUMENTATION CODES:   Severe malnutrition in context of chronic illness  INTERVENTION:  1. Ensure Enlive po TID, each supplement provides 350 kcal and 20 grams of protein 2. Continue Magic cup TID with meals, each supplement provides 290 kcal and 9 grams of protein   NUTRITION DIAGNOSIS:   Malnutrition related to chronic illness as evidenced by severe depletion of muscle mass, severe depletion of body fat.  GOAL:   Patient will meet greater than or equal to 90% of their needs  MONITOR:   PO intake, I & O's, Supplement acceptance, Labs, Weight trends  REASON FOR ASSESSMENT:   Consult Poor PO  ASSESSMENT:   Kurt Hill  is a 83 y.o. male with a known history of asthma/COPD not on home o2, BPH, CAD, chronic hypotension, hypothyroidism from [facility] rehab sent in from PCP office due to weakness, weight loss and hypotension  Spoke with Kurt Hill at bedside. He reports eating well PTA with good appetite but continues to demonstrate severe wt loss. States he was consuming 3 meals a day at his facility, but per H&P he had poor PO intake and was unable to get out of bed for the last 2 months. Consumes pureed diet at facility - was continued here no complaints - patient had scrambled eggs, milk, orange juice, orange cream sickle magic cup, and grits this morning for breakfast. He is unsure of what has caused his progressive weight loss. States his weight is "down" with an normal weight of 160# Per chart he exhibits an 18#/12.5% severe wt loss over 3 months. Denies any nausea/vomiting or other acute complaints. States he consumes Ensure at facility, will provide here  Labs and medications reviewed: Na 134 Reglan, MIralax/Glycolax, Senokot-S, Megace  Nutrition-Focused physical exam completed. Findings are severe fat depletion, severe muscle depletion, and no edema.     Diet Order:  DIET - DYS 1 Room service appropriate? Yes; Fluid  consistency: Thin  Skin:  Wound (see comment) (Stg I to Coccyx, Unstagable to coccyx)  Last BM:  02/22/2017  Height:   Ht Readings from Last 1 Encounters:  02/21/17 5\' 10"  (1.778 m)    Weight:   Wt Readings from Last 1 Encounters:  02/21/17 125 lb 4.8 oz (56.8 kg)    Ideal Body Weight:  75.45 kg  BMI:  Body mass index is 17.98 kg/m.  Estimated Nutritional Needs:   Kcal:  1300-1600 calories  Protein:  57-68 gm  Fluid:  >/= 1.3L  EDUCATION NEEDS:   No education needs identified at this time  Satira Anis. Trevone Prestwood, MS, RD LDN Inpatient Clinical Dietitian Pager [phone]

## 2017-02-23 NOTE — Progress Notes (Signed)
[place] CONSULT NOTE  Patient Care Team: Tracie Harrier, MD as PCP - General (Internal Medicine)  CHIEF COMPLAINTS/PURPOSE OF CONSULTATION: Recurrent/persistent anemia  HISTORY OF PRESENTING ILLNESS:  Kurt Hill 83 y.o.  male history of recent knee surgery left patella ORIF-with failed orthopedic implant-currently being treated conservatively. Patient had been at rehabilitation; more recently noted to have worsening generalized weakness/ also noted to have low blood pressures in the PCPs office. He has been admitted to the hospital for further workup. Also possible weight loss.  Workup included- a CT scan of the chest and pelvis that showed- no obvious concerns of any malignancy. Left distal ureter stone with mild hydronephrosis. Urology following. Patient also noted to have hemoglobin 8.0 MCV 92 platelets 551 and normal white count. We have been consult for further evaluation and recommendations.  Patient has chronic back pain. Chronic pain in his knee. Overall he feels poorly. Denies any fevers or chills. He has restricted mobility because of his knee surgery. Patient denies any blood in stools or black stools. Denies any blood in urine.  ROS: A complete 10 point review of system is done which is negative except mentioned above in history of present illness  MEDICAL HISTORY:  Past Medical History:  Diagnosis Date  . Anemia   . Asthma   . BPH (benign prostatic hyperplasia)   . COPD (chronic obstructive pulmonary disease) (HCC)    not on home oxygen  . Coronary artery disease   . Hypotension   . Hypothyroidism   . Skin abnormalities   . Skin cancer     SURGICAL HISTORY: Past Surgical History:  Procedure Laterality Date  . APPENDECTOMY    . BONE RESECTION     on nose  . MOHS SURGERY    . ORIF PATELLA Left 07/30/2016   Procedure: OPEN REDUCTION INTERNAL (ORIF) FIXATION PATELLA;  Surgeon: Hessie Knows, MD;  Location: [facility] ORS;  Service: Orthopedics;   Laterality: Left;  . ORIF PATELLA Left 08/15/2016   Procedure: OPEN REDUCTION INTERNAL (ORIF) FIXATION PATELLA;  Surgeon: Hessie Knows, MD;  Location: [facility] OR[facility];  Service: Orthopedics;  Laterality: Left;  . ORIF PATELLA Left 10/14/2016   Procedure: OPEN REDUCTION INTERNAL (ORIF) FIXATION PATELLA;  Surgeon: Hessie Knows, MD;  Location: [facility] OR[facility];  Service: Orthopedics;  Laterality: Left;  . TONSILLECTOMY      SOCIAL HISTORY: Social History   Social History  . Marital status: Widowed    Spouse name: N/A  . Number of children: N/A  . Years of education: N/A   Occupational History  . Not on file.   Social History Main Topics  . Smoking status: Never Smoker  . Smokeless tobacco: Never Used     Comment: tried cigarettes when young- but never really smoked  . Alcohol use No  . Drug use: No  . Sexual activity: No   Other Topics Concern  . Not on file   Social History Narrative   But has been at [facility] now for a few months.    FAMILY HISTORY: Family History  Problem Relation Age of Onset  . CAD Other   . Diabetes Mother     ALLERGIES:  is allergic to ivp dye [iodinated diagnostic agents] and sulfa antibiotics.  MEDICATIONS:  Current Facility-Administered Medications  Medication Dose Route Frequency Provider Last Rate Last Dose  . acetaminophen (TYLENOL) tablet 650 mg  650 mg Oral Q6H PRN Gladstone Lighter, MD       Or  .  acetaminophen (TYLENOL) suppository 650 mg  650 mg Rectal Q6H PRN Gladstone Lighter, MD      . albuterol (PROVENTIL) (2.5 MG/3ML) 0.083% nebulizer solution 2.5 mg  2.5 mg Nebulization Q6H PRN Gladstone Lighter, MD      . alum & mag hydroxide-simeth (MAALOX/MYLANTA) 200-200-20 MG/5ML suspension 30 mL  30 mL Oral Q6H PRN Gladstone Lighter, MD      . aspirin EC tablet 81 mg  81 mg Oral q morning - 10a Gladstone Lighter, MD   81 mg at 02/23/17 1033  . bisacodyl (DULCOLAX) EC tablet 10 mg  10 mg Oral Daily Gladstone Lighter, MD   10 mg at  02/23/17 1032  . cefTRIAXone (ROCEPHIN) 1 g in dextrose 5 % 50 mL IVPB  1 g Intravenous Q24H Gladstone Lighter, MD   1 g at 02/23/17 1036  . collagenase (SANTYL) ointment   Topical Daily Gladstone Lighter, MD      . finasteride (PROSCAR) tablet 5 mg  5 mg Oral q morning - 10a Gladstone Lighter, MD   5 mg at 02/23/17 1032  . fludrocortisone (FLORINEF) tablet 0.1 mg  0.1 mg Oral Daily Gladstone Lighter, MD   0.1 mg at 02/23/17 1032  . levothyroxine (SYNTHROID, LEVOTHROID) tablet 100 mcg  100 mcg Oral Daily Gladstone Lighter, MD   100 mcg at 02/23/17 0527  . megestrol (MEGACE) tablet 40 mg  40 mg Oral Daily Gladstone Lighter, MD   40 mg at 02/23/17 1032  . metoCLOPramide (REGLAN) tablet 5 mg  5 mg Oral TID AC & HS Gladstone Lighter, MD   5 mg at 02/23/17 1033  . midodrine (PROAMATINE) tablet 10 mg  10 mg Oral TID WC Gladstone Lighter, MD   10 mg at 02/23/17 1032  . mometasone-formoterol (DULERA) 100-5 MCG/ACT inhaler 2 puff  2 puff Inhalation BID Gladstone Lighter, MD   2 puff at 02/23/17 1033  . ondansetron (ZOFRAN) tablet 4 mg  4 mg Oral Q6H PRN Gladstone Lighter, MD       Or  . ondansetron (ZOFRAN) injection 4 mg  4 mg Intravenous Q6H PRN Gladstone Lighter, MD      . oxyCODONE (Oxy IR/ROXICODONE) immediate release tablet 5 mg  5 mg Oral Q4H PRN Gladstone Lighter, MD   5 mg at 02/22/17 1731  . phenol-menthol (CEPASTAT) lozenge 1 lozenge  1 lozenge Buccal PRN Gladstone Lighter, MD      . polyethylene glycol (MIRALAX / GLYCOLAX) packet 17 g  17 g Oral Daily Gladstone Lighter, MD   17 g at 02/23/17 1032  . senna-docusate (Senokot-S) tablet 2 tablet  2 tablet Oral BID Gladstone Lighter, MD   2 tablet at 02/23/17 1032  . sertraline (ZOLOFT) tablet 25 mg  25 mg Oral Daily Gladstone Lighter, MD   25 mg at 02/23/17 1032  . sodium phosphate (FLEET) 7-19 GM/118ML enema 1 enema  1 enema Rectal Once PRN Gladstone Lighter, MD      . tamsulosin (FLOMAX) capsule 0.4 mg  0.4 mg Oral Daily Gladstone Lighter, MD    0.4 mg at 02/23/17 1032  . traMADol (ULTRAM) tablet 50 mg  50 mg Oral Q6H PRN Gladstone Lighter, MD          .  PHYSICAL EXAMINATION:  Vitals:   02/22/17 1940 02/23/17 0444  BP: (!) 97/57 99/60  Pulse: 79 70  Resp: 16 14  Temp: 98.2 F (36.8 C) 98.6 F (37 C)   Filed Weights   02/21/17 1400 02/21/17 1955  Weight: 147  lb 0.8 oz (66.7 kg) 125 lb 4.8 oz (56.8 kg)    GENERAL: Appears cachectic. Alert, no distress and comfortable.   He is alone. EYES: no  Icterus; positive for pallor. OROPHARYNX: no thrush or ulceration. NECK: supple, no masses felt LYMPH:  no palpable lymphadenopathy in the cervical, axillary or inguinal regions LUNGS: decreased breath sounds to auscultation at bases and  No wheeze or crackles HEART/CVS: regular rate & rhythm and no murmurs; No lower extremity edema ABDOMEN: abdomen soft, non-tender and normal bowel sounds Musculoskeletal:no cyanosis of digits and no clubbing; flexion deformity noted in the knee. PSYCH: alert & oriented x 3 with fluent speech NEURO: no focal motor/sensory deficits SKIN:  no rashes or significant lesions; decubitus ulcers noted bilateral heels.  LABORATORY DATA:  I have reviewed the data as listed Lab Results  Component Value Date   WBC 6.5 02/22/2017   HGB 8.0 (L) 02/22/2017   HCT 23.6 (L) 02/22/2017   MCV 92.1 02/22/2017   PLT 551 (H) 02/22/2017    Recent Labs  11/28/16 1754  12/04/16 0414 12/07/16 0519 02/21/17 1459 02/22/17 0420  NA 134*  < > 138  --  132* 134*  K 4.5  < > 4.1  --  4.3 3.6  CL 98*  < > 101  --  101 102  CO2 31  < > 30  --  25 26  GLUCOSE 95  < > 95  --  103* 82  BUN 26*  < > 26*  --  25* 21*  CREATININE 1.00  < > 1.04 0.99 0.57* 0.59*  CALCIUM 8.9  < > 10.7*  --  8.7* 8.1*  GFRNONAA >60  < > >60 >60 >60 >60  GFRAA >60  < > >60 >60 >60 >60  PROT 6.1*  --   --   --  7.0  --   ALBUMIN 2.4*  --   --   --  2.6*  --   AST 26  --   --   --  27  --   ALT 16*  --   --   --  21  --   ALKPHOS  86  --   --   --  66  --   BILITOT 0.2*  --   --   --  0.4  --   < > = values in this interval not displayed.  RADIOGRAPHIC STUDIES: I have personally reviewed the radiological images as listed and agreed with the findings in the report. Ct Abdomen Pelvis Wo Contrast  Result Date: 02/21/2017 CLINICAL DATA:  83 y/o  M; weakness, weight loss, and hypotension. EXAM: CT CHEST, ABDOMEN AND PELVIS WITHOUT CONTRAST TECHNIQUE: Multidetector CT imaging of the chest, abdomen and pelvis was performed following the standard protocol without IV contrast. COMPARISON:  12/24/2014 CT of the abdomen and pelvis. FINDINGS: CT CHEST FINDINGS Cardiovascular: Normal caliber thoracic aorta and main pulmonary artery. Mild calcific atherosclerosis of the aortic arch. Normal heart size. No pericardial effusion. Moderate coronary artery calcifications. Mediastinum/Nodes: No enlarged mediastinal, hilar, or axillary lymph nodes. Thyroid and trachea are unremarkable. Patulous esophagus with contrast opacification. Lungs/Pleura: Clusters of nodules throughout the right lung and bronchovascular distribution compatible with acute bronchiolitis. Mild peribronchial thickening and scattered mucous plugging. No consolidation, effusion, or pneumothorax. Musculoskeletal: Compression deformity of the L1 vertebral body with severe anterior loss of height progressed from prior CT of abdomen and pelvis. Multilevel degenerative changes of the spine. Moderate bilateral hip osteoarthrosis. CT ABDOMEN PELVIS  FINDINGS Hepatobiliary: No focal liver abnormality is seen. No gallstones, gallbladder wall thickening, or biliary dilatation. Pancreas: Unremarkable. No pancreatic ductal dilatation or surrounding inflammatory changes. Spleen: Normal in size without focal abnormality. Adrenals/Urinary Tract: Moderate left hydronephrosis secondary to a obstructing 5 mm stone in the distal ureter just proximal to the ureterovesicular junction (series 2, image 94).  Bilateral nonobstructing kidney stones within the calices measuring up to 4 mm in the right kidney. Normal adrenal glands. Normal bladder. Stomach/Bowel: Stomach is within normal limits. Appendix not identified. No evidence of bowel wall thickening, distention, or inflammatory changes. Large volume of stool in the colon. Vascular/Lymphatic: No significant vascular findings are present. No enlarged abdominal or pelvic lymph nodes. Reproductive: Prostate is unremarkable. Other: No abdominal wall hernia or abnormality. No abdominopelvic ascites. Musculoskeletal: Compression deformity of the L1 vertebral body with severe anterior loss of height progressed from prior CT of abdomen and pelvis. Multilevel degenerative changes of the spine. Moderate bilateral hip osteoarthrosis. IMPRESSION: 1. Moderate left hydronephrosis secondary to an obstructing 5 mm stone in the distal left ureter just proximal to the ureterovesicular junction. 2. Bilateral nonobstructing kidney stones. 3. Clusters of nodules in bronchovascular distribution in the right lung greater than left lung compatible with acute bronchiolitis. 4. L1 compression deformity with severe anterior loss of height progressed from prior study. 5. Large volume of stool in the colon, probable constipation. 6. Additional chronic findings as above. Electronically Signed   By: Kristine Garbe M.D.   On: 02/21/2017 20:52   Dg Abd 1 View  Result Date: 02/23/2017 CLINICAL DATA:  New ileus. EXAM: ABDOMEN - 1 VIEW COMPARISON:  02/22/2017 and CT 02/21/2017 FINDINGS: Examination demonstrates a a nonobstructive bowel gas pattern with moderate fecal retention throughout the colon and mild residual contrast within the colon from recent CT. Several air-filled nondilated small bowel loops are present. No free peritoneal air. No mass or mass effect. Stable L1 compression fracture. Remainder of the exam is unchanged. IMPRESSION: Nonobstructive bowel gas pattern with moderate  fecal retention throughout the colon. Electronically Signed   By: Marin Olp M.D.   On: 02/23/2017 08:55   Dg Abd 1 View  Result Date: 02/22/2017 CLINICAL DATA:  Distal left ureteral calculus and left hydronephrosis on the recent CT. EXAM: ABDOMEN - 1 VIEW COMPARISON:  Abdomen and pelvis CT dated 02/21/2017. FINDINGS: Gas distended colon with prominent stool. The distal colon is not distended and demonstrates some stool. Stable compression deformity of the L1 vertebral body. A left lateral pelvic vascular calcification or phlebolith is unchanged. The previously seen distal left ureteral calculus is not visible. IMPRESSION: 1. The previously seen distal left ureteral calculus is not visible on this examination. 2. Colonic ileus with prominent stool. Electronically Signed   By: Claudie Revering M.D.   On: 02/22/2017 12:02   Ct Chest Wo Contrast  Result Date: 02/21/2017 CLINICAL DATA:  83 y/o  M; weakness, weight loss, and hypotension. EXAM: CT CHEST, ABDOMEN AND PELVIS WITHOUT CONTRAST TECHNIQUE: Multidetector CT imaging of the chest, abdomen and pelvis was performed following the standard protocol without IV contrast. COMPARISON:  12/24/2014 CT of the abdomen and pelvis. FINDINGS: CT CHEST FINDINGS Cardiovascular: Normal caliber thoracic aorta and main pulmonary artery. Mild calcific atherosclerosis of the aortic arch. Normal heart size. No pericardial effusion. Moderate coronary artery calcifications. Mediastinum/Nodes: No enlarged mediastinal, hilar, or axillary lymph nodes. Thyroid and trachea are unremarkable. Patulous esophagus with contrast opacification. Lungs/Pleura: Clusters of nodules throughout the right lung and bronchovascular distribution compatible  with acute bronchiolitis. Mild peribronchial thickening and scattered mucous plugging. No consolidation, effusion, or pneumothorax. Musculoskeletal: Compression deformity of the L1 vertebral body with severe anterior loss of height progressed from  prior CT of abdomen and pelvis. Multilevel degenerative changes of the spine. Moderate bilateral hip osteoarthrosis. CT ABDOMEN PELVIS FINDINGS Hepatobiliary: No focal liver abnormality is seen. No gallstones, gallbladder wall thickening, or biliary dilatation. Pancreas: Unremarkable. No pancreatic ductal dilatation or surrounding inflammatory changes. Spleen: Normal in size without focal abnormality. Adrenals/Urinary Tract: Moderate left hydronephrosis secondary to a obstructing 5 mm stone in the distal ureter just proximal to the ureterovesicular junction (series 2, image 94). Bilateral nonobstructing kidney stones within the calices measuring up to 4 mm in the right kidney. Normal adrenal glands. Normal bladder. Stomach/Bowel: Stomach is within normal limits. Appendix not identified. No evidence of bowel wall thickening, distention, or inflammatory changes. Large volume of stool in the colon. Vascular/Lymphatic: No significant vascular findings are present. No enlarged abdominal or pelvic lymph nodes. Reproductive: Prostate is unremarkable. Other: No abdominal wall hernia or abnormality. No abdominopelvic ascites. Musculoskeletal: Compression deformity of the L1 vertebral body with severe anterior loss of height progressed from prior CT of abdomen and pelvis. Multilevel degenerative changes of the spine. Moderate bilateral hip osteoarthrosis. IMPRESSION: 1. Moderate left hydronephrosis secondary to an obstructing 5 mm stone in the distal left ureter just proximal to the ureterovesicular junction. 2. Bilateral nonobstructing kidney stones. 3. Clusters of nodules in bronchovascular distribution in the right lung greater than left lung compatible with acute bronchiolitis. 4. L1 compression deformity with severe anterior loss of height progressed from prior study. 5. Large volume of stool in the colon, probable constipation. 6. Additional chronic findings as above. Electronically Signed   By: Kristine Garbe  M.D.   On: 02/21/2017 20:52    ASSESSMENT & PLAN:   # 83 year old male patient with history of left knee patella surgery ORIF with failed implant- currently admitted to the hospital for generalized weakness/ relative hypotension noted to have severe anemia  # Severe anemia hemoglobin 8- normal MCV; no evidence of obvious iron deficiency based on iron studies [also iron saturations LOW at 12%' with normal ferritin  211]. I suspect patient has anemia of chronic disease/question underlying chronic infection. Patient's CRP has been elevated in the past. Check CRP and LDH; haptoglobin myeloma panel And lambda light chains to rule out any other cause. Clinically I do not suspect any primary bone marrow process at this time however if other causes ruled out bone marrow biopsy recommended; hold for now. If hemoglobin less than 8 recommend unit of PRBC transfusion.    #  Left knee surgery ORIF with failed implant / left ureter moderate hydronephrosis.   Thank you Dr. Tressia Miners for allowing me to participate in the care of your pleasant patient. Please do not hesitate to contact me with questions or concerns in the interim.  All questions were answered. The patient knows to call the clinic with any problems, questions or concerns.    Cammie Sickle, MD 02/23/2017 11:24 AM

## 2017-02-23 NOTE — Consult Note (Signed)
Urology Inpatient Progress Report  hypotension, emaciated, malnutrition, left heel ulcer  I am following this patient for a Left distal ureteral stone that is otherwise asymptomatic but the patient was noted to have hydronephrosis. He is being managed currently with medical  expulsion therapy.  Intv/Subj: No acute events overnight.  Patient is without complaint.  Active Problems:   Anemia  Current Facility-Administered Medications  Medication Dose Route Frequency Provider Last Rate Last Dose  . acetaminophen (TYLENOL) tablet 650 mg  650 mg Oral Q6H PRN Gladstone Lighter, MD       Or  . acetaminophen (TYLENOL) suppository 650 mg  650 mg Rectal Q6H PRN Gladstone Lighter, MD      . albuterol (PROVENTIL) (2.5 MG/3ML) 0.083% nebulizer solution 2.5 mg  2.5 mg Nebulization Q6H PRN Gladstone Lighter, MD      . alum & mag hydroxide-simeth (MAALOX/MYLANTA) 200-200-20 MG/5ML suspension 30 mL  30 mL Oral Q6H PRN Gladstone Lighter, MD      . aspirin EC tablet 81 mg  81 mg Oral q morning - 10a Gladstone Lighter, MD   81 mg at 02/22/17 1023  . bisacodyl (DULCOLAX) EC tablet 10 mg  10 mg Oral Daily Gladstone Lighter, MD   10 mg at 02/22/17 1024  . cefTRIAXone (ROCEPHIN) 1 g in dextrose 5 % 50 mL IVPB  1 g Intravenous Q24H Gladstone Lighter, MD   1 g at 02/22/17 0900  . collagenase (SANTYL) ointment   Topical Daily Gladstone Lighter, MD      . finasteride (PROSCAR) tablet 5 mg  5 mg Oral q morning - 10a Gladstone Lighter, MD   5 mg at 02/22/17 1023  . fludrocortisone (FLORINEF) tablet 0.1 mg  0.1 mg Oral Daily Gladstone Lighter, MD   0.1 mg at 02/22/17 1023  . levothyroxine (SYNTHROID, LEVOTHROID) tablet 100 mcg  100 mcg Oral Daily Gladstone Lighter, MD   100 mcg at 02/23/17 0527  . megestrol (MEGACE) tablet 40 mg  40 mg Oral Daily Gladstone Lighter, MD   40 mg at 02/22/17 1024  . metoCLOPramide (REGLAN) tablet 5 mg  5 mg Oral TID AC & HS Gladstone Lighter, MD   5 mg at 02/22/17 2130  . midodrine  (PROAMATINE) tablet 10 mg  10 mg Oral TID WC Gladstone Lighter, MD   10 mg at 02/22/17 1731  . mometasone-formoterol (DULERA) 100-5 MCG/ACT inhaler 2 puff  2 puff Inhalation BID Gladstone Lighter, MD   2 puff at 02/22/17 2132  . ondansetron (ZOFRAN) tablet 4 mg  4 mg Oral Q6H PRN Gladstone Lighter, MD       Or  . ondansetron (ZOFRAN) injection 4 mg  4 mg Intravenous Q6H PRN Gladstone Lighter, MD      . oxyCODONE (Oxy IR/ROXICODONE) immediate release tablet 5 mg  5 mg Oral Q4H PRN Gladstone Lighter, MD   5 mg at 02/22/17 1731  . phenol-menthol (CEPASTAT) lozenge 1 lozenge  1 lozenge Buccal PRN Gladstone Lighter, MD      . polyethylene glycol (MIRALAX / GLYCOLAX) packet 17 g  17 g Oral Daily Gladstone Lighter, MD   17 g at 02/22/17 1025  . senna-docusate (Senokot-S) tablet 2 tablet  2 tablet Oral BID Gladstone Lighter, MD   2 tablet at 02/22/17 2130  . sertraline (ZOLOFT) tablet 25 mg  25 mg Oral Daily Gladstone Lighter, MD   25 mg at 02/22/17 1023  . tamsulosin (FLOMAX) capsule 0.4 mg  0.4 mg Oral Daily Gladstone Lighter, MD   0.4  mg at 02/22/17 1023  . traMADol (ULTRAM) tablet 50 mg  50 mg Oral Q6H PRN Gladstone Lighter, MD         Objective: Vital: Vitals:   02/22/17 0500 02/22/17 1500 02/22/17 1940 02/23/17 0444  BP: 130/66 (!) 92/54 (!) 97/57 99/60  Pulse: 80 87 79 70  Resp: 18 18 16 14   Temp: 98.4 F (36.9 C) 98.1 F (36.7 C) 98.2 F (36.8 C) 98.6 F (37 C)  TempSrc: Axillary Oral Axillary Axillary  SpO2: 98% 99% 97% 96%  Weight:      Height:       I/Os: I/O last 3 completed shifts: In: 1056 [P.O.:1006; IV Piggyback:50] Out: -   Physical Exam:  General: Patient is in no apparent distress No abdominal of flank tenderness Contracted extremities  Lab Results:  Recent Labs  02/21/17 1459 02/22/17 0420  WBC 8.9 6.5  HGB 9.0* 8.0*  HCT 26.7* 23.6*    Recent Labs  02/21/17 1459 02/22/17 0420  NA 132* 134*  K 4.3 3.6  CL 101 102  CO2 25 26  GLUCOSE 103* 82   BUN 25* 21*  CREATININE 0.57* 0.59*  CALCIUM 8.7* 8.1*   No results for input(s): LABPT, INR in the last 72 hours. No results for input(s): LABURIN in the last 72 hours. Results for orders placed or performed during the hospital encounter of 02/21/17  MRSA PCR Screening     Status: Abnormal   Collection Time: 02/21/17  3:48 PM  Result Value Ref Range Status   MRSA by PCR POSITIVE (A) NEGATIVE Final    Comment:        The GeneXpert MRSA Assay (FDA approved for NASAL specimens only), is one component of a comprehensive MRSA colonization surveillance program. It is not intended to diagnose MRSA infection nor to guide or monitor treatment for MRSA infections. RESULT CALLED TO, READ BACK BY AND VERIFIED WITH: Yves Dill RN AT 7408 02/21/17 MSS.   Aerobic/Anaerobic Culture (surgical/deep wound)     Status: None (Preliminary result)   Collection Time: 02/21/17  7:50 PM  Result Value Ref Range Status   Specimen Description ULCER  Final   Special Requests NONE  Final   Gram Stain   Final    NO WBC SEEN FEW GRAM POSITIVE RODS RARE GRAM POSITIVE COCCI IN PAIRS FEW SQUAMOUS EPITHELIAL CELLS PRESENT Performed at [facility], [place] [address], [place], [place] [address]    Culture PENDING  Incomplete   Report Status PENDING  Incomplete    Studies/Results: Ct Abdomen Pelvis Wo Contrast  Result Date: 02/21/2017 CLINICAL DATA:  83 y/o  M; weakness, weight loss, and hypotension. EXAM: CT CHEST, ABDOMEN AND PELVIS WITHOUT CONTRAST TECHNIQUE: Multidetector CT imaging of the chest, abdomen and pelvis was performed following the standard protocol without IV contrast. COMPARISON:  12/24/2014 CT of the abdomen and pelvis. FINDINGS: CT CHEST FINDINGS Cardiovascular: Normal caliber thoracic aorta and main pulmonary artery. Mild calcific atherosclerosis of the aortic arch. Normal heart size. No pericardial effusion. Moderate coronary artery calcifications. Mediastinum/Nodes: No enlarged  mediastinal, hilar, or axillary lymph nodes. Thyroid and trachea are unremarkable. Patulous esophagus with contrast opacification. Lungs/Pleura: Clusters of nodules throughout the right lung and bronchovascular distribution compatible with acute bronchiolitis. Mild peribronchial thickening and scattered mucous plugging. No consolidation, effusion, or pneumothorax. Musculoskeletal: Compression deformity of the L1 vertebral body with severe anterior loss of height progressed from prior CT of abdomen and pelvis. Multilevel degenerative changes of the spine. Moderate bilateral  hip osteoarthrosis. CT ABDOMEN PELVIS FINDINGS Hepatobiliary: No focal liver abnormality is seen. No gallstones, gallbladder wall thickening, or biliary dilatation. Pancreas: Unremarkable. No pancreatic ductal dilatation or surrounding inflammatory changes. Spleen: Normal in size without focal abnormality. Adrenals/Urinary Tract: Moderate left hydronephrosis secondary to a obstructing 5 mm stone in the distal ureter just proximal to the ureterovesicular junction (series 2, image 94). Bilateral nonobstructing kidney stones within the calices measuring up to 4 mm in the right kidney. Normal adrenal glands. Normal bladder. Stomach/Bowel: Stomach is within normal limits. Appendix not identified. No evidence of bowel wall thickening, distention, or inflammatory changes. Large volume of stool in the colon. Vascular/Lymphatic: No significant vascular findings are present. No enlarged abdominal or pelvic lymph nodes. Reproductive: Prostate is unremarkable. Other: No abdominal wall hernia or abnormality. No abdominopelvic ascites. Musculoskeletal: Compression deformity of the L1 vertebral body with severe anterior loss of height progressed from prior CT of abdomen and pelvis. Multilevel degenerative changes of the spine. Moderate bilateral hip osteoarthrosis. IMPRESSION: 1. Moderate left hydronephrosis secondary to an obstructing 5 mm stone in the distal  left ureter just proximal to the ureterovesicular junction. 2. Bilateral nonobstructing kidney stones. 3. Clusters of nodules in bronchovascular distribution in the right lung greater than left lung compatible with acute bronchiolitis. 4. L1 compression deformity with severe anterior loss of height progressed from prior study. 5. Large volume of stool in the colon, probable constipation. 6. Additional chronic findings as above. Electronically Signed   By: Kristine Garbe M.D.   On: 02/21/2017 20:52   Dg Abd 1 View  Result Date: 02/22/2017 CLINICAL DATA:  Distal left ureteral calculus and left hydronephrosis on the recent CT. EXAM: ABDOMEN - 1 VIEW COMPARISON:  Abdomen and pelvis CT dated 02/21/2017. FINDINGS: Gas distended colon with prominent stool. The distal colon is not distended and demonstrates some stool. Stable compression deformity of the L1 vertebral body. A left lateral pelvic vascular calcification or phlebolith is unchanged. The previously seen distal left ureteral calculus is not visible. IMPRESSION: 1. The previously seen distal left ureteral calculus is not visible on this examination. 2. Colonic ileus with prominent stool. Electronically Signed   By: Claudie Revering M.D.   On: 02/22/2017 12:02   Ct Chest Wo Contrast  Result Date: 02/21/2017 CLINICAL DATA:  83 y/o  M; weakness, weight loss, and hypotension. EXAM: CT CHEST, ABDOMEN AND PELVIS WITHOUT CONTRAST TECHNIQUE: Multidetector CT imaging of the chest, abdomen and pelvis was performed following the standard protocol without IV contrast. COMPARISON:  12/24/2014 CT of the abdomen and pelvis. FINDINGS: CT CHEST FINDINGS Cardiovascular: Normal caliber thoracic aorta and main pulmonary artery. Mild calcific atherosclerosis of the aortic arch. Normal heart size. No pericardial effusion. Moderate coronary artery calcifications. Mediastinum/Nodes: No enlarged mediastinal, hilar, or axillary lymph nodes. Thyroid and trachea are  unremarkable. Patulous esophagus with contrast opacification. Lungs/Pleura: Clusters of nodules throughout the right lung and bronchovascular distribution compatible with acute bronchiolitis. Mild peribronchial thickening and scattered mucous plugging. No consolidation, effusion, or pneumothorax. Musculoskeletal: Compression deformity of the L1 vertebral body with severe anterior loss of height progressed from prior CT of abdomen and pelvis. Multilevel degenerative changes of the spine. Moderate bilateral hip osteoarthrosis. CT ABDOMEN PELVIS FINDINGS Hepatobiliary: No focal liver abnormality is seen. No gallstones, gallbladder wall thickening, or biliary dilatation. Pancreas: Unremarkable. No pancreatic ductal dilatation or surrounding inflammatory changes. Spleen: Normal in size without focal abnormality. Adrenals/Urinary Tract: Moderate left hydronephrosis secondary to a obstructing 5 mm stone in the distal ureter just  proximal to the ureterovesicular junction (series 2, image 94). Bilateral nonobstructing kidney stones within the calices measuring up to 4 mm in the right kidney. Normal adrenal glands. Normal bladder. Stomach/Bowel: Stomach is within normal limits. Appendix not identified. No evidence of bowel wall thickening, distention, or inflammatory changes. Large volume of stool in the colon. Vascular/Lymphatic: No significant vascular findings are present. No enlarged abdominal or pelvic lymph nodes. Reproductive: Prostate is unremarkable. Other: No abdominal wall hernia or abnormality. No abdominopelvic ascites. Musculoskeletal: Compression deformity of the L1 vertebral body with severe anterior loss of height progressed from prior CT of abdomen and pelvis. Multilevel degenerative changes of the spine. Moderate bilateral hip osteoarthrosis. IMPRESSION: 1. Moderate left hydronephrosis secondary to an obstructing 5 mm stone in the distal left ureter just proximal to the ureterovesicular junction. 2.  Bilateral nonobstructing kidney stones. 3. Clusters of nodules in bronchovascular distribution in the right lung greater than left lung compatible with acute bronchiolitis. 4. L1 compression deformity with severe anterior loss of height progressed from prior study. 5. Large volume of stool in the colon, probable constipation. 6. Additional chronic findings as above. Electronically Signed   By: Kristine Garbe M.D.   On: 02/21/2017 20:52    Assessment: 83 year old man with a symptomatic left distal ureteral stone. The KUB obtained today does not demonstrate an obvious stone. The patient does have severe constipation which is limiting the utility of this KUB. He remains asymptomatic, his white blood cell count is normal, his creatinine is normal.  Given his clinical circumstances, it is very difficult to know whether he has passed a stone or not. I think he would benefit from a bowel cleanout and repeat KUB in the morning.Otherwise, a low dose pelvic CT scan would also help with his management. Given his other comorbidities, he is a very poor surgical candidate, and since he is asymptomatic and nontoxic, would recommend ongoing medical expulsion therapy. Unfortunately, he is not a good candidate for tamsulosin.  Plan: Aggressive bowel regimen to clean out his rectum. Repeat KUB in the a.m. versus the Low-dose pelvic CT scan. Conservative management  We will continue to follow along all the patient remains in the hospital.   Louis Meckel, MD Urology 02/23/2017, 8:53 AM

## 2017-02-23 NOTE — Progress Notes (Signed)
[place] [facility] [place] NAME: Kurt Hill    MR#:  [id number]  DATE OF BIRTH:  16-Jan-1932  SUBJECTIVE:  CHIEF COMPLAINT:  No chief complaint on file.  - feels weak, hb steadily dropping, no active bleeding noted. - urology consult noted.  REVIEW OF SYSTEMS:  Review of Systems  Constitutional: Positive for malaise/fatigue. Negative for chills and fever.  HENT: Negative for ear discharge, ear pain, hearing loss and nosebleeds.   Eyes: Negative for blurred vision.  Respiratory: Negative for cough, shortness of breath and wheezing.   Cardiovascular: Negative for chest pain, palpitations and leg swelling.  Gastrointestinal: Positive for abdominal pain. Negative for constipation, diarrhea, nausea and vomiting.  Genitourinary: Negative for dysuria.  Musculoskeletal: Positive for myalgias.  Neurological: Negative for dizziness, sensory change, speech change, focal weakness, seizures and headaches.    DRUG ALLERGIES:   Allergies  Allergen Reactions  . Ivp Dye [Iodinated Diagnostic Agents] Itching and Rash  . Sulfa Antibiotics Rash    VITALS:  Blood pressure 99/60, pulse 70, temperature 98.6 F (37 C), temperature source Axillary, resp. rate 14, height 5\' 10"  (1.778 m), weight 56.8 kg (125 lb 4.8 oz), SpO2 96 %.  PHYSICAL EXAMINATION:  Physical Exam  GENERAL:  83 y.o.-year-old patient lying in the bed with no acute distress. Appears chronically ill EYES: Pupils equal, round, reactive to light and accommodation. No scleral icterus. Extraocular muscles intact.  HEENT: Head atraumatic, normocephalic. Oropharynx and nasopharynx clear.  NECK:  Supple, no jugular venous distention. No thyroid enlargement, no tenderness.  LUNGS: Normal breath sounds bilaterally, no wheezing, rales,rhonchi or crepitation. No use of accessory muscles of respiration. Decreased bibasilar breath sounds CARDIOVASCULAR: S1, S2 normal. No rubs, or gallops. 2/6 systolic  murmur present ABDOMEN: Soft, nontender, nondistended. Bowel sounds present. No organomegaly or mass.  EXTREMITIES: left lower extremities with flexion contraction, right leg is pale and has 2+ edema.  No cyanosis, or clubbing.  NEUROLOGIC: Cranial nerves II through XII are intact. Muscle strength 4/5 in both upper extremities and 2/5 of lower extremities, . Sensation intact. Gait not checked. Left hand tremors noted. PSYCHIATRIC: The patient is alert and oriented x 3.  SKIN: No obvious rash, lesion, or ulcer.    LABORATORY PANEL:   CBC  Recent Labs Lab 02/22/17 0420  WBC 6.5  HGB 8.0*  HCT 23.6*  PLT 551*   ------------------------------------------------------------------------------------------------------------------  Chemistries   Recent Labs Lab 02/21/17 1459 02/22/17 0420  NA 132* 134*  K 4.3 3.6  CL 101 102  CO2 25 26  GLUCOSE 103* 82  BUN 25* 21*  CREATININE 0.57* 0.59*  CALCIUM 8.7* 8.1*  AST 27  --   ALT 21  --   ALKPHOS 66  --   BILITOT 0.4  --    ------------------------------------------------------------------------------------------------------------------  Cardiac Enzymes No results for input(s): TROPONINI in the last 168 hours. ------------------------------------------------------------------------------------------------------------------  RADIOLOGY:  Ct Abdomen Pelvis Wo Contrast  Result Date: 02/21/2017 CLINICAL DATA:  83 y/o  M; weakness, weight loss, and hypotension. EXAM: CT CHEST, ABDOMEN AND PELVIS WITHOUT CONTRAST TECHNIQUE: Multidetector CT imaging of the chest, abdomen and pelvis was performed following the standard protocol without IV contrast. COMPARISON:  12/24/2014 CT of the abdomen and pelvis. FINDINGS: CT CHEST FINDINGS Cardiovascular: Normal caliber thoracic aorta and main pulmonary artery. Mild calcific atherosclerosis of the aortic arch. Normal heart size. No pericardial effusion. Moderate coronary artery calcifications.  Mediastinum/Nodes: No enlarged mediastinal, hilar, or axillary lymph nodes. Thyroid and  trachea are unremarkable. Patulous esophagus with contrast opacification. Lungs/Pleura: Clusters of nodules throughout the right lung and bronchovascular distribution compatible with acute bronchiolitis. Mild peribronchial thickening and scattered mucous plugging. No consolidation, effusion, or pneumothorax. Musculoskeletal: Compression deformity of the L1 vertebral body with severe anterior loss of height progressed from prior CT of abdomen and pelvis. Multilevel degenerative changes of the spine. Moderate bilateral hip osteoarthrosis. CT ABDOMEN PELVIS FINDINGS Hepatobiliary: No focal liver abnormality is seen. No gallstones, gallbladder wall thickening, or biliary dilatation. Pancreas: Unremarkable. No pancreatic ductal dilatation or surrounding inflammatory changes. Spleen: Normal in size without focal abnormality. Adrenals/Urinary Tract: Moderate left hydronephrosis secondary to a obstructing 5 mm stone in the distal ureter just proximal to the ureterovesicular junction (series 2, image 94). Bilateral nonobstructing kidney stones within the calices measuring up to 4 mm in the right kidney. Normal adrenal glands. Normal bladder. Stomach/Bowel: Stomach is within normal limits. Appendix not identified. No evidence of bowel wall thickening, distention, or inflammatory changes. Large volume of stool in the colon. Vascular/Lymphatic: No significant vascular findings are present. No enlarged abdominal or pelvic lymph nodes. Reproductive: Prostate is unremarkable. Other: No abdominal wall hernia or abnormality. No abdominopelvic ascites. Musculoskeletal: Compression deformity of the L1 vertebral body with severe anterior loss of height progressed from prior CT of abdomen and pelvis. Multilevel degenerative changes of the spine. Moderate bilateral hip osteoarthrosis. IMPRESSION: 1. Moderate left hydronephrosis secondary to an  obstructing 5 mm stone in the distal left ureter just proximal to the ureterovesicular junction. 2. Bilateral nonobstructing kidney stones. 3. Clusters of nodules in bronchovascular distribution in the right lung greater than left lung compatible with acute bronchiolitis. 4. L1 compression deformity with severe anterior loss of height progressed from prior study. 5. Large volume of stool in the colon, probable constipation. 6. Additional chronic findings as above. Electronically Signed   By: Kristine Garbe M.D.   On: 02/21/2017 20:52   Dg Abd 1 View  Result Date: 02/23/2017 CLINICAL DATA:  New ileus. EXAM: ABDOMEN - 1 VIEW COMPARISON:  02/22/2017 and CT 02/21/2017 FINDINGS: Examination demonstrates a a nonobstructive bowel gas pattern with moderate fecal retention throughout the colon and mild residual contrast within the colon from recent CT. Several air-filled nondilated small bowel loops are present. No free peritoneal air. No mass or mass effect. Stable L1 compression fracture. Remainder of the exam is unchanged. IMPRESSION: Nonobstructive bowel gas pattern with moderate fecal retention throughout the colon. Electronically Signed   By: Marin Olp M.D.   On: 02/23/2017 08:55   Dg Abd 1 View  Result Date: 02/22/2017 CLINICAL DATA:  Distal left ureteral calculus and left hydronephrosis on the recent CT. EXAM: ABDOMEN - 1 VIEW COMPARISON:  Abdomen and pelvis CT dated 02/21/2017. FINDINGS: Gas distended colon with prominent stool. The distal colon is not distended and demonstrates some stool. Stable compression deformity of the L1 vertebral body. A left lateral pelvic vascular calcification or phlebolith is unchanged. The previously seen distal left ureteral calculus is not visible. IMPRESSION: 1. The previously seen distal left ureteral calculus is not visible on this examination. 2. Colonic ileus with prominent stool. Electronically Signed   By: Claudie Revering M.D.   On: 02/22/2017 12:02   Ct  Chest Wo Contrast  Result Date: 02/21/2017 CLINICAL DATA:  83 y/o  M; weakness, weight loss, and hypotension. EXAM: CT CHEST, ABDOMEN AND PELVIS WITHOUT CONTRAST TECHNIQUE: Multidetector CT imaging of the chest, abdomen and pelvis was performed following the standard protocol without IV contrast.  COMPARISON:  12/24/2014 CT of the abdomen and pelvis. FINDINGS: CT CHEST FINDINGS Cardiovascular: Normal caliber thoracic aorta and main pulmonary artery. Mild calcific atherosclerosis of the aortic arch. Normal heart size. No pericardial effusion. Moderate coronary artery calcifications. Mediastinum/Nodes: No enlarged mediastinal, hilar, or axillary lymph nodes. Thyroid and trachea are unremarkable. Patulous esophagus with contrast opacification. Lungs/Pleura: Clusters of nodules throughout the right lung and bronchovascular distribution compatible with acute bronchiolitis. Mild peribronchial thickening and scattered mucous plugging. No consolidation, effusion, or pneumothorax. Musculoskeletal: Compression deformity of the L1 vertebral body with severe anterior loss of height progressed from prior CT of abdomen and pelvis. Multilevel degenerative changes of the spine. Moderate bilateral hip osteoarthrosis. CT ABDOMEN PELVIS FINDINGS Hepatobiliary: No focal liver abnormality is seen. No gallstones, gallbladder wall thickening, or biliary dilatation. Pancreas: Unremarkable. No pancreatic ductal dilatation or surrounding inflammatory changes. Spleen: Normal in size without focal abnormality. Adrenals/Urinary Tract: Moderate left hydronephrosis secondary to a obstructing 5 mm stone in the distal ureter just proximal to the ureterovesicular junction (series 2, image 94). Bilateral nonobstructing kidney stones within the calices measuring up to 4 mm in the right kidney. Normal adrenal glands. Normal bladder. Stomach/Bowel: Stomach is within normal limits. Appendix not identified. No evidence of bowel wall thickening,  distention, or inflammatory changes. Large volume of stool in the colon. Vascular/Lymphatic: No significant vascular findings are present. No enlarged abdominal or pelvic lymph nodes. Reproductive: Prostate is unremarkable. Other: No abdominal wall hernia or abnormality. No abdominopelvic ascites. Musculoskeletal: Compression deformity of the L1 vertebral body with severe anterior loss of height progressed from prior CT of abdomen and pelvis. Multilevel degenerative changes of the spine. Moderate bilateral hip osteoarthrosis. IMPRESSION: 1. Moderate left hydronephrosis secondary to an obstructing 5 mm stone in the distal left ureter just proximal to the ureterovesicular junction. 2. Bilateral nonobstructing kidney stones. 3. Clusters of nodules in bronchovascular distribution in the right lung greater than left lung compatible with acute bronchiolitis. 4. L1 compression deformity with severe anterior loss of height progressed from prior study. 5. Large volume of stool in the colon, probable constipation. 6. Additional chronic findings as above. Electronically Signed   By: Kristine Garbe M.D.   On: 02/21/2017 20:52    EKG:   Orders placed or performed during the hospital encounter of 12/03/16  . EKG 12-Lead  . EKG 12-Lead  . EKG 12-Lead  . EKG 12-Lead  . EKG  . EKG    ASSESSMENT AND PLAN:   Kurt Hill  is a 83 y.o. male with a known history of asthma/COPD not on home o2, BPH, CAD, chronic hypotension, hypothyroidism from [facility] rehab sent in from PCP office due to weakness, weight loss and hypotension.  #1 Acute cystitis- urine cultures sent for. - on IV rocephin - CT abd with left ureteral stone and left hydronephrosis- urology consult appreciated. Foley will be placed today - relieve constipation. - generalized deconditioning. wound cultures from left heel unstageable pressure ulcer as well - Dietitian consult - Physical therapy- used to ambulate with a walker  before- discussed with Ortho Dr. Rudene Christians- he said that left leg can be weight bearing as tolerated, no brace/immobiliser or spacer recommended at this time - continue megace.  #2 Hypotension- improving with fluids, could have been from infection. - IV fluids, orthostatics - continue midodrine and florinef  #3 Anemia- anemia of chronic disease. Baseline around 9.5 to 10 - no active bleeding. Patient recollects that his last colonoscopy was normal was almost 5 years ago -  anemia labs. Hb dropped to 8. - no indication for transfusion as long as hb >7 - hematology consulted  #4 Hypothyroidism- on synthroid, elevated TSH, check T4  #5 COPD/ asthma- stable, continue inhalers  #6 DVT Prophylaxis- lovenox- hold with anemia   Patient is from [facility] If not improving, consider palliative care  Updated son on the phone yesterday   All the records are reviewed and case discussed with Care Management/Social Workerr. Management plans discussed with the patient, family and they are in agreement.  CODE STATUS: Full Code  TOTAL TIME TAKING CARE OF THIS PATIENT: 38 minutes.   POSSIBLE D/C IN 2-3 DAYS, DEPENDING ON CLINICAL CONDITION.   Gladstone Lighter M.D on 02/23/2017 at 9:55 AM  Between 7am to 6pm - Pager - [phone]  After 6pm go to [email or web address] - password [email or web address] [facility]  Office  [phone]  CC: Primary care physician; Tracie Harrier, MD

## 2017-02-24 ENCOUNTER — Inpatient Hospital Stay: Payer: Medicare Other

## 2017-02-24 ENCOUNTER — Telehealth: Payer: Self-pay | Admitting: Urology

## 2017-02-24 DIAGNOSIS — R531 Weakness: Secondary | ICD-10-CM

## 2017-02-24 DIAGNOSIS — N201 Calculus of ureter: Secondary | ICD-10-CM

## 2017-02-24 DIAGNOSIS — R627 Adult failure to thrive: Secondary | ICD-10-CM

## 2017-02-24 DIAGNOSIS — R634 Abnormal weight loss: Secondary | ICD-10-CM

## 2017-02-24 DIAGNOSIS — Z7189 Other specified counseling: Secondary | ICD-10-CM

## 2017-02-24 DIAGNOSIS — N200 Calculus of kidney: Secondary | ICD-10-CM

## 2017-02-24 DIAGNOSIS — Z515 Encounter for palliative care: Secondary | ICD-10-CM

## 2017-02-24 LAB — BASIC METABOLIC PANEL
ANION GAP: 6 (ref 5–15)
BUN: 19 mg/dL (ref 6–20)
CO2: 26 mmol/L (ref 22–32)
Calcium: 8.3 mg/dL — ABNORMAL LOW (ref 8.9–10.3)
Chloride: 105 mmol/L (ref 101–111)
Creatinine, Ser: 0.44 mg/dL — ABNORMAL LOW (ref 0.61–1.24)
GFR calc Af Amer: >60 mL/min (ref >60)
GFR calc non Af Amer: >60 mL/min (ref >60)
GLUCOSE: 90 mg/dL (ref 65–99)
POTASSIUM: 3.8 mmol/L (ref 3.5–5.1)
SODIUM: 137 mmol/L (ref 135–145)

## 2017-02-24 LAB — CBC
HCT: 24 % — ABNORMAL LOW (ref 40.0–52.0)
HEMOGLOBIN: 8 g/dL — AB (ref 13.0–18.0)
MCH: 30.2 pg (ref 26.0–34.0)
MCHC: 33.2 g/dL (ref 32.0–36.0)
MCV: 91.1 fL (ref 80.0–100.0)
PLATELETS: 540 10*3/uL — AB (ref 150–440)
RBC: 2.63 MIL/uL — AB (ref 4.40–5.90)
RDW: 17.8 % — ABNORMAL HIGH (ref 11.5–14.5)
WBC: 7.1 10*3/uL (ref 3.8–10.6)

## 2017-02-24 MED ORDER — CEFUROXIME AXETIL 250 MG PO TABS
500.0000 mg | ORAL_TABLET | Freq: Two times a day (BID) | ORAL | Status: DC
  Administered 2017-02-24 – 2017-02-25 (×2): 500 mg via ORAL
  Filled 2017-02-24: qty 2

## 2017-02-24 MED ORDER — FLUDROCORTISONE ACETATE 0.1 MG PO TABS
0.2000 mg | ORAL_TABLET | Freq: Every day | ORAL | Status: DC
  Administered 2017-02-25 – 2017-02-26 (×2): 0.2 mg via ORAL
  Filled 2017-02-24 (×2): qty 2

## 2017-02-24 NOTE — Consult Note (Signed)
Consultation Note Date: 02/24/2017   Patient Name: Kurt Hill  DOB: 1932-07-03  MRN: [id number]  Age / Sex: 83 y.o., male  PCP: Tracie Harrier, MD Referring Physician: Gladstone Lighter, MD  Reason for Consultation: Establishing goals of care and Psychosocial/spiritual support  HPI/Patient Profile:   83 y.o. male  admitted on 02/21/2017  with a known history of asthma/COPD not on home o2, BPH, CAD, chronic hypotension, hypothyroidism from [facility] rehab sent in from PCP office due to weakness, weight loss and hypotension.  Over the last couple of months, patient has been very weak, decreased oral intake. His hypotension is getting worse. Went to see his PCP in the office last week and was noted to have drop in hemoglobin from baseline.   He is admitted for further workup of his weight loss and his weakness.   H/o recent knee surgery left patella ORIF-with failed orthopedic implant-currently being treated conservatively. Before his knee surgeries last year he was able to ambulate with a walker, but over the last 2 months he hasn't gotten out of bed.  Per nursing he is known to not fully participate in his care plan, refusing treatments and care   He faces the limitations of medical interventions, his own mortality and advanced directive decisions  Clinical Assessment and Goals of Care:   This NP Wadie Lessen reviewed medical records, received report from team, assessed the patient and then meet at the patient's bedside   to discuss diagnosis, prognosis, GOC, EOL wishes disposition and options.  A detailed discussion was had today regarding advanced directives.  Concepts specific to code status, artifical feeding and hydration, continued IV antibiotics and rehospitalization was had.  The difference between a aggressive medical intervention path  and a palliative comfort care path for this  patient at this time was had.  Values and goals of care important to patient and family were attempted to be elicited.  We discussed the pateint's role in his treatment plan and overall well being.  Mr Manalang got aggravated with this discussion.    Concept of  Palliative Care was discussed.      Questions and concerns addressed.   Family encouraged to call with questions or concerns.  PMT will continue to support holistically.  PATIENT    I left message for son to call me regarding above conversation    SUMMARY OF RECOMMENDATIONS    Code Status/Advance Care Planning:   Full code-encouraged to consider DNR status knowing poor outcomes in similar patients    Symptom Management:   Weakness and continued loss of independent ADL, specific to self feed:  OT evaluation and treatment written  Palliative Prophylaxis:   Aspiration, Bowel Regimen, Delirium Protocol, Frequent Pain Assessment and Oral Care  Additional Recommendations (Limitations, Scope, Preferences):  Full Scope Treatment  Psycho-social/Spiritual:   Desire for further Chaplaincy support:no  Additional Recommendations: Education on Hospice  Prognosis:   Unable to determine  Discharge Planning: [facility] for rehab with Palliative care service follow-up  Primary Diagnoses: Present on Admission: . Anemia   I have reviewed the medical record, interviewed the patient and family, and examined the patient. The following aspects are pertinent.  Past Medical History:  Diagnosis Date  . Anemia   . Asthma   . BPH (benign prostatic hyperplasia)   . COPD (chronic obstructive pulmonary disease) (HCC)    not on home oxygen  . Coronary artery disease   . Hypotension   . Hypothyroidism   . Skin abnormalities   . Skin cancer    Social History   Social History  . Marital status: Widowed    Spouse name: N/A  . Number of children: N/A  . Years of education: N/A   Social History Main Topics  .  Smoking status: Never Smoker  . Smokeless tobacco: Never Used     Comment: tried cigarettes when young- but never really smoked  . Alcohol use No  . Drug use: No  . Sexual activity: No   Other Topics Concern  . None   Social History Narrative   But has been at [facility] rehab now for a few months.   Family History  Problem Relation Age of Onset  . CAD Other   . Diabetes Mother    Scheduled Meds: . aspirin EC  81 mg Oral q morning - 10a  . bisacodyl  10 mg Oral Daily  . cefUROXime  500 mg Oral BID WC  . Chlorhexidine Gluconate Cloth  6 each Topical Q0600  . collagenase   Topical Daily  . feeding supplement (ENSURE ENLIVE)  237 mL Oral TID BM  . finasteride  5 mg Oral q morning - 10a  . [START ON 02/25/2017] fludrocortisone  0.2 mg Oral Daily  . levothyroxine  100 mcg Oral Daily  . megestrol  40 mg Oral Daily  . metoCLOPramide  5 mg Oral TID AC & HS  . midodrine  10 mg Oral TID WC  . mometasone-formoterol  2 puff Inhalation BID  . mupirocin ointment  1 application Nasal BID  . polyethylene glycol  17 g Oral Daily  . senna-docusate  2 tablet Oral BID  . sertraline  25 mg Oral Daily  . tamsulosin  0.4 mg Oral Daily   Continuous Infusions: PRN Meds:.acetaminophen **OR** acetaminophen, albuterol, alum & mag hydroxide-simeth, ondansetron **OR** ondansetron (ZOFRAN) IV, oxyCODONE, phenol-menthol, sodium phosphate Medications Prior to Admission:  Prior to Admission medications   Medication Sig Start Date End Date Taking? Authorizing Provider  acetaminophen (TYLENOL) 325 MG tablet Take by mouth every 4 (four) hours as needed.    Historical Provider, MD  albuterol (PROAIR HFA) 108 (90 Base) MCG/ACT inhaler Inhale 2 puffs into the lungs every 6 (six) hours as needed for wheezing or shortness of breath.  12/06/15 12/05/16  Historical Provider, MD  albuterol (PROVENTIL) (2.5 MG/3ML) 0.083% nebulizer solution Take 2.5 mg by nebulization every 6 (six) hours as needed for  wheezing or shortness of breath.    Historical Provider, MD  alum & mag hydroxide-simeth (MYLANTA) 200-200-20 MG/5ML suspension Take 30 mLs by mouth every 6 (six) hours as needed for indigestion or heartburn.    Historical Provider, MD  aspirin EC 81 MG tablet Take 81 mg by mouth every morning.    Historical Provider, MD  bisacodyl (DULCOLAX) 5 MG EC tablet Take 1 tablet (5 mg total) by mouth daily as needed for moderate constipation. 12/07/16   Lytle Butte, MD  budesonide-formoterol [facility]) 80-4.5 MCG/ACT inhaler Inhale 2 puffs  into the lungs 2 (two) times daily.    Historical Provider, MD  feeding supplement, ENSURE ENLIVE, (ENSURE ENLIVE) LIQD Take 237 mLs by mouth 2 (two) times daily between meals. 10/19/16   Bettey Costa, MD  finasteride (PROSCAR) 5 MG tablet Take 5 mg by mouth every morning. 03/12/16 03/12/17  Historical Provider, MD  fludrocortisone (FLORINEF) 0.1 MG tablet Take 0.1 mg by mouth daily.    Historical Provider, MD  lactulose (CHRONULAC) 10 GM/15ML solution Take 45 mLs (30 g total) by mouth daily as needed for moderate constipation. 09/17/16   Paulette Blanch, MD  levothyroxine (SYNTHROID, LEVOTHROID) 100 MCG tablet Take 1 tablet by mouth daily. 09/04/15   Historical Provider, MD  megestrol (MEGACE) 40 MG tablet Take 40 mg by mouth daily.    Historical Provider, MD  megestrol (MEGACE) 40 MG tablet Take 1 tablet (40 mg total) by mouth daily. 12/07/16   Lytle Butte, MD  metoCLOPramide (REGLAN) 10 MG tablet Take 10 mg by mouth 4 (four) times daily -  before meals and at bedtime.    Historical Provider, MD  midodrine (PROAMATINE) 5 MG tablet Take 1 tablet (5 mg total) by mouth 3 (three) times daily with meals. Patient taking differently: Take 10 mg by mouth 3 (three) times daily with meals.  10/19/16   Sital Mody, MD  ondansetron (ZOFRAN) 4 MG tablet Take 4 mg by mouth every 8 (eight) hours as needed for nausea or vomiting.    Historical Provider, MD  oxyCODONE (OXY IR/ROXICODONE) 5 MG  immediate release tablet Take 1 tablet (5 mg total) by mouth every 4 (four) hours as needed for moderate pain or severe pain. 10/19/16   Bettey Costa, MD  oxyCODONE (OXY IR/ROXICODONE) 5 MG immediate release tablet Take 1 tablet (5 mg total) by mouth every 4 (four) hours as needed for moderate pain. 12/07/16   Lytle Butte, MD  polyethylene glycol [facility] Floria Raveling) packet Take 17 g by mouth daily. 10/20/16   Bettey Costa, MD  sennosides-docusate sodium (SENOKOT-S) 8.6-50 MG tablet Take 2 tablets by mouth 2 (two) times daily.    Historical Provider, MD  sertraline (ZOLOFT) 25 MG tablet Take 25 mg by mouth daily.    Historical Provider, MD  tamsulosin (FLOMAX) 0.4 MG CAPS capsule Take 1 capsule (0.4 mg total) by mouth daily. 12/07/16   Lytle Butte, MD  Vitamin D, Ergocalciferol, (DRISDOL) 50000 units CAPS capsule Take 1 capsule by mouth every 7 (seven) days. 09/04/15   Historical Provider, MD   Allergies  Allergen Reactions  . Ivp Dye [Iodinated Diagnostic Agents] Itching and Rash  . Sulfa Antibiotics Rash   Review of Systems    Physical Exam  Constitutional: He appears cachectic. He appears ill.  HENT:  Mouth/Throat: Dental caries present.  - thick dark coating on tongue  Cardiovascular: Normal rate, regular rhythm and normal heart sounds.   Pulmonary/Chest: He has decreased breath sounds in the right lower field and the left lower field.  Neurological: He is alert.  Skin: Skin is warm and dry.    Vital Signs: BP (!) 109/59 (BP Location: Right Arm)   Pulse 86   Temp 98.3 F (36.8 C) (Axillary)   Resp 16   Ht 5\' 10"  (1.778 m)   Wt 56.8 kg (125 lb 4.8 oz)   SpO2 97%   BMI 17.98 kg/m  Pain Assessment: 0-10 POSS *See Group Information*: 1-Acceptable,Awake and alert Pain Score: 3    SpO2: SpO2: 97 % O2  Device:SpO2: 97 % O2 Flow Rate: .   IO: Intake/output summary:  Intake/Output Summary (Last 24 hours) at 02/24/17 1648 Last data filed at 02/24/17 6153  Gross per 24 hour    Intake              450 ml  Output             2125 ml  Net            -1675 ml    LBM: Last BM Date: 02/24/17 Baseline Weight: Weight: 66.7 kg (147 lb 0.8 oz) Most recent weight: Weight: 56.8 kg (125 lb 4.8 oz)      Palliative Assessment/Data: 30 % at best   Flowsheet Rows     Most Recent Value  Intake Tab  Referral Department  Hospitalist  Unit at Time of Referral  Orthopedic Unit  Palliative Care Primary Diagnosis  Pulmonary  Date Notified  02/24/17  Palliative Care Type  New Palliative care  Reason for referral  Clarify Goals of Care  Date of Admission  02/21/17  # of days IP prior to Palliative referral  3  Clinical Assessment  Psychosocial & Spiritual Assessment  Palliative Care Outcomes     Discussed with Dr Tressia Miners  Time In: 1415 Time Out: 1530 Time Total: 75 min Greater than 50%  of this time was spent counseling and coordinating care related to the above assessment and plan.  Signed by: Wadie Lessen, NP   Please contact Palliative Medicine Team phone at [phone] for questions and concerns.  For individual provider: See Shea [name]

## 2017-02-24 NOTE — Progress Notes (Signed)
Urology Consult Follow Up  Subjective: Eating breakfast. No complaints including no flank pain, nausea, or vomiting.  Anti-infectives: Anti-infectives    Start     Dose/Rate Route Frequency Ordered Stop   02/22/17 0900  cefTRIAXone (ROCEPHIN) 1 g in dextrose 5 % 50 mL IVPB     1 g 100 mL/hr over 30 Minutes Intravenous Every 24 hours 02/22/17 0752        Current Facility-Administered Medications  Medication Dose Route Frequency Provider Last Rate Last Dose  . acetaminophen (TYLENOL) tablet 650 mg  650 mg Oral Q6H PRN Gladstone Lighter, MD       Or  . acetaminophen (TYLENOL) suppository 650 mg  650 mg Rectal Q6H PRN Gladstone Lighter, MD      . albuterol (PROVENTIL) (2.5 MG/3ML) 0.083% nebulizer solution 2.5 mg  2.5 mg Nebulization Q6H PRN Gladstone Lighter, MD      . alum & mag hydroxide-simeth (MAALOX/MYLANTA) 200-200-20 MG/5ML suspension 30 mL  30 mL Oral Q6H PRN Gladstone Lighter, MD      . aspirin EC tablet 81 mg  81 mg Oral q morning - 10a Gladstone Lighter, MD   81 mg at 02/24/17 0909  . bisacodyl (DULCOLAX) EC tablet 10 mg  10 mg Oral Daily Gladstone Lighter, MD   10 mg at 02/24/17 0909  . cefTRIAXone (ROCEPHIN) 1 g in dextrose 5 % 50 mL IVPB  1 g Intravenous Q24H Gladstone Lighter, MD   1 g at 02/24/17 0913  . Chlorhexidine Gluconate Cloth 2 % PADS 6 each  6 each Topical Q0600 Gladstone Lighter, MD   6 each at 02/24/17 0600  . collagenase (SANTYL) ointment   Topical Daily Gladstone Lighter, MD      . feeding supplement (ENSURE ENLIVE) (ENSURE ENLIVE) liquid 237 mL  237 mL Oral TID BM Gladstone Lighter, MD   237 mL at 02/24/17 0912  . finasteride (PROSCAR) tablet 5 mg  5 mg Oral q morning - 10a Gladstone Lighter, MD   5 mg at 02/24/17 0909  . fludrocortisone (FLORINEF) tablet 0.1 mg  0.1 mg Oral Daily Gladstone Lighter, MD   0.1 mg at 02/24/17 0909  . levothyroxine (SYNTHROID, LEVOTHROID) tablet 100 mcg  100 mcg Oral Daily Gladstone Lighter, MD   100 mcg at 02/24/17 0542  .  megestrol (MEGACE) tablet 40 mg  40 mg Oral Daily Gladstone Lighter, MD   40 mg at 02/24/17 0909  . metoCLOPramide (REGLAN) tablet 5 mg  5 mg Oral TID AC & HS Gladstone Lighter, MD   5 mg at 02/24/17 0909  . midodrine (PROAMATINE) tablet 10 mg  10 mg Oral TID WC Gladstone Lighter, MD   10 mg at 02/24/17 0908  . mometasone-formoterol (DULERA) 100-5 MCG/ACT inhaler 2 puff  2 puff Inhalation BID Gladstone Lighter, MD   2 puff at 02/24/17 0908  . mupirocin ointment (BACTROBAN) 2 % 1 application  1 application Nasal BID Gladstone Lighter, MD   1 application at 50/93/26 0910  . ondansetron (ZOFRAN) tablet 4 mg  4 mg Oral Q6H PRN Gladstone Lighter, MD       Or  . ondansetron (ZOFRAN) injection 4 mg  4 mg Intravenous Q6H PRN Gladstone Lighter, MD      . oxyCODONE (Oxy IR/ROXICODONE) immediate release tablet 5 mg  5 mg Oral Q4H PRN Gladstone Lighter, MD   5 mg at 02/22/17 1731  . phenol-menthol (CEPASTAT) lozenge 1 lozenge  1 lozenge Buccal PRN Gladstone Lighter, MD      .  polyethylene glycol (MIRALAX / GLYCOLAX) packet 17 g  17 g Oral Daily Gladstone Lighter, MD   17 g at 02/24/17 0909  . senna-docusate (Senokot-S) tablet 2 tablet  2 tablet Oral BID Gladstone Lighter, MD   2 tablet at 02/24/17 0908  . sertraline (ZOLOFT) tablet 25 mg  25 mg Oral Daily Gladstone Lighter, MD   25 mg at 02/24/17 0908  . sodium phosphate (FLEET) 7-19 GM/118ML enema 1 enema  1 enema Rectal Once PRN Gladstone Lighter, MD      . tamsulosin (FLOMAX) capsule 0.4 mg  0.4 mg Oral Daily Gladstone Lighter, MD   0.4 mg at 02/24/17 0909  . traMADol (ULTRAM) tablet 50 mg  50 mg Oral Q6H PRN Gladstone Lighter, MD         Objective: Vital signs in last 24 hours: Temp:  [98.3 F (36.8 C)-98.4 F (36.9 C)] 98.3 F (36.8 C) (03/18 1942) Pulse Rate:  [84-86] 86 (03/19 0918) Resp:  [16-18] 16 (03/19 0918) BP: (92-109)/(49-59) 109/59 (03/19 0918) SpO2:  [95 %-97 %] 97 % (03/19 0918)  Intake/Output from previous day: 03/18 0701 -  03/19 0700 In: 590 [P.O.:590] Out: 2125 [Urine:2125] Intake/Output this shift: No intake/output data recorded.   Physical Exam  Constitutional: He is oriented to person, place, and time and well-developed, well-nourished, and in no distress.  Abdominal: Soft.  Genitourinary:  Genitourinary Comments: No CVA or SPT tenderness  Foley draining clear yellow urine  Neurological: He is alert and oriented to person, place, and time.  Skin: Skin is warm and dry.    Lab Results:   Recent Labs  02/22/17 0420 02/24/17 0338  WBC 6.5 7.1  HGB 8.0* 8.0*  HCT 23.6* 24.0*  PLT 551* 540*   BMET  Recent Labs  02/22/17 0420 02/24/17 0338  NA 134* 137  K 3.6 3.8  CL 102 105  CO2 26 26  GLUCOSE 82 90  BUN 21* 19  CREATININE 0.59* 0.44*  CALCIUM 8.1* 8.3*    Studies/Results: Dg Abd 1 View  Result Date: 02/24/2017 CLINICAL DATA:  Abdominal pain with concern for ureteral calculus EXAM: ABDOMEN - 1 VIEW COMPARISON:  February 23, 2017 FINDINGS: The there is mild colonic dilatation. There is moderate contrast in the colon. Small bowel is not dilated. There are calcifications in the pelvis, likely phleboliths. No other abnormal calcifications are evident. There is anterior wedging of the L1 vertebral body, stable. IMPRESSION: Question a degree of colonic ileus. No ureteral calculi evident by radiography. Probable phleboliths in the pelvis. Stable anterior wedging L1 vertebral body. Electronically Signed   By: Lowella Grip III M.D.   On: 02/24/2017 09:59   Dg Abd 1 View  Result Date: 02/23/2017 CLINICAL DATA:  New ileus. EXAM: ABDOMEN - 1 VIEW COMPARISON:  02/22/2017 and CT 02/21/2017 FINDINGS: Examination demonstrates a a nonobstructive bowel gas pattern with moderate fecal retention throughout the colon and mild residual contrast within the colon from recent CT. Several air-filled nondilated small bowel loops are present. No free peritoneal air. No mass or mass effect. Stable L1 compression  fracture. Remainder of the exam is unchanged. IMPRESSION: Nonobstructive bowel gas pattern with moderate fecal retention throughout the colon. Electronically Signed   By: Marin Olp M.D.   On: 02/23/2017 08:55    Assessment: 83 year old man with an asymptomatic left distal ureteral stone.    Given his other comorbidities, he is a very poor surgical candidate, and since he is asymptomatic and nontoxic, would recommend ongoing medical expulsion  therapy. Unfortunately, he is not a good candidate for tamsulosin.   Stone unable to be seen again today on KUB. Unclear stone has passed.  Plan: -Given the small size of the stone and no signs or symptoms of infection, okay to discharge patient with plan for expectant management of the stone -Will arrange for repeat stone protocol CT scan in 1-2 weeks following discharge to reassess for stone passage -Okay to DC Foley prior to discharge -Case was discussed with Dr. Tressia Miners is agreeable with this plan     LOS: 3 days    Hollice Espy 02/24/2017

## 2017-02-24 NOTE — Telephone Encounter (Signed)
This patient needs urologic follow-up with any provider in our office in 1-2 weeks with a CT pelvis just prior to follow-up. I have placed the order.  Hollice Espy, MD

## 2017-02-24 NOTE — Progress Notes (Signed)
[facility] [place] NAME: Kurt Hill    MR#:  [id number]  DATE OF BIRTH:  1932/11/22  SUBJECTIVE:  CHIEF COMPLAINT:  No chief complaint on file.  - Hemoglobin is still low, blood pressures on the borderline. -Denies any complaints.  REVIEW OF SYSTEMS:  Review of Systems  Constitutional: Positive for malaise/fatigue. Negative for chills and fever.  HENT: Negative for ear discharge, ear pain, hearing loss and nosebleeds.   Eyes: Negative for blurred vision.  Respiratory: Negative for cough, shortness of breath and wheezing.   Cardiovascular: Negative for chest pain, palpitations and leg swelling.  Gastrointestinal: Negative for abdominal pain, constipation, diarrhea, nausea and vomiting.  Genitourinary: Negative for dysuria.  Musculoskeletal: Positive for myalgias.  Neurological: Negative for dizziness, sensory change, speech change, focal weakness, seizures and headaches.    DRUG ALLERGIES:   Allergies  Allergen Reactions  . Ivp Dye [Iodinated Diagnostic Agents] Itching and Rash  . Sulfa Antibiotics Rash    VITALS:  Blood pressure (!) 109/59, pulse 86, temperature 98.3 F (36.8 C), temperature source Axillary, resp. rate 16, height 5' 10"  (1.778 m), weight 56.8 kg (125 lb 4.8 oz), SpO2 97 %.  PHYSICAL EXAMINATION:  Physical Exam  GENERAL:  83 y.o.-year-old patient lying in the bed with no acute distress. Appears chronically ill EYES: Pupils equal, round, reactive to light and accommodation. No scleral icterus. Extraocular muscles intact.  HEENT: Head atraumatic, normocephalic. Oropharynx and nasopharynx clear.  NECK:  Supple, no jugular venous distention. No thyroid enlargement, no tenderness.  LUNGS: Normal breath sounds bilaterally, no wheezing, rales,rhonchi or crepitation. No use of accessory muscles of respiration. Decreased bibasilar breath sounds CARDIOVASCULAR: S1, S2 normal. No rubs, or gallops. 2/6 systolic murmur  present ABDOMEN: Soft, nontender, nondistended. Bowel sounds present. No organomegaly or mass.  EXTREMITIES: left lower extremities with flexion contraction, right leg is pale and has 2+ edema.  No cyanosis, or clubbing.  NEUROLOGIC: Cranial nerves II through XII are intact. Muscle strength 4/5 in both upper extremities and 2/5 of lower extremities, . Sensation intact. Gait not checked.  PSYCHIATRIC: The patient is alert and oriented x 3.  SKIN: No obvious rash, lesion, or ulcer.    LABORATORY PANEL:   CBC  Recent Labs Lab 02/24/17 0338  WBC 7.1  HGB 8.0*  HCT 24.0*  PLT 540*   ------------------------------------------------------------------------------------------------------------------  Chemistries   Recent Labs Lab 02/21/17 1459  02/24/17 0338  NA 132*  < > 137  K 4.3  < > 3.8  CL 101  < > 105  CO2 25  < > 26  GLUCOSE 103*  < > 90  BUN 25*  < > 19  CREATININE 0.57*  < > 0.44*  CALCIUM 8.7*  < > 8.3*  AST 27  --   --   ALT 21  --   --   ALKPHOS 66  --   --   BILITOT 0.4  --   --   < > = values in this interval not displayed. ------------------------------------------------------------------------------------------------------------------  Cardiac Enzymes No results for input(s): TROPONINI in the last 168 hours. ------------------------------------------------------------------------------------------------------------------  RADIOLOGY:  Dg Abd 1 View  Result Date: 02/24/2017 CLINICAL DATA:  Abdominal pain with concern for ureteral calculus EXAM: ABDOMEN - 1 VIEW COMPARISON:  February 23, 2017 FINDINGS: The there is mild colonic dilatation. There is moderate contrast in the colon. Small bowel is not dilated. There are calcifications in the pelvis, likely phleboliths. No other abnormal calcifications  are evident. There is anterior wedging of the L1 vertebral body, stable. IMPRESSION: Question a degree of colonic ileus. No ureteral calculi evident by radiography.  Probable phleboliths in the pelvis. Stable anterior wedging L1 vertebral body. Electronically Signed   By: Lowella Grip III M.D.   On: 02/24/2017 09:59   Dg Abd 1 View  Result Date: 02/23/2017 CLINICAL DATA:  New ileus. EXAM: ABDOMEN - 1 VIEW COMPARISON:  02/22/2017 and CT 02/21/2017 FINDINGS: Examination demonstrates a a nonobstructive bowel gas pattern with moderate fecal retention throughout the colon and mild residual contrast within the colon from recent CT. Several air-filled nondilated small bowel loops are present. No free peritoneal air. No mass or mass effect. Stable L1 compression fracture. Remainder of the exam is unchanged. IMPRESSION: Nonobstructive bowel gas pattern with moderate fecal retention throughout the colon. Electronically Signed   By: Marin Olp M.D.   On: 02/23/2017 08:55    EKG:   Orders placed or performed during the hospital encounter of 12/03/16  . EKG 12-Lead  . EKG 12-Lead  . EKG 12-Lead  . EKG 12-Lead  . EKG  . EKG    ASSESSMENT AND PLAN:   Adolpho Meenach  is a 83 y.o. male with a known history of asthma/COPD not on home o2, BPH, CAD, chronic hypotension, hypothyroidism from [facility] rehab sent in from PCP office due to weakness, weight loss and hypotension.  #1 Acute cystitis- urine cultures Are negative. Wound cultures from his left heel ulcer growing some gram positives. -Change IV Rocephin to oral Ceftin - CT abd with left ureteral stone and left hydronephrosis- urology consult appreciated. -Foley can be discontinued at this time. KUB with no ureteral stones seen. Outpatient CT again. -- relieve constipation. - generalized deconditioning. wound cultures from left heel unstageable pressure ulcer as well - Dietitian consult - Physical therapy- used to ambulate with a walker before- discussed with Ortho Dr. Rudene Christians- he said that left leg can be weight bearing as tolerated, no brace/immobiliser or spacer recommended at this time - continue  megace.  #2 Hypotension- improving with fluids, could have been from infection. - continue midodrine and florinef-Increased Florinef dosage  #3 Anemia- anemia of chronic disease. Baseline around 9.5 to 10 - no active bleeding. Patient recollects that his last colonoscopy was normal was almost 5 years ago - anemia labs Within normal limits.  - no indication for transfusion as long as hb >7.5 - Appreciate hematology consult. No indication for bone marrow biopsy at this time.  #4 Hypothyroidism- on synthroid, elevated TSH, normal free T4  #5 COPD/ asthma- stable, continue inhalers  #6 DVT Prophylaxis- lovenox- hold with anemia   Patient is from [place] care consult   All the records are reviewed and case discussed with Care Management/Social Workerr. Management plans discussed with the patient, family and they are in agreement.  CODE STATUS: Full Code  TOTAL TIME TAKING CARE OF THIS PATIENT: 37 minutes.   POSSIBLE D/C in 1-2 days, DEPENDING ON CLINICAL CONDITION.   Gladstone Lighter M.D on 02/24/2017 at 2:53 PM  Between 7am to 6pm - Pager - [phone]  After 6pm go to [email or web address] - password [email or web address] [facility]  Office  [phone]  CC: Primary care physician; Tracie Harrier, MD

## 2017-02-25 DIAGNOSIS — R531 Weakness: Secondary | ICD-10-CM

## 2017-02-25 DIAGNOSIS — R627 Adult failure to thrive: Secondary | ICD-10-CM

## 2017-02-25 DIAGNOSIS — Z7189 Other specified counseling: Secondary | ICD-10-CM

## 2017-02-25 DIAGNOSIS — Z515 Encounter for palliative care: Secondary | ICD-10-CM

## 2017-02-25 LAB — ABO/RH: ABO/RH(D): O POS

## 2017-02-25 LAB — CBC
HCT: 23 % — ABNORMAL LOW (ref 40.0–52.0)
Hemoglobin: 7.9 g/dL — ABNORMAL LOW (ref 13.0–18.0)
MCH: 31.1 pg (ref 26.0–34.0)
MCHC: 34.2 g/dL (ref 32.0–36.0)
MCV: 90.8 fL (ref 80.0–100.0)
PLATELETS: 549 10*3/uL — AB (ref 150–440)
RBC: 2.53 MIL/uL — AB (ref 4.40–5.90)
RDW: 17.4 % — AB (ref 11.5–14.5)
WBC: 6.6 10*3/uL (ref 3.8–10.6)

## 2017-02-25 LAB — HEMOGLOBIN: Hemoglobin: 9.3 g/dL — ABNORMAL LOW (ref 13.0–18.0)

## 2017-02-25 LAB — KAPPA/LAMBDA LIGHT CHAINS
KAPPA FREE LGHT CHN: 52 mg/L — AB (ref 3.3–19.4)
Kappa, lambda light chain ratio: 0.92 (ref 0.26–1.65)
LAMDA FREE LIGHT CHAINS: 56.8 mg/L — AB (ref 5.7–26.3)

## 2017-02-25 LAB — PREPARE RBC (CROSSMATCH)

## 2017-02-25 LAB — HAPTOGLOBIN: Haptoglobin: 337 mg/dL — ABNORMAL HIGH (ref 34–200)

## 2017-02-25 MED ORDER — VANCOMYCIN HCL IN DEXTROSE 1-5 GM/200ML-% IV SOLN
1000.0000 mg | Freq: Once | INTRAVENOUS | Status: AC
  Administered 2017-02-25: 1000 mg via INTRAVENOUS
  Filled 2017-02-25: qty 200

## 2017-02-25 MED ORDER — VANCOMYCIN HCL 500 MG IV SOLR
500.0000 mg | Freq: Two times a day (BID) | INTRAVENOUS | Status: DC
  Administered 2017-02-26: 500 mg via INTRAVENOUS
  Filled 2017-02-25 (×4): qty 500

## 2017-02-25 MED ORDER — SODIUM CHLORIDE 0.9 % IV SOLN: Freq: Once | INTRAVENOUS | Status: DC

## 2017-02-25 NOTE — Progress Notes (Signed)
Daily Progress Note   Patient Name: Kurt Hill       Date: 02/25/2017 DOB: 06-01-1932  Age: 83 y.o. MRN#: [id number] Attending Physician: Gladstone Lighter, MD Primary Care Physician: Tracie Harrier, MD Admit Date: 02/21/2017  Reason for Consultation/Follow-up: Establishing goals of care and Psychosocial/spiritual support  Subjective:  -spoke to son last night for 30 minutes discussing  to discuss diagnosis, prognosis, GOC, EOL wishes disposition and options.  -continued discussion with patient at his bedside this morning.   The difference between a aggressive medical intervention path  and a palliative comfort care path for this patient at this time was had.  Values and goals of care important to patient and family were attempted to be elicited.  - stressed the importance of patient and family continuing conversation and then documentation of his advanced directive decisions and EOL wishes  - patient verbalizes frustration with "the whole situation" .  This is a very difficult time for him losing his physical and functional capacity and his independence   Questions and concerns addressed.   Family encouraged to call with questions or concerns.  PMT will continue to support holistically.    Length of Stay: 4  Current Medications: Scheduled Meds:  . aspirin EC  81 mg Oral q morning - 10a  . bisacodyl  10 mg Oral Daily  . cefUROXime  500 mg Oral BID WC  . Chlorhexidine Gluconate Cloth  6 each Topical Q0600  . collagenase   Topical Daily  . feeding supplement (ENSURE ENLIVE)  237 mL Oral TID BM  . finasteride  5 mg Oral q morning - 10a  . fludrocortisone  0.2 mg Oral Daily  . levothyroxine  100 mcg Oral Daily  . megestrol  40 mg Oral Daily  . metoCLOPramide  5 mg Oral TID AC &  HS  . midodrine  10 mg Oral TID WC  . mometasone-formoterol  2 puff Inhalation BID  . mupirocin ointment  1 application Nasal BID  . polyethylene glycol  17 g Oral Daily  . senna-docusate  2 tablet Oral BID  . sertraline  25 mg Oral Daily  . tamsulosin  0.4 mg Oral Daily    Continuous Infusions:   PRN Meds: acetaminophen **OR** acetaminophen, albuterol, alum & mag hydroxide-simeth, ondansetron **OR** ondansetron (ZOFRAN) IV, oxyCODONE, phenol-menthol, sodium phosphate  Physical Exam  Constitutional: He appears lethargic. He appears cachectic. He appears ill.  HENT:  Mouth/Throat: Mucous membranes are dry.  Cardiovascular: Normal rate, regular rhythm and normal heart sounds.   Pulmonary/Chest: He has decreased breath sounds in the right lower field and the left lower field.  Neurological: He appears lethargic.  Skin: Skin is warm and dry. Ecchymosis noted.            Vital Signs: BP (!) 118/58 (BP Location: Right Arm)   Pulse 80   Temp 98.3 F (36.8 C) (Oral)   Resp 18   Ht 5\' 10"  (1.778 m)   Wt 56.8 kg (125 lb 4.8 oz)   SpO2 98%   BMI 17.98 kg/m  SpO2: SpO2: 98 % O2 Device: O2 Device: Not Delivered O2 Flow Rate:    Intake/output summary: No intake or output data in the 24 hours ending 02/25/17 0925 LBM: Last BM Date: 02/24/17 Baseline Weight: Weight: 66.7 kg (147 lb 0.8 oz) Most recent weight: Weight: 56.8 kg (125 lb 4.8 oz)       Palliative Assessment/Data:  30 % at best    Flowsheet Rows     Most Recent Value  Intake Tab  Referral Department  Hospitalist  Unit at Time of Referral  Orthopedic Unit  Palliative Care Primary Diagnosis  Pulmonary  Date Notified  02/24/17  Palliative Care Type  New Palliative care  Reason for referral  Clarify Goals of Care  Date of Admission  02/21/17  Date first seen by Palliative Care  02/24/17  # of days Palliative referral response time  0 Day(s)  # of days IP prior to Palliative referral  3  Clinical Assessment    Psychosocial & Spiritual Assessment  Palliative Care Outcomes      Patient Active Problem List   Diagnosis Date Noted  . Anemia 02/21/2017  . Protein-calorie malnutrition, severe 12/04/2016  . Pressure injury of skin 10/13/2016  . Cellulitis of left leg 08/30/2016  . Post op infection 08/30/2016  . Cellulitis 08/30/2016  . Left patella fracture 08/15/2016  . Patella fracture 07/30/2016  . COPD exacerbation ([place]) 12/14/2015    Palliative Care Assessment & Plan    Recommendations/Plan:  SNF for rehab and PCS to follow  Goals of Care and Additional Recommendations:   Limitations on Scope of Treatment: Full Scope Treatment   Full code- encouraged patient to consider DNR/DNI starus understanding poor outcomes in similar patietns  Code Status:    Code Status Orders        Start     Ordered   02/21/17 1350  Full code  Continuous     02/21/17 1350    Code Status History    Date Active Date Inactive Code Status Order ID Comments User Context   12/03/2016  2:02 PM 12/07/2016  5:06 PM Full Code [id number]  Lytle Butte, MD ED   10/13/2016  5:47 AM 10/21/2016  9:14 PM Full Code [id number]  Harrie Foreman, MD ED   08/30/2016 11:58 AM 09/02/2016  7:47 PM Full Code [id number]  Hessie Knows, MD Inpatient   08/15/2016  3:18 PM 08/18/2016  8:03 PM Full Code [id number]  Hessie Knows, MD Inpatient   07/30/2016  4:27 PM 08/01/2016  7:55 PM Full Code [id number]  Hessie Knows, MD ED   12/14/2015  3:03 PM 12/15/2015  6:03 PM Full Code [id number]  Gladstone Lighter, MD Inpatient       Prognosis:   Unable to determine  Discharge Planning:  [place] for rehab with Palliative care service follow-up  Care plan was discussed with Discussed with Dr Tressia Miners  Thank you for allowing the Palliative Medicine Team to assist in the care of this patient.   Time In: 0730 Time Out: 0805 Total Time 35 min Prolonged Time Billed  no       Greater than 50%  of this time was spent  counseling and coordinating care related to the above assessment and plan.  Wadie Lessen, NP  Please contact Palliative Medicine Team phone at [phone] for questions and concerns.

## 2017-02-25 NOTE — Progress Notes (Signed)
Patient has not voided since foley was removed at 0655. MD notified. Order received to bladder scan patient. If scan is less than 200 continue to wait.

## 2017-02-25 NOTE — Progress Notes (Signed)
[facility] [place] NAME: Kurt Hill    MR#:  [id number]  DATE OF BIRTH:  25-Dec-1931  SUBJECTIVE:  CHIEF COMPLAINT:  No chief complaint on file.  - BP remains low, but asymptomatic now - palliative care consult pending - anemic, receiving 1 unit tx today  REVIEW OF SYSTEMS:  Review of Systems  Constitutional: Positive for malaise/fatigue. Negative for chills and fever.  HENT: Negative for ear discharge, ear pain, hearing loss and nosebleeds.   Eyes: Negative for blurred vision.  Respiratory: Negative for cough, shortness of breath and wheezing.   Cardiovascular: Negative for chest pain, palpitations and leg swelling.  Gastrointestinal: Negative for abdominal pain, constipation, diarrhea, nausea and vomiting.  Genitourinary: Negative for dysuria.  Musculoskeletal: Positive for myalgias.  Neurological: Negative for dizziness, sensory change, speech change, focal weakness, seizures and headaches.    DRUG ALLERGIES:   Allergies  Allergen Reactions  . Ivp Dye [Iodinated Diagnostic Agents] Itching and Rash  . Sulfa Antibiotics Rash    VITALS:  Blood pressure (!) 93/54, pulse 81, temperature 98.3 F (36.8 C), temperature source Oral, resp. rate 18, height _0  (1.778 m), weight 56.8 kg (125 lb 4.8 oz), SpO2 98 %.  PHYSICAL EXAMINATION:  Physical Exam  GENERAL:  83 y.o.-year-old patient lying in the bed with no acute distress. Appears chronically ill EYES: Pupils equal, round, reactive to light and accommodation. No scleral icterus. Extraocular muscles intact.  HEENT: Head atraumatic, normocephalic. Oropharynx and nasopharynx clear.  NECK:  Supple, no jugular venous distention. No thyroid enlargement, no tenderness.  LUNGS: Normal breath sounds bilaterally, no wheezing, rales,rhonchi or crepitation. No use of accessory muscles of respiration. Decreased bibasilar breath sounds CARDIOVASCULAR: S1, S2 normal. No rubs, or gallops.  2/6 systolic murmur present ABDOMEN: Soft, nontender, nondistended. Bowel sounds present. No organomegaly or mass.  EXTREMITIES: left lower extremities with flexion contraction, right leg is pale and has 2+ edema.  No cyanosis, or clubbing.  NEUROLOGIC: Cranial nerves II through XII are intact. Muscle strength 4/5 in both upper extremities and 2/5 of lower extremities, . Sensation intact. Gait not checked.  PSYCHIATRIC: The patient is alert and oriented x 3.  SKIN: No obvious rash, lesion, or ulcer.    LABORATORY PANEL:   CBC  Recent Labs Lab 02/25/17 0750  WBC 6.6  HGB 7.9*  HCT 23.0*  PLT 549*   ------------------------------------------------------------------------------------------------------------------  Chemistries   Recent Labs Lab 02/21/17 1459  02/24/17 0338  NA 132*  < > 137  K 4.3  < > 3.8  CL 101  < > 105  CO2 25  < > 26  GLUCOSE 103*  < > 90  BUN 25*  < > 19  CREATININE 0.57*  < > 0.44*  CALCIUM 8.7*  < > 8.3*  AST 27  --   --   ALT 21  --   --   ALKPHOS 66  --   --   BILITOT 0.4  --   --   < > = values in this interval not displayed. ------------------------------------------------------------------------------------------------------------------  Cardiac Enzymes No results for input(s): TROPONINI in the last 168 hours. ------------------------------------------------------------------------------------------------------------------  RADIOLOGY:  Dg Abd 1 View  Result Date: 02/24/2017 CLINICAL DATA:  Abdominal pain with concern for ureteral calculus EXAM: ABDOMEN - 1 VIEW COMPARISON:  February 23, 2017 FINDINGS: The there is mild colonic dilatation. There is moderate contrast in the colon. Small bowel is not dilated. There are calcifications in the pelvis,  likely phleboliths. No other abnormal calcifications are evident. There is anterior wedging of the L1 vertebral body, stable. IMPRESSION: Question a degree of colonic ileus. No ureteral calculi evident  by radiography. Probable phleboliths in the pelvis. Stable anterior wedging L1 vertebral body. Electronically Signed   By: Lowella Grip III M.D.   On: 02/24/2017 09:59    EKG:   Orders placed or performed during the hospital encounter of 12/03/16  . EKG 12-Lead  . EKG 12-Lead  . EKG 12-Lead  . EKG 12-Lead  . EKG  . EKG    ASSESSMENT AND PLAN:   Kurt Hill  is a 83 y.o. male with a known history of asthma/COPD not on home o2, BPH, CAD, chronic hypotension, hypothyroidism from [facility] rehab sent in from PCP office due to weakness, weight loss and hypotension.  #1 Acute cystitis- urine cultures Are negative. Wound cultures from his left heel ulcer growing some gram positives. -Change IV Rocephin to oral Ceftin - CT abd with left ureteral stone and left hydronephrosis- urology consult appreciated. -Foley had to be reinserted this Am due to urinary retention.Marland Kitchen KUB with no ureteral stones seen. Outpatient CT again. -- relieve constipation. - generalized deconditioning. wound cultures from left heel unstageable pressure ulcer as well - Dietitian consult - Physical therapy- used to ambulate with a walker before- discussed with Ortho Dr. Rudene Christians- he said that left leg can be weight bearing as tolerated, no brace/immobiliser or spacer recommended at this time - continue megace.  #2 Hypotension- improving with fluids. Baseline low BP - continue midodrine and florinef-Increased Florinef dosage  #3 Anemia- anemia of chronic disease. Baseline around 9.5 to 10 - no active bleeding. Patient recollects that his last colonoscopy was normal was almost 5 years ago - anemia labs Within normal limits.  - hb dropped to 7.9- 1 unit Tx as per oncology - Appreciate hematology consult. No indication for bone marrow biopsy at this time. No other procedures  #4 Hypothyroidism- on synthroid, elevated TSH, normal free T4  #5 COPD/ asthma- stable, continue inhalers  #6 DVT Prophylaxis-  lovenox- hold with anemia   Patient is from [facility] care consulted   All the records are reviewed and case discussed with Care Management/Social Workerr. Management plans discussed with the patient, family and they are in agreement.  CODE STATUS: Full Code  TOTAL TIME TAKING CARE OF THIS PATIENT: 37 minutes.   POSSIBLE D/C TOMORROW, DEPENDING ON CLINICAL CONDITION.   Montague Corella M.D on 02/25/2017 at 1:12 PM  Between 7am to 6pm - Pager - [phone]  After 6pm go to [email or web address] - password [email or web address] [facility]  Office  [phone]  CC: Primary care physician; Tracie Harrier, MD

## 2017-02-25 NOTE — Consult Note (Signed)
Pharmacy Antibiotic Note  Kurt Hill is a 83 y.o. male admitted on 02/21/2017 with wound infection- growing MRSA.  Pharmacy has been consulted for vancomycin dosing. Wound growing few MRSA- sensitive to bactrim but pt has sulfa allergy (rash) R to cipro, clinda, erythro,tetracyclyine  Plan: Vancomycin 1 g once. Then 8 hours later for stacked dosing start 500mg  q 12 hours. Will draw trough prior to the 5th dose.  Height: 5\' 10"  (177.8 cm) Weight: 125 lb 4.8 oz (56.8 kg) IBW/kg (Calculated) : 73  Temp (24hrs), Avg:98 F (36.7 C), Min:97.6 F (36.4 C), Max:98.5 F (36.9 C)   Recent Labs Lab 02/21/17 1459 02/22/17 0420 02/24/17 0338 02/25/17 0750  WBC 8.9 6.5 7.1 6.6  CREATININE 0.57* 0.59* 0.44*  --     Estimated Creatinine Clearance: 57.2 mL/min (A) (by C-G formula based on SCr of 0.44 mg/dL (L)).    Allergies  Allergen Reactions  . Ivp Dye [Iodinated Diagnostic Agents] Itching and Rash  . Sulfa Antibiotics Rash    Antimicrobials this admission: Ceftriaxone 3/16>> 3/19 Cefuroxime 3/19>>3/20 Vancomycin 3/20>>  Dose adjustments this admission:   Microbiology results: Recent Results (from the past 240 hour(s))  MRSA PCR Screening     Status: Abnormal   Collection Time: 02/21/17  3:48 PM  Result Value Ref Range Status   MRSA by PCR POSITIVE (A) NEGATIVE Final    Comment:        The GeneXpert MRSA Assay (FDA approved for NASAL specimens only), is one component of a comprehensive MRSA colonization surveillance program. It is not intended to diagnose MRSA infection nor to guide or monitor treatment for MRSA infections. RESULT CALLED TO, READ BACK BY AND VERIFIED WITH: Yves Dill RN AT 0938 02/21/17 MSS.   Aerobic/Anaerobic Culture (surgical/deep wound)     Status: None (Preliminary result)   Collection Time: 02/21/17  7:50 PM  Result Value Ref Range Status   Specimen Description ULCER  Final   Special Requests NONE  Final   Gram Stain   Final    NO WBC  SEEN FEW GRAM POSITIVE RODS RARE GRAM POSITIVE COCCI IN PAIRS FEW SQUAMOUS EPITHELIAL CELLS PRESENT Performed at [facility], [place] [address] [place], [place] [address]    Culture   Final    FEW METHICILLIN RESISTANT STAPHYLOCOCCUS AUREUS NO ANAEROBES ISOLATED; CULTURE IN PROGRESS FOR 5 DAYS    Report Status PENDING  Incomplete   Organism ID, Bacteria METHICILLIN RESISTANT STAPHYLOCOCCUS AUREUS  Final      Susceptibility   Methicillin resistant staphylococcus aureus - MIC*    CIPROFLOXACIN >=8 RESISTANT Resistant     ERYTHROMYCIN >=8 RESISTANT Resistant     GENTAMICIN <=0.5 SENSITIVE Sensitive     OXACILLIN >=4 RESISTANT Resistant     TETRACYCLINE >=16 RESISTANT Resistant     VANCOMYCIN 1 SENSITIVE Sensitive     TRIMETH/SULFA <=10 SENSITIVE Sensitive     CLINDAMYCIN >=8 RESISTANT Resistant     RIFAMPIN <=0.5 SENSITIVE Sensitive     Inducible Clindamycin NEGATIVE Sensitive     * FEW METHICILLIN RESISTANT STAPHYLOCOCCUS AUREUS  Urine culture     Status: None   Collection Time: 02/21/17 10:45 PM  Result Value Ref Range Status   Specimen Description URINE, RANDOM  Final   Special Requests NONE  Final   Culture   Final    NO GROWTH Performed at [facility], [place] [address], [place], [place] [address]    Report Status 02/23/2017 FINAL  Final  Thank you for allowing pharmacy to be a part of this patient's care.  Ramond Dial, Pharm.D, BCPS Clinical Pharmacist  02/25/2017 8:10 PM

## 2017-02-25 NOTE — Progress Notes (Signed)
Per RN in progression rounds patient will likely D/C tomorrow with a foley. Plan is for patient to D/C back to [facility]. Clinical Education officer, museum (CSW) sent [employer] at [facility] a message making him aware of above. CSW will continue to follow and assist as needed.   [employer], LCSW [phone]

## 2017-02-25 NOTE — Progress Notes (Signed)
Patient is A&O x4, forgetful at times. Repositioned Q2H, as patient allows. Fair diet, needs total assist. Urine rentention noted, placed foley cath. Cuede needed. Many skin concerns. Prevlon boot in place to left foot. Receiving one unit of PRBC for a HGB of 7.9. Isolation in place. Large BM this shift. Bed alarm on for safety. No c/o pain this shift.

## 2017-02-25 NOTE — Telephone Encounter (Signed)
Done patient is still in the hospital still need to give the patient the appt  [facility]

## 2017-02-25 NOTE — Progress Notes (Signed)
OT Cancellation Note  Patient Details Name: Kurt Hill MRN: [id number] DOB: 09-01-1932   Cancelled Treatment:    Reason Eval/Treat Not Completed: Medical issues which prohibited therapy. Order received, chart reviewed. Pt currently with Hgb 7.9, HCT 23, pending blood transfusion and palliative care consult. Will hold OT at this time. Will continue to monitor and re-attempt OT evaluation at later date/time as appropriate to assess self feeding skills per OT consult.  Jeni Salles, MPH, MS, OTR/L ascom [phone] 02/25/17, 2:50 PM

## 2017-02-26 LAB — TYPE AND SCREEN
ABO/RH(D): O POS
Antibody Screen: NEGATIVE
Unit division: 0

## 2017-02-26 LAB — CBC
HEMATOCRIT: 27.8 % — AB (ref 40.0–52.0)
HEMOGLOBIN: 9.5 g/dL — AB (ref 13.0–18.0)
MCH: 30.1 pg (ref 26.0–34.0)
MCHC: 34 g/dL (ref 32.0–36.0)
MCV: 88.5 fL (ref 80.0–100.0)
PLATELETS: 529 10*3/uL — AB (ref 150–440)
RBC: 3.14 MIL/uL — AB (ref 4.40–5.90)
RDW: 18.7 % — ABNORMAL HIGH (ref 11.5–14.5)
WBC: 6.6 10*3/uL (ref 3.8–10.6)

## 2017-02-26 LAB — BASIC METABOLIC PANEL
ANION GAP: 4 — AB (ref 5–15)
BUN: 17 mg/dL (ref 6–20)
CALCIUM: 8 mg/dL — AB (ref 8.9–10.3)
CO2: 27 mmol/L (ref 22–32)
Chloride: 105 mmol/L (ref 101–111)
Creatinine, Ser: 0.6 mg/dL — ABNORMAL LOW (ref 0.61–1.24)
GFR calc Af Amer: >60 mL/min (ref >60)
GLUCOSE: 82 mg/dL (ref 65–99)
Potassium: 3.7 mmol/L (ref 3.5–5.1)
Sodium: 136 mmol/L (ref 135–145)

## 2017-02-26 LAB — MULTIPLE MYELOMA PANEL, SERUM
ALBUMIN SERPL ELPH-MCNC: 2.5 g/dL — AB (ref 2.9–4.4)
ALPHA 1: 0.3 g/dL (ref 0.0–0.4)
Albumin/Glob SerPl: 0.9 (ref 0.7–1.7)
Alpha2 Glob SerPl Elph-Mcnc: 0.8 g/dL (ref 0.4–1.0)
B-Globulin SerPl Elph-Mcnc: 1.1 g/dL (ref 0.7–1.3)
GLOBULIN, TOTAL: 3.1 g/dL (ref 2.2–3.9)
Gamma Glob SerPl Elph-Mcnc: 1 g/dL (ref 0.4–1.8)
IGA: 423 mg/dL (ref 61–437)
IgG (Immunoglobin G), Serum: 1129 mg/dL (ref 700–1600)
IgM, Serum: 72 mg/dL (ref 15–143)
Total Protein ELP: 5.6 g/dL — ABNORMAL LOW (ref 6.0–8.5)

## 2017-02-26 LAB — BPAM RBC
Blood Product Expiration Date: 201803252359
ISSUE DATE / TIME: 201803201623
Unit Type and Rh: 5100

## 2017-02-26 MED ORDER — LINEZOLID 600 MG PO TABS: 600.0000 mg | ORAL_TABLET | Freq: Two times a day (BID) | ORAL | 0 refills | Status: DC

## 2017-02-26 MED ORDER — MIDODRINE HCL 5 MG PO TABS: 10.0000 mg | ORAL_TABLET | Freq: Three times a day (TID) | ORAL | 2 refills | Status: AC

## 2017-02-26 MED ORDER — OXYCODONE HCL 5 MG PO TABS: 5.0000 mg | ORAL_TABLET | ORAL | 0 refills | Status: AC | PRN

## 2017-02-26 MED ORDER — FLUDROCORTISONE ACETATE 0.1 MG PO TABS: 0.2000 mg | ORAL_TABLET | Freq: Every day | ORAL | 2 refills | Status: DC

## 2017-02-26 MED ORDER — METOCLOPRAMIDE HCL 5 MG PO TABS: 5.0000 mg | ORAL_TABLET | Freq: Three times a day (TID) | ORAL | 0 refills | Status: AC

## 2017-02-26 MED ORDER — COLLAGENASE 250 UNIT/GM EX OINT: TOPICAL_OINTMENT | Freq: Every day | CUTANEOUS | 0 refills | Status: DC

## 2017-02-26 NOTE — Progress Notes (Signed)
Patient is medically stable for D/C back to [facility] today. Per [facility] admissions coordinator at [facility] patient can return today to room 36-A. RN will call report and arrange EMS for transport. Clinical Education officer, museum (CSW) sent D/C orders to [employer] via [employer]. Patient is aware of above. Please reconsult if future social work needs arise. CSW signing off.   [employer], LCSW [phone]

## 2017-02-26 NOTE — Evaluation (Signed)
Occupational Therapy Evaluation Patient Details Name: Kurt Hill MRN: [id number] DOB: [birth year] Today's Date: 02/26/2017    History of Present Illness Pt is a 83 y.o. male with ORIF surgeries of left patella including 07/30/16, 08/15/16, and 10/14/16.  New ileus, L kidney stone, UTI, L1 compression fracture, bronchiolitis.  SNF resident.  Referred to OT for eval of his self feeding skills. PMHx:  hip OA, L TKA, cellulits, malnutrition   Clinical Impression   Pt seen for OT evaluation of self feeding skills this date. Pt agreeable with encouragement. Pt was receiving total assist for self feeding at SNF he came from but stated that this is a change from his baseline but it's unclear the timeframe for assistance needed. Pt demonstrates deficits in BUE strength, ROM, pain in shoulders bilaterally with ROM at/past 90 degrees, pain in R elbow with ROM at/past approx 135 degrees (75% of ROM), impaired fine and gross motor coordination, and decreased cervical/neck ROM all of which contribute to the pt's decreased ability to self feed. Pt educated in various AE for self feeding and pt reports having trialed built up handles of various sizes before without success. OT discussed the benefits of a universal cuff for self feeding and pt reports not having trialed this but stated that he did not feel like it would help, stating, "I'm open to trying something else as long as it doesn't set me back any." When asked to elaborate, pt stated that he has been losing weight/strength because he was unable to eat many of his meals due to the texture (not pureed) and the extensive time it took him to self feed without assistance, which caused a lot of frustration. OT encouraged pt to trial use of AE for self feeding outside of his normal meal times to reduce the time pressure and frustration as well as to ensure he is still able to eat his full meals with assist from staff in a timely manner to meet his caloric demands.  Recommending STR to address noted impairments and functional deficits in self feeding skills, including trial use of U-cuff with long handled utensils for self feeding due to noted impairments in addition to alternative AE and compensatory strategies to support functional independence in self feeding skills.     Follow Up Recommendations  SNF    Equipment Recommendations  Other (comment) (defer to next venue for AE for self feeding)    Recommendations for Other Services       Precautions / Restrictions Precautions Precautions: Fall Precaution Comments: telemetry Restrictions Weight Bearing Restrictions: No      Mobility Bed Mobility                  Transfers                      Balance                                            ADL Overall ADL's : Needs assistance/impaired                                       General ADL Comments: Needs max-total assist for all ADL tasks     Vision Baseline Vision/History:  (difficult to determine) Patient Visual Report: No change  from baseline Additional Comments: limited cervical/neck ROM which impacts ability to turn head to see     Perception     Praxis      Pertinent Vitals/Pain Pain Assessment: 0-10 Pain Score: 8  Pain Location: L ankle/leg Pain Intervention(s): Premedicated before session;Monitored during session     Hand Dominance Right   Extremity/Trunk Assessment Upper Extremity Assessment Upper Extremity Assessment: RUE deficits/detail;LUE deficits/detail RUE Deficits / Details: approx 50% (0-90 degrees) of normal shoulder flexion ROM without pain, approx 75% of normal elbow flexion ROM without pain, 3/5 grip strength, decreased fine motor, unable to hold small or large utensils for self feeding with dominant hand RUE Coordination: decreased fine motor;decreased gross motor LUE Deficits / Details: approx 50% (0-90 degrees) of normal shoulder flexion ROM without  pain, approx 75% of normal elbow flexion ROM without pain, 3/5 grip strength, decreased fine motor LUE Coordination: decreased fine motor;decreased gross motor   Lower Extremity Assessment Lower Extremity Assessment: Defer to PT evaluation;LLE deficits/detail   Cervical / Trunk Assessment Cervical / Trunk Assessment: Kyphotic   Communication Communication Communication: No difficulties;Other (comment) (no teeth/dentures, but able to understand pt)   Cognition Arousal/Alertness: Awake/alert Behavior During Therapy: Anxious (easily frustrated) Overall Cognitive Status: No family/caregiver present to determine baseline cognitive functioning                     General Comments       Exercises   Other Exercises Other Exercises: Pt educated in adaptive equipment to support self feeding skills including built up handles, universal cuff, long handled utensils   Shoulder Instructions      Home Living Family/patient expects to be discharged to:: Skilled nursing facility                                        Prior Functioning/Environment Level of Independence: Needs assistance  Gait / Transfers Assistance Needed: non ambulatory for last several months ADL's / Homemaking Assistance Needed: SNF resident with help for all care            OT Problem List:        OT Treatment/Interventions:      OT Goals(Current goals can be found in the care plan section)    OT Frequency:     Barriers to D/C:            Co-evaluation              End of Session    Activity Tolerance: Other (comment) (session limited by pt frustration) Patient left: in bed;with call bell/phone within reach;with bed alarm set  OT Visit Diagnosis: Feeding difficulties (R63.3);Pain;Muscle weakness (generalized) (M62.81) Pain - Right/Left: Left Pain - part of body: Leg                ADL either performed or assessed with clinical judgement  Time: 0160-1093 OT Time  Calculation (min): 23 min Charges:  OT General Charges $OT Visit: 1 Procedure OT Evaluation $OT Eval Moderate Complexity: 1 Procedure OT Treatments $Self Care/Home Management : 8-22 mins G-Codes:     Jeni Salles, MPH, MS, OTR/L ascom [phone] 02/26/17, 9:34 AM

## 2017-02-26 NOTE — Progress Notes (Signed)
Shift assessment completed at 0740. Pt is awake, alert and oriented, in no distress, flat affect. Pt is on room air, lungs clear bilat, Hr is regular, abdomen is soft, bs heard. Pt has foley draining yellow urine,Rppp, L foot is in blue boot with gausze dressing dry, old drainage noted, intact. PIV #20 intact to l forearm, site is free of redness and swelling. L leg is bent at the knee, pt states he is unable to straighten his leg, unable to bear weight on either leg. Since admission, this writer changed dressing to l foot. Pt with a small collapsed blister area to top of L foot that is cleaned with saline and gauze applied. L heel wound had wound bed that is pink, black around the edges of the wound, is cleansed with santyl applied. Pt tolerated fairly well, c/o pain to l foot after dressing change is finished, pt received pain med at that time.Pt has made adequate progress for discharge. This Probation officer gave report to [facility] at [facility] at approx 1300. Pt was dc'd from the facility via ems at 1625. PIV removed at approx 1250 with catheter intact, pt tolerated well.

## 2017-02-26 NOTE — Discharge Summary (Signed)
[facility] NAME: Kurt Hill    MR#:  [id number]  DATE OF BIRTH:  1932/06/21  DATE OF ADMISSION:  02/21/2017   ADMITTING PHYSICIAN: Gladstone Lighter, MD  DATE OF DISCHARGE: 02/26/2017  PRIMARY CARE PHYSICIAN: Tracie Harrier, MD   ADMISSION DIAGNOSIS:   hypotension, emaciated, malnutrition, left heel ulcer  DISCHARGE DIAGNOSIS:   Active Problems:   Anemia   DNR (do not resuscitate) discussion   Palliative care by specialist   Adult failure to thrive   Weakness generalized   SECONDARY DIAGNOSIS:   Past Medical History:  Diagnosis Date  . Anemia   . Asthma   . BPH (benign prostatic hyperplasia)   . COPD (chronic obstructive pulmonary disease) (HCC)    not on home oxygen  . Coronary artery disease   . Hypotension   . Hypothyroidism   . Skin abnormalities   . Skin cancer     HOSPITAL COURSE:   Kurt Hill a 83 y.o. malewith a known history of asthma/COPD not on home o2, BPH, CAD, chronic hypotension, hypothyroidism from [facility] rehab sent in from PCP office due to weakness, weight loss and hypotension.  #1 Acute cystitis and also left heel decubitus ulcer- - urine cultures Are negative. Wound cultures from his left heel ulcer growing MRSA. -was on IV Rocephin changed to oral Ceftin- finished. On IV vancomycin for wound cultures- changed to zyvox at discharge  #2 Acute urinary retention with left hydronephrosis:  - CT abd with left ureteral stone and left hydronephrosis- urology consult appreciated. -Foley had to be reinserted due to urinary retention.. Repeat  KUB with no ureteral stones seen. Outpatient CT again. -Meds to relieve constipation. - continue flomax  #3  generalized deconditioning. To rehab - Dietitian consult - Physical therapy- used to ambulate with a walker before- discussed with Ortho Dr. Rudene Christians- he said that left leg can be weight bearing as tolerated, no  brace/immobiliser or spacer recommended at this time - continue megace.  #4 Hypotension- chronic orthostatic hypotension. improved with fluids. Baseline low BP - continue midodrine and florinef-Increased Florinef dosage  #5 Anemia- anemia of chronic disease. Baseline around 9.5 to 10 - no active bleeding. Patient recollects that his last colonoscopy was normal was almost 5 years ago - anemia labs Within normal limits.  - hb dropped to 7.9- 1 unit Tx as per oncology- hb >9 after Tx - Appreciate hematology consult. No indication for bone marrow biopsy at this time. No other procedures  #6 Hypothyroidism- on synthroid, elevated TSH, normal free T4  #7 COPD/ asthma- stable, continue inhalers  Will be discharged back to rehab today Palliative care consulted here in the hospital for goals of care due to chronic debility and overall long term poor prognosis- to f/u with palliative care services at rehab   DISCHARGE CONDITIONS:   Guarded  CONSULTS OBTAINED:   Treatment Team:  Ardis Hughs, MD Cammie Sickle, MD  DRUG ALLERGIES:   Allergies  Allergen Reactions  . Ivp Dye [Iodinated Diagnostic Agents] Itching and Rash  . Sulfa Antibiotics Rash   DISCHARGE MEDICATIONS:   Allergies as of 02/26/2017      Reactions   Ivp Dye [iodinated Diagnostic Agents] Itching, Rash   Sulfa Antibiotics Rash      Medication List    TAKE these medications   acetaminophen 325 MG tablet Commonly known as:  TYLENOL Take by mouth every 4 (four) hours as needed.   albuterol (  2.5 MG/3ML) 0.083% nebulizer solution Commonly known as:  PROVENTIL Take 2.5 mg by nebulization every 6 (six) hours as needed for wheezing or shortness of breath. What changed:  Another medication with the same name was removed. Continue taking this medication, and follow the directions you see here.   aspirin EC 81 MG tablet Take 81 mg by mouth every morning.   bisacodyl 5 MG EC tablet Commonly known as:   DULCOLAX Take 1 tablet (5 mg total) by mouth daily as needed for moderate constipation.   collagenase ointment Commonly known as:  SANTYL Apply topically daily.   feeding supplement (ENSURE ENLIVE) Liqd Take 237 mLs by mouth 2 (two) times daily between meals.   finasteride 5 MG tablet Commonly known as:  PROSCAR Take 5 mg by mouth every morning.   fludrocortisone 0.1 MG tablet Commonly known as:  FLORINEF Take 2 tablets (0.2 mg total) by mouth daily. What changed:  how much to take   lactulose 10 GM/15ML solution Commonly known as:  CHRONULAC Take 45 mLs (30 g total) by mouth daily as needed for moderate constipation.   levothyroxine 100 MCG tablet Commonly known as:  SYNTHROID, LEVOTHROID Take 1 tablet by mouth daily.   linezolid 600 MG tablet Commonly known as:  ZYVOX Take 1 tablet (600 mg total) by mouth 2 (two) times daily. X 7 days   megestrol 40 MG tablet Commonly known as:  MEGACE Take 40 mg by mouth daily. What changed:  Another medication with the same name was removed. Continue taking this medication, and follow the directions you see here.   metoCLOPramide 5 MG tablet Commonly known as:  REGLAN Take 1 tablet (5 mg total) by mouth 4 (four) times daily -  before meals and at bedtime. What changed:  medication strength  how much to take   midodrine 5 MG tablet Commonly known as:  PROAMATINE Take 2 tablets (10 mg total) by mouth 3 (three) times daily with meals.   MYLANTA 200-200-20 MG/5ML suspension Generic drug:  alum & mag hydroxide-simeth Take 30 mLs by mouth every 6 (six) hours as needed for indigestion or heartburn.   ondansetron 4 MG tablet Commonly known as:  ZOFRAN Take 4 mg by mouth every 8 (eight) hours as needed for nausea or vomiting.   oxyCODONE 5 MG immediate release tablet Commonly known as:  Oxy IR/ROXICODONE Take 1 tablet (5 mg total) by mouth every 4 (four) hours as needed for moderate pain or severe pain. What changed:  Another  medication with the same name was removed. Continue taking this medication, and follow the directions you see here.   polyethylene glycol packet Commonly known as:  MIRALAX / GLYCOLAX Take 17 g by mouth daily.   sennosides-docusate sodium 8.6-50 MG tablet Commonly known as:  SENOKOT-S Take 2 tablets by mouth 2 (two) times daily.   sertraline 25 MG tablet Commonly known as:  ZOLOFT Take 25 mg by mouth daily.   SYMBICORT 80-4.5 MCG/ACT inhaler Generic drug:  budesonide-formoterol Inhale 2 puffs into the lungs 2 (two) times daily.   tamsulosin 0.4 MG Caps capsule Commonly known as:  FLOMAX Take 1 capsule (0.4 mg total) by mouth daily.   Vitamin D (Ergocalciferol) 50000 units Caps capsule Commonly known as:  DRISDOL Take 1 capsule by mouth every 7 (seven) days.        DISCHARGE INSTRUCTIONS:   1. PCP follow-up in 1-2 weeks 2. Oncology follow-up in 1-2 weeks 3. Palliative care services at the rehabilitation 4.  Orthopedic follow-up in 1-2 weeks  DIET:   Cardiac diet  ACTIVITY:   Activity as tolerated  OXYGEN:   Home Oxygen: No.  Oxygen Delivery: room air  DISCHARGE LOCATION:   nursing home   If you experience worsening of your admission symptoms, develop shortness of breath, life threatening emergency, suicidal or homicidal thoughts you must seek medical attention immediately by calling 911 or calling your MD immediately  if symptoms less severe.  You Must read complete instructions/literature along with all the possible adverse reactions/side effects for all the Medicines you take and that have been prescribed to you. Take any new Medicines after you have completely understood and accpet all the possible adverse reactions/side effects.   Please note  You were cared for by a hospitalist during your hospital stay. If you have any questions about your discharge medications or the care you received while you were in the hospital after you are discharged, you can  call the unit and asked to speak with the hospitalist on call if the hospitalist that took care of you is not available. Once you are discharged, your primary care physician will handle any further medical issues. Please note that NO REFILLS for any discharge medications will be authorized once you are discharged, as it is imperative that you return to your primary care physician (or establish a relationship with a primary care physician if you do not have one) for your aftercare needs so that they can reassess your need for medications and monitor your lab values.    On the day of Discharge:  VITAL SIGNS:   Blood pressure 124/63, pulse 80, temperature 98.6 F (37 C), temperature source Oral, resp. rate 16, height _0  (1.778 m), weight 56.8 kg (125 lb 4.8 oz), SpO2 96 %.  PHYSICAL EXAMINATION:   GENERAL: 83 y.o.-year-old patient lying in the bed with no acute distress. Appears chronically ill EYES: Pupils equal, round, reactive to light and accommodation. No scleral icterus. Extraocular muscles intact.  HEENT: Head atraumatic, normocephalic. Oropharynx and nasopharynx clear.  NECK: Supple, no jugular venous distention. No thyroid enlargement, no tenderness.  LUNGS: Normal breath sounds bilaterally, no wheezing, rales,rhonchi or crepitation. No use of accessory muscles of respiration. Decreased bibasilar breath sounds CARDIOVASCULAR: S1, S2 normal. No rubs, or gallops. 2/6 systolic murmur present ABDOMEN: Soft, nontender, nondistended. Bowel sounds present. No organomegaly or mass.  EXTREMITIES: left lower extremities with flexion contraction, right leg is pale and has 2+ edema. No cyanosis, or clubbing.  NEUROLOGIC: Cranial nerves II through XII are intact. Muscle strength 4/5 in both upperextremities and 2/5 of lower extremities, . Sensation intact. Gait not checked.  PSYCHIATRIC: The patient is alert and oriented x 3.  SKIN: No obvious rash, lesion, or ulcer.   DATA REVIEW:    CBC  Recent Labs Lab 02/26/17 0625  WBC 6.6  HGB 9.5*  HCT 27.8*  PLT 529*    Chemistries   Recent Labs Lab 02/21/17 1459  02/26/17 0625  NA 132*  < > 136  K 4.3  < > 3.7  CL 101  < > 105  CO2 25  < > 27  GLUCOSE 103*  < > 82  BUN 25*  < > 17  CREATININE 0.57*  < > 0.60*  CALCIUM 8.7*  < > 8.0*  AST 27  --   --   ALT 21  --   --   ALKPHOS 66  --   --   BILITOT 0.4  --   --   < > =  values in this interval not displayed.   Microbiology Results  Results for orders placed or performed during the hospital encounter of 02/21/17  MRSA PCR Screening     Status: Abnormal   Collection Time: 02/21/17  3:48 PM  Result Value Ref Range Status   MRSA by PCR POSITIVE (A) NEGATIVE Final    Comment:        The GeneXpert MRSA Assay (FDA approved for NASAL specimens only), is one component of a comprehensive MRSA colonization surveillance program. It is not intended to diagnose MRSA infection nor to guide or monitor treatment for MRSA infections. RESULT CALLED TO, READ BACK BY AND VERIFIED WITH: Yves Dill RN AT 3086 02/21/17 MSS.   Aerobic/Anaerobic Culture (surgical/deep wound)     Status: None (Preliminary result)   Collection Time: 02/21/17  7:50 PM  Result Value Ref Range Status   Specimen Description ULCER  Final   Special Requests NONE  Final   Gram Stain   Final    NO WBC SEEN FEW GRAM POSITIVE RODS RARE GRAM POSITIVE COCCI IN PAIRS FEW SQUAMOUS EPITHELIAL CELLS PRESENT Performed at [facility], [place] [address], [place], [place] [address]    Culture   Final    FEW METHICILLIN RESISTANT STAPHYLOCOCCUS AUREUS NO ANAEROBES ISOLATED; CULTURE IN PROGRESS FOR 5 DAYS    Report Status PENDING  Incomplete   Organism ID, Bacteria METHICILLIN RESISTANT STAPHYLOCOCCUS AUREUS  Final      Susceptibility   Methicillin resistant staphylococcus aureus - MIC*    CIPROFLOXACIN >=8 RESISTANT Resistant     ERYTHROMYCIN >=8 RESISTANT Resistant     GENTAMICIN <=0.5  SENSITIVE Sensitive     OXACILLIN >=4 RESISTANT Resistant     TETRACYCLINE >=16 RESISTANT Resistant     VANCOMYCIN 1 SENSITIVE Sensitive     TRIMETH/SULFA <=10 SENSITIVE Sensitive     CLINDAMYCIN >=8 RESISTANT Resistant     RIFAMPIN <=0.5 SENSITIVE Sensitive     Inducible Clindamycin NEGATIVE Sensitive     * FEW METHICILLIN RESISTANT STAPHYLOCOCCUS AUREUS  Urine culture     Status: None   Collection Time: 02/21/17 10:45 PM  Result Value Ref Range Status   Specimen Description URINE, RANDOM  Final   Special Requests NONE  Final   Culture   Final    NO GROWTH Performed at [facility], [place] [address] [place], [place] [address]    Report Status 02/23/2017 FINAL  Final    RADIOLOGY:  No results found.   Management plans discussed with the patient, family and they are in agreement.  CODE STATUS:     Code Status Orders        Start     Ordered   02/21/17 1350  Full code  Continuous     02/21/17 1350    Code Status History    Date Active Date Inactive Code Status Order ID Comments User Context   12/03/2016  2:02 PM 12/07/2016  5:06 PM Full Code [id number]  Lytle Butte, MD ED   10/13/2016  5:47 AM 10/21/2016  9:14 PM Full Code [id number]  Harrie Foreman, MD ED   08/30/2016 11:58 AM 09/02/2016  7:47 PM Full Code [id number]  Hessie Knows, MD Inpatient   08/15/2016  3:18 PM 08/18/2016  8:03 PM Full Code [id number]  Hessie Knows, MD Inpatient   07/30/2016  4:27 PM 08/01/2016  7:55 PM Full Code [id number]  Hessie Knows, MD ED   12/14/2015  3:03 PM 12/15/2015  6:03  PM Full Code [id number]  Gladstone Lighter, MD Inpatient      TOTAL TIME TAKING CARE OF THIS PATIENT: 38 minutes.    Gladstone Lighter M.D on 02/26/2017 at 8:35 AM  Between 7am to 6pm - Pager - [phone]  After 6pm go to [email or web address] - Proofreader  [facility]  Office  [phone]  CC: Primary care physician; Tracie Harrier, MD   Note: This dictation was prepared with  Dragon dictation along with smaller phrase technology. Any transcriptional errors that result from this process are unintentional.

## 2017-02-26 NOTE — Plan of Care (Signed)
Problem: Bowel/Gastric: Goal: Will not experience complications related to bowel motility Outcome: Completed/Met Date Met: 02/26/17 Pt has met all goals for discharge.

## 2017-02-27 LAB — AEROBIC/ANAEROBIC CULTURE W GRAM STAIN (SURGICAL/DEEP WOUND): Gram Stain: NONE SEEN

## 2017-02-27 LAB — AEROBIC/ANAEROBIC CULTURE (SURGICAL/DEEP WOUND)

## 2017-03-07 ENCOUNTER — Emergency Department
Admission: EM | Admit: 2017-03-07 | Discharge: 2017-03-07 | Disposition: A | Discharge status: Home / Self Care | Payer: Medicare Other | Attending: Emergency Medicine | Admitting: Emergency Medicine

## 2017-03-07 ENCOUNTER — Emergency Department: Payer: Medicare Other

## 2017-03-07 DIAGNOSIS — J45909 Unspecified asthma, uncomplicated: Secondary | ICD-10-CM | POA: Insufficient documentation

## 2017-03-07 DIAGNOSIS — Z79899 Other long term (current) drug therapy: Secondary | ICD-10-CM | POA: Diagnosis not present

## 2017-03-07 DIAGNOSIS — I951 Orthostatic hypotension: Secondary | ICD-10-CM | POA: Diagnosis not present

## 2017-03-07 DIAGNOSIS — R51 Headache: Secondary | ICD-10-CM | POA: Insufficient documentation

## 2017-03-07 DIAGNOSIS — I2510 Atherosclerotic heart disease of native coronary artery without angina pectoris: Secondary | ICD-10-CM | POA: Diagnosis not present

## 2017-03-07 DIAGNOSIS — Z7982 Long term (current) use of aspirin: Secondary | ICD-10-CM | POA: Insufficient documentation

## 2017-03-07 DIAGNOSIS — R55 Syncope and collapse: Secondary | ICD-10-CM | POA: Diagnosis present

## 2017-03-07 DIAGNOSIS — I10 Essential (primary) hypertension: Secondary | ICD-10-CM | POA: Insufficient documentation

## 2017-03-07 DIAGNOSIS — J449 Chronic obstructive pulmonary disease, unspecified: Secondary | ICD-10-CM | POA: Diagnosis not present

## 2017-03-07 DIAGNOSIS — Z85828 Personal history of other malignant neoplasm of skin: Secondary | ICD-10-CM | POA: Insufficient documentation

## 2017-03-07 DIAGNOSIS — N400 Enlarged prostate without lower urinary tract symptoms: Secondary | ICD-10-CM | POA: Diagnosis not present

## 2017-03-07 DIAGNOSIS — E039 Hypothyroidism, unspecified: Secondary | ICD-10-CM | POA: Diagnosis not present

## 2017-03-07 DIAGNOSIS — R519 Headache, unspecified: Secondary | ICD-10-CM

## 2017-03-07 LAB — COMPREHENSIVE METABOLIC PANEL
ALT: 19 U/L (ref 17–63)
AST: 28 U/L (ref 15–41)
Albumin: 2.5 g/dL — ABNORMAL LOW (ref 3.5–5.0)
Alkaline Phosphatase: 71 U/L (ref 38–126)
Anion gap: 6 (ref 5–15)
BUN: 19 mg/dL (ref 6–20)
CHLORIDE: 101 mmol/L (ref 101–111)
CO2: 28 mmol/L (ref 22–32)
Calcium: 8.4 mg/dL — ABNORMAL LOW (ref 8.9–10.3)
Creatinine, Ser: 0.83 mg/dL (ref 0.61–1.24)
GFR calc Af Amer: >60 mL/min (ref >60)
Glucose, Bld: 114 mg/dL — ABNORMAL HIGH (ref 65–99)
POTASSIUM: 4 mmol/L (ref 3.5–5.1)
Sodium: 135 mmol/L (ref 135–145)
Total Bilirubin: 0.4 mg/dL (ref 0.3–1.2)
Total Protein: 6.7 g/dL (ref 6.5–8.1)

## 2017-03-07 LAB — CBC WITH DIFFERENTIAL/PLATELET
BASOS ABS: 0.1 10*3/uL (ref 0–0.1)
BASOS PCT: 1 %
EOS ABS: 1.2 10*3/uL — AB (ref 0–0.7)
EOS PCT: 17 %
HCT: 34 % — ABNORMAL LOW (ref 40.0–52.0)
Hemoglobin: 11.6 g/dL — ABNORMAL LOW (ref 13.0–18.0)
LYMPHS PCT: 19 %
Lymphs Abs: 1.4 10*3/uL (ref 1.0–3.6)
MCH: 31.6 pg (ref 26.0–34.0)
MCHC: 34.2 g/dL (ref 32.0–36.0)
MCV: 92.2 fL (ref 80.0–100.0)
MONO ABS: 0.4 10*3/uL (ref 0.2–1.0)
Monocytes Relative: 6 %
Neutro Abs: 4 10*3/uL (ref 1.4–6.5)
Neutrophils Relative %: 57 %
Platelets: 356 10*3/uL (ref 150–440)
RBC: 3.68 MIL/uL — ABNORMAL LOW (ref 4.40–5.90)
RDW: 17.9 % — AB (ref 11.5–14.5)
WBC: 7 10*3/uL (ref 3.8–10.6)

## 2017-03-07 LAB — TROPONIN I: Troponin I: <0.03 ng/mL (ref <0.03)

## 2017-03-07 MED ORDER — ACETAMINOPHEN 325 MG PO TABS
650.0000 mg | ORAL_TABLET | Freq: Once | ORAL | Status: AC
  Administered 2017-03-07: 650 mg via ORAL
  Filled 2017-03-07: qty 2

## 2017-03-07 MED ORDER — SODIUM CHLORIDE 0.9 % IV BOLUS (SEPSIS)
1000.0000 mL | Freq: Once | INTRAVENOUS | Status: AC
  Administered 2017-03-07: 1000 mL via INTRAVENOUS

## 2017-03-07 NOTE — ED Notes (Signed)
Pt arrived via ems from [facility] was called unresponsiveness and hypotension - pt son states that pt was always alert and oriented and that pt just stated he felt like he might "black out" - pt is alert and oriented now and able to respond to all questions - pt son states that he had been hypotensive for some time now and that this is nothing out of the normal for him

## 2017-03-07 NOTE — ED Provider Notes (Signed)
[facility] Emergency Department Provider Note  Time seen: 3:28 PM  I have reviewed the triage vital signs and the nursing notes.   HISTORY  Chief Complaint Hypotension and Headache    HPI Kurt Hill is a 83 y.o. male with a past medical history of anemia, asthma, BPH, COPD, hypertension, skin ulcers, failure to thrive, currently lives at [facility] who presents the emergency department for headache and near syncope. According to the patient and family he was going to a urology appointment today after checking in the patient states his headache progressively worsened and became severe. Patient states when his headache and severe he felt like he was going to pass out. They noted the patient had a low blood pressure and called EMS to bring him to the emergency department.Family states the patient has a history of low blood pressure any time he sits up or stands up and this is fairly chronic for the patient. Patient states his headache is currently a 2/10 instead of a 10/10 as it was earlier.  The patients only complaint in the ER is that he wants his catheter removed as they were going to do this in the urology office today, but he left before it could occur.   Past Medical History:  Diagnosis Date  . Anemia   . Asthma   . BPH (benign prostatic hyperplasia)   . COPD (chronic obstructive pulmonary disease) (HCC)    not on home oxygen  . Coronary artery disease   . Hypotension   . Hypothyroidism   . Skin abnormalities   . Skin cancer     Patient Active Problem List   Diagnosis Date Noted  . DNR (do not resuscitate) discussion   . Palliative care by specialist   . Adult failure to thrive   . Weakness generalized   . Anemia 02/21/2017  . Protein-calorie malnutrition, severe 12/04/2016  . Pressure injury of skin 10/13/2016  . Cellulitis of left leg 08/30/2016  . Post op infection 08/30/2016  . Cellulitis 08/30/2016  . Left patella fracture  08/15/2016  . Patella fracture 07/30/2016  . COPD exacerbation ([place]) 12/14/2015    Past Surgical History:  Procedure Laterality Date  . APPENDECTOMY    . BONE RESECTION     on nose  . MOHS SURGERY    . ORIF PATELLA Left 07/30/2016   Procedure: OPEN REDUCTION INTERNAL (ORIF) FIXATION PATELLA;  Surgeon: Hessie Knows, MD;  Location: [facility] ORS;  Service: Orthopedics;  Laterality: Left;  . ORIF PATELLA Left 08/15/2016   Procedure: OPEN REDUCTION INTERNAL (ORIF) FIXATION PATELLA;  Surgeon: Hessie Knows, MD;  Location: [facility] ORS;  Service: Orthopedics;  Laterality: Left;  . ORIF PATELLA Left 10/14/2016   Procedure: OPEN REDUCTION INTERNAL (ORIF) FIXATION PATELLA;  Surgeon: Hessie Knows, MD;  Location: [facility] ORS;  Service: Orthopedics;  Laterality: Left;  . TONSILLECTOMY      Prior to Admission medications   Medication Sig Start Date End Date Taking? Authorizing Provider  acetaminophen (TYLENOL) 325 MG tablet Take by mouth every 4 (four) hours as needed.   Yes Historical Provider, MD  albuterol (PROVENTIL) (2.5 MG/3ML) 0.083% nebulizer solution Take 2.5 mg by nebulization every 6 (six) hours as needed for wheezing or shortness of breath.   Yes Historical Provider, MD  alum & mag hydroxide-simeth (MYLANTA) 200-200-20 MG/5ML suspension Take 30 mLs by mouth every 6 (six) hours as needed for indigestion or heartburn.   Yes Historical Provider, MD  aspirin EC 81 MG  tablet Take 81 mg by mouth every morning.   Yes Historical Provider, MD  bisacodyl (DULCOLAX) 5 MG EC tablet Take 1 tablet (5 mg total) by mouth daily as needed for moderate constipation. 12/07/16  Yes Lytle Butte, MD  budesonide-formoterol [facility]) 80-4.5 MCG/ACT inhaler Inhale 2 puffs into the lungs 2 (two) times daily.   Yes Historical Provider, MD  collagenase (SANTYL) ointment Apply topically daily. 02/26/17  Yes Gladstone Lighter, MD  finasteride (PROSCAR) 5 MG tablet Take 5 mg by mouth every morning. 03/12/16 03/12/17 Yes Historical  Provider, MD  fludrocortisone (FLORINEF) 0.1 MG tablet Take 2 tablets (0.2 mg total) by mouth daily. 02/26/17  Yes Gladstone Lighter, MD  lactulose (CHRONULAC) 10 GM/15ML solution Take 45 mLs (30 g total) by mouth daily as needed for moderate constipation. 09/17/16  Yes Paulette Blanch, MD  levothyroxine (SYNTHROID, LEVOTHROID) 100 MCG tablet Take 1 tablet by mouth daily. 09/04/15  Yes Historical Provider, MD  megestrol (MEGACE) 40 MG tablet Take 40 mg by mouth daily.   Yes Historical Provider, MD  metoCLOPramide (REGLAN) 5 MG tablet Take 1 tablet (5 mg total) by mouth 4 (four) times daily -  before meals and at bedtime. 02/26/17  Yes Gladstone Lighter, MD  midodrine (PROAMATINE) 5 MG tablet Take 2 tablets (10 mg total) by mouth 3 (three) times daily with meals. 02/26/17  Yes Gladstone Lighter, MD  ondansetron (ZOFRAN) 4 MG tablet Take 4 mg by mouth every 8 (eight) hours as needed for nausea or vomiting.   Yes Historical Provider, MD  oxyCODONE (OXY IR/ROXICODONE) 5 MG immediate release tablet Take 1 tablet (5 mg total) by mouth every 4 (four) hours as needed for moderate pain or severe pain. 02/26/17  Yes Gladstone Lighter, MD  polyethylene glycol (MIRALAX / GLYCOLAX) packet Take 17 g by mouth daily. 10/20/16  Yes Bettey Costa, MD  sennosides-docusate sodium (SENOKOT-S) 8.6-50 MG tablet Take 2 tablets by mouth 2 (two) times daily.   Yes Historical Provider, MD  sertraline (ZOLOFT) 25 MG tablet Take 25 mg by mouth daily.   Yes Historical Provider, MD  tamsulosin (FLOMAX) 0.4 MG CAPS capsule Take 1 capsule (0.4 mg total) by mouth daily. 12/07/16  Yes Lytle Butte, MD  Vitamin D, Ergocalciferol, (DRISDOL) 50000 units CAPS capsule Take 1 capsule by mouth every 7 (seven) days. 09/04/15  Yes Historical Provider, MD  feeding supplement, ENSURE ENLIVE, (ENSURE ENLIVE) LIQD Take 237 mLs by mouth 2 (two) times daily between meals. 10/19/16   Bettey Costa, MD  linezolid (ZYVOX) 600 MG tablet Take 1 tablet (600 mg total) by  mouth 2 (two) times daily. X 7 days Patient not taking: Reported on 03/07/2017 02/26/17   Gladstone Lighter, MD    Allergies  Allergen Reactions  . Ivp Dye [Iodinated Diagnostic Agents] Itching and Rash  . Sulfa Antibiotics Rash    Family History  Problem Relation Age of Onset  . CAD Other   . Diabetes Mother     Social History Social History  Substance Use Topics  . Smoking status: Never Smoker  . Smokeless tobacco: Never Used     Comment: tried cigarettes when young- but never really smoked  . Alcohol use No    Review of Systems Constitutional: Negative for fever. Cardiovascular: Negative for chest pain. Respiratory: Negative for shortness of breath. Gastrointestinal: Negative for abdominal pain Neurological: mild headache currently, severe earlier 10-point ROS otherwise negative.  ____________________________________________   PHYSICAL EXAM:  VITAL SIGNS: ED Triage Vitals  Enc  Vitals Group     BP 03/07/17 1216 (!) 86/58     Pulse Rate 03/07/17 1216 87     Resp 03/07/17 1216 16     Temp --      Temp src --      SpO2 03/07/17 1215 97 %     Weight 03/07/17 1216 126 lb (57.2 kg)     Height 03/07/17 1216 5\' 10"  (1.778 m)     Head Circumference --      Peak Flow --      Pain Score 03/07/17 1215 10     Pain Loc --      Pain Edu? --      Excl. in [place]? --    Constitutional: Alert and oriented. Chronically ill appearing with contracted extremities, but no acute distress.  Answers questions appropriately and follows all commands. Eyes: Normal exam ENT   Head: Normocephalic and atraumatic.   Mouth/Throat: Dry mucous membranes Cardiovascular: Normal rate, regular rhythm.  Respiratory: Normal respiratory effort without tachypnea nor retractions. Breath sounds are clear Gastrointestinal: Soft and nontender. No distention. Foley catheter in place. Musculoskeletal: Contracted lower extremities with soft boots on for healing sacral ulcer.  Contracted hands  bilaterally, tenderness in all extremities with attempted ROM, but patient states this is unchanged. Neurologic:  Normal speech and language. No gross focal neurologic deficits  Psychiatric: Mood and affect are normal.  ____________________________________________    EKG  EKG reviewed by myself shows sinus rhythm at 83bpm, with narrow QRS, normal axis, normal intervals, nonspecific ST changes. No ST elevation.  ____________________________________________    RADIOLOGY  CT head shows no acute abnormality. CT renal scan shows a past left ureteral stone.  ____________________________________________   INITIAL IMPRESSION / ASSESSMENT AND PLAN / ED COURSE  Pertinent labs & imaging results that were available during my care of the patient were reviewed by me and considered in my medical decision making (see chart for details).  Patient presents to the emergency department with headache and low blood pressure.  Patient and family state the low BP is chronic and has been an ongoing issue at [employer], as it often does not allow him to participate in physical therapy sessions.  Patients states he started with a mild HA this AM which progressed throughout the day to become severe while at the urology office.  Now mild.  Denies fever, or increased weakness/numbness, or n/v.  Patients main complaint is he wants his foley catheter removed.  In reviewing the pt's records pt was to have the catheter removed prior to d/c on 02/26/17, however pt had retention issues, so the plan was to repeat a renal CT in 1-2 weeks from d/c and remove the catheter at that time.  Pt has not yet had this scan performed.  We will perform in the ED along with a ct head.  I discussed with pt and family the possibility of retention if we pull the catheter from the ER.  But pt and family are confident that [facility] would replace it if he was having retention issues.  We will likely remove in the ER and discuss  this with [facility] for close monitoring.   Labs are largely normal.  Trop negative.  normal wbc.    CT scan of the head shows no acute abnormality. CT renal scan shows a left ureteral stone previously seen is passed into the bladder. We will remove the Foley catheter in the emergency department and discussed this with [facility] to monitor the patient closely for any further retention, patient will follow-up with urology this coming week.   ____________________________________________   FINAL CLINICAL IMPRESSION(S) / ED DIAGNOSES  Foley removal Headache Hypotension    Harvest Dark, MD 03/07/17 [phone]

## 2017-03-07 NOTE — ED Notes (Signed)
Spoke with Taleah at [facility] and informed her that pt will be coming back by EMS. Informed her that we removed pts foley and they need to monitor over the next 12 to ensure pt is able to urinate and not having abdominal pain. Taleah verbalized understanding

## 2017-03-07 NOTE — Discharge Instructions (Signed)
Your Foley catheter has been removed in the emergency department. Please monitor this very closely return to the emergency department if you're not able to urinate for greater than 12 hours, or if you develop significant lower abdominal discomfort. Please drink plenty of fluids. Follow-up with your doctor in the next 2 days for recheck/reevaluation. Follow-up with urology this coming week for recheck/reevaluation.

## 2017-03-07 NOTE — ED Triage Notes (Signed)
Pt arrived via ems from [place] was called unresponsiveness and hypotension - pt son states that pt was always alert and oriented and that pt just stated he felt like he might "black out" - pt is alert and oriented now and able to respond to all questions - pt son states that he had been hypotensive for some time now and that this is nothing out of the normal for him

## 2017-03-10 ENCOUNTER — Ambulatory Visit (INDEPENDENT_AMBULATORY_CARE_PROVIDER_SITE_OTHER): Payer: Medicare Other | Admitting: Urology

## 2017-03-10 ENCOUNTER — Encounter: Payer: Self-pay | Admitting: Urology

## 2017-03-10 DIAGNOSIS — N200 Calculus of kidney: Secondary | ICD-10-CM | POA: Diagnosis not present

## 2017-03-10 NOTE — Progress Notes (Signed)
03/10/2017 8:09 AM   Kurt Hill 12-08-1932 [id number]  Referring provider: Tracie Harrier, MD [address] [facility] [place], [place] [address]   HPI:  1. Nephrolithiasis - incidetnal left 58mm distal ureteral stone by hospital CT 02/21/17 on eval failure to thrive. F/u imaging 03/07/17 with passage to bladder and resolved hydro, punctate non-obstructing renal stones remain. Cr <1. UCX negative.   PMH sig for COPD/O2, Failure to thrive, lives assisted living.  Today " Kurt Hill " is seen in f/u above. Interval imaging with progression of incidental left distal stone to likey bladder with resolved hydro. A catheter remains in place for unclear reasons as he has no history of urinary retention.    PMH: Past Medical History:  Diagnosis Date  . Anemia   . Asthma   . BPH (benign prostatic hyperplasia)   . COPD (chronic obstructive pulmonary disease) (HCC)    not on home oxygen  . Coronary artery disease   . Hypotension   . Hypothyroidism   . Skin abnormalities   . Skin cancer     Surgical History: Past Surgical History:  Procedure Laterality Date  . APPENDECTOMY    . BONE RESECTION     on nose  . MOHS SURGERY    . ORIF PATELLA Left 07/30/2016   Procedure: OPEN REDUCTION INTERNAL (ORIF) FIXATION PATELLA;  Surgeon: Hessie Knows, MD;  Location: [facility] ORS;  Service: Orthopedics;  Laterality: Left;  . ORIF PATELLA Left 08/15/2016   Procedure: OPEN REDUCTION INTERNAL (ORIF) FIXATION PATELLA;  Surgeon: Hessie Knows, MD;  Location: [facility] ORS;  Service: Orthopedics;  Laterality: Left;  . ORIF PATELLA Left 10/14/2016   Procedure: OPEN REDUCTION INTERNAL (ORIF) FIXATION PATELLA;  Surgeon: Hessie Knows, MD;  Location: [facility] ORS;  Service: Orthopedics;  Laterality: Left;  . TONSILLECTOMY      Home Medications:  Allergies as of 03/10/2017      Reactions   Ivp Dye [iodinated Diagnostic Agents] Itching, Rash   Sulfa Antibiotics Rash      Medication List       Accurate  as of 03/10/17  8:09 AM. Always use your most recent med list.          acetaminophen 325 MG tablet Commonly known as:  TYLENOL Take by mouth every 4 (four) hours as needed.   albuterol (2.5 MG/3ML) 0.083% nebulizer solution Commonly known as:  PROVENTIL Take 2.5 mg by nebulization every 6 (six) hours as needed for wheezing or shortness of breath.   aspirin EC 81 MG tablet Take 81 mg by mouth every morning.   bisacodyl 5 MG EC tablet Commonly known as:  DULCOLAX Take 1 tablet (5 mg total) by mouth daily as needed for moderate constipation.   collagenase ointment Commonly known as:  SANTYL Apply topically daily.   feeding supplement (ENSURE ENLIVE) Liqd Take 237 mLs by mouth 2 (two) times daily between meals.   finasteride 5 MG tablet Commonly known as:  PROSCAR Take 5 mg by mouth every morning.   fludrocortisone 0.1 MG tablet Commonly known as:  FLORINEF Take 2 tablets (0.2 mg total) by mouth daily.   lactulose 10 GM/15ML solution Commonly known as:  CHRONULAC Take 45 mLs (30 g total) by mouth daily as needed for moderate constipation.   levothyroxine 100 MCG tablet Commonly known as:  SYNTHROID, LEVOTHROID Take 1 tablet by mouth daily.   linezolid 600 MG tablet Commonly known as:  ZYVOX Take 1 tablet (600 mg total) by mouth 2 (two)  times daily. X 7 days   megestrol 40 MG tablet Commonly known as:  MEGACE Take 40 mg by mouth daily.   metoCLOPramide 5 MG tablet Commonly known as:  REGLAN Take 1 tablet (5 mg total) by mouth 4 (four) times daily -  before meals and at bedtime.   midodrine 5 MG tablet Commonly known as:  PROAMATINE Take 2 tablets (10 mg total) by mouth 3 (three) times daily with meals.   MYLANTA 200-200-20 MG/5ML suspension Generic drug:  alum & mag hydroxide-simeth Take 30 mLs by mouth every 6 (six) hours as needed for indigestion or heartburn.   ondansetron 4 MG tablet Commonly known as:  ZOFRAN Take 4 mg by mouth every 8 (eight) hours as  needed for nausea or vomiting.   oxyCODONE 5 MG immediate release tablet Commonly known as:  Oxy IR/ROXICODONE Take 1 tablet (5 mg total) by mouth every 4 (four) hours as needed for moderate pain or severe pain.   polyethylene glycol packet Commonly known as:  MIRALAX / GLYCOLAX Take 17 g by mouth daily.   sennosides-docusate sodium 8.6-50 MG tablet Commonly known as:  SENOKOT-S Take 2 tablets by mouth 2 (two) times daily.   sertraline 25 MG tablet Commonly known as:  ZOLOFT Take 25 mg by mouth daily.   SYMBICORT 80-4.5 MCG/ACT inhaler Generic drug:  budesonide-formoterol Inhale 2 puffs into the lungs 2 (two) times daily.   tamsulosin 0.4 MG Caps capsule Commonly known as:  FLOMAX Take 1 capsule (0.4 mg total) by mouth daily.   Vitamin D (Ergocalciferol) 50000 units Caps capsule Commonly known as:  DRISDOL Take 1 capsule by mouth every 7 (seven) days.       Allergies:  Allergies  Allergen Reactions  . Ivp Dye [Iodinated Diagnostic Agents] Itching and Rash  . Sulfa Antibiotics Rash    Family History: Family History  Problem Relation Age of Onset  . CAD Other   . Diabetes Mother     Social History:  reports that he has never smoked. He has never used smokeless tobacco. He reports that he does not drink alcohol or use drugs.   Review of Systems  Gastrointestinal (upper)  : Negative for upper GI symptoms  Gastrointestinal (lower) : Negative for lower GI symptoms  Constitutional : Negative for symptoms  Skin: Negative for skin symptoms  Eyes: Negative for eye symptoms  Ear/Nose/Throat : Negative for Ear/Nose/Throat symptoms  Hematologic/Lymphatic: Negative for Hematologic/Lymphatic symptoms  Cardiovascular : Negative for cardiovascular symptoms  Respiratory : Negative for respiratory symptoms  Endocrine: Negative for endocrine symptoms  Musculoskeletal: Negative for musculoskeletal symptoms  Neurological: Negative for neurological  symptoms  Psychologic: Negative for psychiatric symptoms  Physical Exam: There were no vitals taken for this visit.  Constitutional:  Cachectic, stigmata of chronic disease. Aox3, very pleasant.  HEENT: [place] AT, moist mucus membranes.  Trachea midline, no masses. Cardiovascular: No clubbing, cyanosis, or edema. Respiratory: Normal respiratory effort, no increased work of breathing. GI: Abdomen is soft, nontender, nondistended, no abdominal masses GU: No CVA tenderness. In situ foley with clear urine.  Skin: No rashes, bruises or suspicious lesions.Scales on LE.  Lymph: No cervical or inguinal adenopathy. Neurologic: Grossly intact, no focal deficits. LLE frozen and in [employer].  Psychiatric: Normal mood and affect.  Laboratory Data: Lab Results  Component Value Date   WBC 7.0 03/07/2017   HGB 11.6 (L) 03/07/2017   HCT 34.0 (L) 03/07/2017   MCV 92.2 03/07/2017   PLT 356 03/07/2017  Lab Results  Component Value Date   CREATININE 0.83 03/07/2017    No results found for: PSA  No results found for: TESTOSTERONE  Lab Results  Component Value Date   HGBA1C 6.0 (H) 10/13/2016    Urinalysis    Component Value Date/Time   COLORURINE YELLOW (A) 02/21/2017 2245   APPEARANCEUR CLOUDY (A) 02/21/2017 2245   APPEARANCEUR Clear 12/24/2014 1550   LABSPEC 1.020 02/21/2017 2245   LABSPEC 1.018 12/24/2014 1550   PHURINE 5.0 02/21/2017 2245   GLUCOSEU NEGATIVE 02/21/2017 2245   GLUCOSEU Negative 12/24/2014 1550   HGBUR SMALL (A) 02/21/2017 2245   BILIRUBINUR NEGATIVE 02/21/2017 2245   BILIRUBINUR Negative 12/24/2014 [place] 02/21/2017 2245   PROTEINUR NEGATIVE 02/21/2017 2245   NITRITE NEGATIVE 02/21/2017 2245   LEUKOCYTESUR LARGE (A) 02/21/2017 2245   LEUKOCYTESUR Negative 12/24/2014 1550    Pertinent Imaging: CT indepentandly reviewed x 2 as per above.   Assessment & Plan:    1. Nephrolithiasis - resolved obstructing stones by most recnet imaging. This  is fortunate as he is terrible procedural candidate. His remaining stones are non-obstructing and quite small for which I do NOT recommend therapy.   RTC Urol PRN.  Alexis Frock, [facility] [address] [place], [place] [address] [phone]

## 2017-03-12 ENCOUNTER — Inpatient Hospital Stay: Payer: Medicare Other | Admitting: Internal Medicine

## 2017-03-13 ENCOUNTER — Other Ambulatory Visit: Payer: Self-pay | Admitting: *Deleted

## 2017-03-13 ENCOUNTER — Inpatient Hospital Stay: Payer: Medicare Other | Attending: Internal Medicine | Admitting: Internal Medicine

## 2017-03-13 ENCOUNTER — Inpatient Hospital Stay: Payer: Medicare Other

## 2017-03-13 DIAGNOSIS — L03312 Cellulitis of back [any part except buttock]: Secondary | ICD-10-CM | POA: Insufficient documentation

## 2017-03-13 DIAGNOSIS — Z85828 Personal history of other malignant neoplasm of skin: Secondary | ICD-10-CM | POA: Diagnosis not present

## 2017-03-13 DIAGNOSIS — Z993 Dependence on wheelchair: Secondary | ICD-10-CM | POA: Insufficient documentation

## 2017-03-13 DIAGNOSIS — D638 Anemia in other chronic diseases classified elsewhere: Secondary | ICD-10-CM

## 2017-03-13 DIAGNOSIS — I2510 Atherosclerotic heart disease of native coronary artery without angina pectoris: Secondary | ICD-10-CM | POA: Insufficient documentation

## 2017-03-13 DIAGNOSIS — L03116 Cellulitis of left lower limb: Secondary | ICD-10-CM | POA: Diagnosis not present

## 2017-03-13 DIAGNOSIS — N400 Enlarged prostate without lower urinary tract symptoms: Secondary | ICD-10-CM | POA: Insufficient documentation

## 2017-03-13 DIAGNOSIS — Z87442 Personal history of urinary calculi: Secondary | ICD-10-CM | POA: Insufficient documentation

## 2017-03-13 DIAGNOSIS — M6250 Muscle wasting and atrophy, not elsewhere classified, unspecified site: Secondary | ICD-10-CM | POA: Diagnosis not present

## 2017-03-13 DIAGNOSIS — Z79899 Other long term (current) drug therapy: Secondary | ICD-10-CM | POA: Diagnosis not present

## 2017-03-13 DIAGNOSIS — Z7982 Long term (current) use of aspirin: Secondary | ICD-10-CM | POA: Diagnosis not present

## 2017-03-13 DIAGNOSIS — E039 Hypothyroidism, unspecified: Secondary | ICD-10-CM | POA: Diagnosis not present

## 2017-03-13 DIAGNOSIS — D500 Iron deficiency anemia secondary to blood loss (chronic): Secondary | ICD-10-CM | POA: Diagnosis present

## 2017-03-13 DIAGNOSIS — J449 Chronic obstructive pulmonary disease, unspecified: Secondary | ICD-10-CM | POA: Insufficient documentation

## 2017-03-13 LAB — CBC WITH DIFFERENTIAL/PLATELET
BASOS ABS: 0.1 10*3/uL (ref 0–0.1)
BASOS PCT: 1 %
EOS ABS: 1.2 10*3/uL — AB (ref 0–0.7)
Eosinophils Relative: 17 %
HEMATOCRIT: 30.2 % — AB (ref 40.0–52.0)
HEMOGLOBIN: 10.1 g/dL — AB (ref 13.0–18.0)
Lymphocytes Relative: 21 %
Lymphs Abs: 1.4 10*3/uL (ref 1.0–3.6)
MCH: 30.1 pg (ref 26.0–34.0)
MCHC: 33.3 g/dL (ref 32.0–36.0)
MCV: 90.4 fL (ref 80.0–100.0)
MONOS PCT: 8 %
Monocytes Absolute: 0.5 10*3/uL (ref 0.2–1.0)
NEUTROS ABS: 3.7 10*3/uL (ref 1.4–6.5)
NEUTROS PCT: 53 %
Platelets: 460 10*3/uL — ABNORMAL HIGH (ref 150–440)
RBC: 3.35 MIL/uL — AB (ref 4.40–5.90)
RDW: 17.6 % — ABNORMAL HIGH (ref 11.5–14.5)
WBC: 6.9 10*3/uL (ref 3.8–10.6)

## 2017-03-13 LAB — C-REACTIVE PROTEIN: CRP: 6.2 mg/dL — AB (ref <1.0)

## 2017-03-13 LAB — SAMPLE TO BLOOD BANK

## 2017-03-13 NOTE — Assessment & Plan Note (Addendum)
#  Iron deficiency versus anemia of chronic disease- Patient's CRP has been elevated; iron studies not classically suggestive of iron deficiency. Patient's anemia was thought to be from anemia of chronic disease- secondary to underlying infection versus other causes. No obvious blood loss noted. Given the low saturations recommend by mouth iron. Discussed the potential constipation.   # Discussed with the patient's family multiple other etiologies including bone marrow failure- including possible need for bone marrow biopsy- for further evaluation. However in the context of his poor overall health/multiple comorbidities recommend holding a bone marrow biopsy.  today hemoglobin is 10. Monitor for now.   # Recheck labs in 1 month/at NH; [facility]. Follow up in 3 months/iron studies lab.   # The above plan of care was discussed the patient/and his sister in law by the bedside. They agree.

## 2017-03-13 NOTE — Progress Notes (Signed)
[facility] OFF[facility] PROGRESS NOTE  Patient Care Team: Kurt Harrier, MD as PCP - General (Internal Medicine)  Cancer Staging No matching staging information was found for the patient.    No history exists.       INTERVAL HISTORY:  [facility] 82 y.o.  male pleasant patient With multiple comorbidities  was recently seen in the hospital- for severe anemia/hemoglobin 7.9.  Patient has multiple problems including COPD CAD; also hardware malfunction of the left knee. He also has decubitus ulcers of his lower extremities in the back.   Patient is wheelchair bound. He is at the rehabilitation. Continues to have chronic wounds of his back and also the left lower extremity/chronic cellulitis.  REVIEW OF SYSTEMS:  A complete 10 point review of system is done which is negative except mentioned above/history of present illness.   PAST MEDICAL HISTORY :  Past Medical History:  Diagnosis Date  . Anemia   . Asthma   . BPH (benign prostatic hyperplasia)   . COPD (chronic obstructive pulmonary disease) (HCC)    not on home oxygen  . Coronary artery disease   . Hypotension   . Hypothyroidism   . Kidney stones   . Skin abnormalities   . Skin cancer     PAST SURGICAL HISTORY :   Past Surgical History:  Procedure Laterality Date  . APPENDECTOMY    . BONE RESECTION     on nose  . MOHS SURGERY    . ORIF PATELLA Left 07/30/2016   Procedure: OPEN REDUCTION INTERNAL (ORIF) FIXATION PATELLA;  Surgeon: Hessie Knows, MD;  Location: [facility];  Service: Orthopedics;  Laterality: Left;  . ORIF PATELLA Left 08/15/2016   Procedure: OPEN REDUCTION INTERNAL (ORIF) FIXATION PATELLA;  Surgeon: Hessie Knows, MD;  Location: [facility];  Service: Orthopedics;  Laterality: Left;  . ORIF PATELLA Left 10/14/2016   Procedure: OPEN REDUCTION INTERNAL (ORIF) FIXATION PATELLA;  Surgeon: Hessie Knows, MD;  Location: [facility];  Service: Orthopedics;  Laterality: Left;  . TONSILLECTOMY      FAMILY  HISTORY :   Family History  Problem Relation Age of Onset  . CAD Other   . Diabetes Mother   . Prostate cancer Neg Hx   . Kidney cancer Neg Hx   . Bladder Cancer Neg Hx     SOCIAL HISTORY:   Social History  Substance Use Topics  . Smoking status: Never Smoker  . Smokeless tobacco: Never Used     Comment: tried cigarettes when young- but never really smoked  . Alcohol use No    ALLERGIES:  is allergic to ivp dye [iodinated diagnostic agents] and sulfa antibiotics.  MEDICATIONS:  Current Outpatient Prescriptions  Medication Sig Dispense Refill  . acetaminophen (TYLENOL) 325 MG tablet Take by mouth every 4 (four) hours as needed.    Marland Kitchen albuterol (PROVENTIL) (2.5 MG/3ML) 0.083% nebulizer solution Take 2.5 mg by nebulization every 6 (six) hours as needed for wheezing or shortness of breath.    Marland Kitchen alum & mag hydroxide-simeth (MYLANTA) 532-023-34 MG/5ML suspension Take 30 mLs by mouth every 6 (six) hours as needed for indigestion or heartburn.    Marland Kitchen aspirin EC 81 MG tablet Take 81 mg by mouth every morning.    . budesonide-formoterol (SYMBICORT) 80-4.5 MCG/ACT inhaler Inhale 2 puffs into the lungs 2 (two) times daily.    . feeding supplement, ENSURE ENLIVE, (ENSURE ENLIVE) LIQD Take 237 mLs by mouth 2 (two) times daily between meals. 237 mL 12  .  finasteride (PROSCAR) 5 MG tablet Take 5 mg by mouth daily.    Marland Kitchen FLUDROCORTISONE ACETATE PO Take 2 tablets by mouth daily.    Marland Kitchen lactulose (CHRONULAC) 10 GM/15ML solution Take 45 mLs (30 g total) by mouth daily as needed for moderate constipation. 473 mL 0  . levothyroxine (SYNTHROID, LEVOTHROID) 100 MCG tablet Take 1 tablet by mouth daily.    . megestrol (MEGACE) 40 MG tablet Take 40 mg by mouth daily.    . metoCLOPramide (REGLAN) 5 MG tablet Take 1 tablet (5 mg total) by mouth 4 (four) times daily -  before meals and at bedtime. 120 tablet 0  . midodrine (PROAMATINE) 5 MG tablet Take 2 tablets (10 mg total) by mouth 3 (three) times daily with  meals. 90 tablet 2  . Multiple Vitamin (MULTI-VITAMINS) TABS Take by mouth.    . ondansetron (ZOFRAN) 4 MG tablet Take 4 mg by mouth every 8 (eight) hours as needed for nausea or vomiting.    Marland Kitchen oxyCODONE (OXY IR/ROXICODONE) 5 MG immediate release tablet Take 1 tablet (5 mg total) by mouth every 4 (four) hours as needed for moderate pain or severe pain. 15 tablet 0  . polyethylene glycol (MIRALAX / GLYCOLAX) packet Take 17 g by mouth daily. 14 each 0  . sennosides-docusate sodium (SENOKOT-S) 8.6-50 MG tablet Take 2 tablets by mouth 2 (two) times daily.    . sertraline (ZOLOFT) 25 MG tablet Take 25 mg by mouth daily.    . tamsulosin (FLOMAX) 0.4 MG CAPS capsule Take 1 capsule (0.4 mg total) by mouth daily. 30 capsule   . Vitamin D, Ergocalciferol, (DRISDOL) 50000 units CAPS capsule Take 1 capsule by mouth every 7 (seven) days.     No current facility-administered medications for this visit.     PHYSICAL EXAMINATION:   BP 98/60   Pulse (!) 44   Temp 97.6 F (36.4 C) (Tympanic)   There were no vitals filed for this visit.  GENERAL: Cachectic-appearing Caucasian male patient. He is in a wheelchair. Accompanied by family; Alert, no distress and comfortable.    EYES: appears pale.  OROPHARYNX: no thrush or ulceration; poor dentition  NECK: supple, no masses felt LYMPH:  no palpable lymphadenopathy in the cervical, axillary or inguinal regions LUNGS: clear to auscultation and  No wheeze or crackles HEART/CVS: regular rate & rhythm and no murmurs; No lower extremity edema ABDOMEN:abdomen soft, non-tender and normal bowel sounds Musculoskeletal:no cyanosis of digits and no clubbing;  contractures present. Atrophic muscles.  PSYCH: alert & oriented x 3 with fluent speech NEURO: no focal motor/sensory deficits SKIN:  no rashes or significant lesions  LABORATORY DATA:  I have reviewed the data as listed    Component Value Date/Time   NA 135 03/07/2017 1219   NA 140 03/24/2015 0527   K  4.0 03/07/2017 1219   K 4.8 03/24/2015 0527   CL 101 03/07/2017 1219   CL 105 03/24/2015 0527   CO2 28 03/07/2017 1219   CO2 31 03/24/2015 0527   GLUCOSE 114 (H) 03/07/2017 1219   GLUCOSE 136 (H) 03/24/2015 0527   BUN 19 03/07/2017 1219   BUN 32 (H) 03/24/2015 0527   CREATININE 0.83 03/07/2017 1219   CREATININE 1.12 03/24/2015 0527   CALCIUM 8.4 (L) 03/07/2017 1219   CALCIUM 8.8 (L) 03/24/2015 0527   PROT 6.7 03/07/2017 1219   PROT 7.1 03/20/2014 0823   ALBUMIN 2.5 (L) 03/07/2017 1219   ALBUMIN 3.6 03/20/2014 0823   AST 28 03/07/2017  1219   AST 21 03/20/2014 0823   ALT 19 03/07/2017 1219   ALT 26 03/20/2014 0823   ALKPHOS 71 03/07/2017 1219   ALKPHOS 56 03/20/2014 0823   BILITOT 0.4 03/07/2017 1219   BILITOT 0.3 03/20/2014 0823   GFRNONAA >60 03/07/2017 1219   GFRNONAA >60 03/24/2015 0527   GFRAA >60 03/07/2017 1219   GFRAA >60 03/24/2015 0527    No results found for: SPEP, UPEP  Lab Results  Component Value Date   WBC 6.9 03/13/2017   NEUTROABS 3.7 03/13/2017   HGB 10.1 (L) 03/13/2017   HCT 30.2 (L) 03/13/2017   MCV 90.4 03/13/2017   PLT 460 (H) 03/13/2017      Chemistry      Component Value Date/Time   NA 135 03/07/2017 1219   NA 140 03/24/2015 0527   K 4.0 03/07/2017 1219   K 4.8 03/24/2015 0527   CL 101 03/07/2017 1219   CL 105 03/24/2015 0527   CO2 28 03/07/2017 1219   CO2 31 03/24/2015 0527   BUN 19 03/07/2017 1219   BUN 32 (H) 03/24/2015 0527   CREATININE 0.83 03/07/2017 1219   CREATININE 1.12 03/24/2015 0527      Component Value Date/Time   CALCIUM 8.4 (L) 03/07/2017 1219   CALCIUM 8.8 (L) 03/24/2015 0527   ALKPHOS 71 03/07/2017 1219   ALKPHOS 56 03/20/2014 0823   AST 28 03/07/2017 1219   AST 21 03/20/2014 0823   ALT 19 03/07/2017 1219   ALT 26 03/20/2014 0823   BILITOT 0.4 03/07/2017 1219   BILITOT 0.3 03/20/2014 0823       RADIOGRAPHIC STUDIES: I have personally reviewed the radiological images as listed and agreed with the  findings in the report. No results found.   ASSESSMENT & PLAN:  Iron deficiency anemia due to chronic blood loss # Iron deficiency versus anemia of chronic disease- Patient's CRP has been elevated; iron studies not classically suggestive of iron deficiency. Patient's anemia was thought to be from anemia of chronic disease- secondary to underlying infection versus other causes. No obvious blood loss noted. Given the low saturations recommend by mouth iron. Discussed the potential constipation.   # Discussed with the patient's family multiple other etiologies including bone marrow failure- including possible need for bone marrow biopsy- for further evaluation. However in the context of his poor overall health/multiple comorbidities recommend holding a bone marrow biopsy.  today hemoglobin is 10. Monitor for now.   # Recheck labs in 1 month/at NH; [facility]. Follow up in 3 months/iron studies lab.   # The above plan of care was discussed the patient/and his sister in law by the bedside. They agree.   Orders Placed This Encounter  Procedures  . CBC with Differential    Standing Status:   Future    Standing Expiration Date:   03/13/2018  . Comprehensive metabolic panel    Standing Status:   Future    Standing Expiration Date:   03/13/2018  . Ferritin    Standing Status:   Future    Standing Expiration Date:   03/13/2018  . Iron and TIBC    Standing Status:   Future    Standing Expiration Date:   03/13/2018   All questions were answered. The patient knows to call the clinic with any problems, questions or concerns.      Cammie Sickle, MD 03/14/2017 5:04 PM

## 2017-05-09 DEATH — deceased

## 2017-06-13 ENCOUNTER — Ambulatory Visit: Payer: Medicare Other | Admitting: Internal Medicine

## 2017-06-13 ENCOUNTER — Inpatient Hospital Stay: Payer: Medicare Other
